# Patient Record
Sex: Male | Born: 1950
Health system: Southern US, Community
[De-identification: ages and names within clinical notes are randomized; demographics above are authoritative.]

## PROBLEM LIST (undated history)

## (undated) DIAGNOSIS — T451X5A Adverse effect of antineoplastic and immunosuppressive drugs, initial encounter: Secondary | ICD-10-CM

## (undated) DIAGNOSIS — E119 Type 2 diabetes mellitus without complications: Secondary | ICD-10-CM

## (undated) DIAGNOSIS — R590 Localized enlarged lymph nodes: Secondary | ICD-10-CM

## (undated) DIAGNOSIS — C801 Malignant (primary) neoplasm, unspecified: Secondary | ICD-10-CM

## (undated) DIAGNOSIS — Q632 Ectopic kidney: Secondary | ICD-10-CM

## (undated) DIAGNOSIS — Z794 Long term (current) use of insulin: Secondary | ICD-10-CM

## (undated) DIAGNOSIS — K219 Gastro-esophageal reflux disease without esophagitis: Secondary | ICD-10-CM

## (undated) DIAGNOSIS — Z973 Presence of spectacles and contact lenses: Secondary | ICD-10-CM

## (undated) DIAGNOSIS — M1990 Unspecified osteoarthritis, unspecified site: Secondary | ICD-10-CM

## (undated) DIAGNOSIS — I10 Essential (primary) hypertension: Secondary | ICD-10-CM

## (undated) DIAGNOSIS — R351 Nocturia: Secondary | ICD-10-CM

## (undated) DIAGNOSIS — R918 Other nonspecific abnormal finding of lung field: Secondary | ICD-10-CM

## (undated) HISTORY — DX: Type 2 diabetes mellitus without complications: E11.9

## (undated) HISTORY — PX: APPENDECTOMY: SHX54

---

## 1976-11-28 HISTORY — PX: APPENDECTOMY: SHX54

## 2001-06-18 ENCOUNTER — Emergency Department (HOSPITAL_COMMUNITY): Admission: EM | Admit: 2001-06-18 | Discharge: 2001-06-18 | Payer: Self-pay

## 2006-02-04 ENCOUNTER — Emergency Department (HOSPITAL_COMMUNITY): Admission: EM | Admit: 2006-02-04 | Discharge: 2006-02-04 | Payer: Self-pay | Admitting: Family Medicine

## 2006-02-18 ENCOUNTER — Emergency Department (HOSPITAL_COMMUNITY): Admission: EM | Admit: 2006-02-18 | Discharge: 2006-02-18 | Payer: Self-pay | Admitting: Emergency Medicine

## 2006-06-30 ENCOUNTER — Emergency Department (HOSPITAL_COMMUNITY): Admission: EM | Admit: 2006-06-30 | Discharge: 2006-07-01 | Payer: Self-pay | Admitting: Emergency Medicine

## 2009-10-21 ENCOUNTER — Emergency Department (HOSPITAL_COMMUNITY): Admission: EM | Admit: 2009-10-21 | Discharge: 2009-10-21 | Payer: Self-pay | Admitting: Emergency Medicine

## 2011-01-08 ENCOUNTER — Inpatient Hospital Stay (INDEPENDENT_AMBULATORY_CARE_PROVIDER_SITE_OTHER)
Admission: RE | Admit: 2011-01-08 | Discharge: 2011-01-08 | Disposition: A | Discharge status: Home / Self Care | Payer: Managed Care, Other (non HMO) | Source: Ambulatory Visit | Attending: Emergency Medicine | Admitting: Emergency Medicine

## 2011-01-08 DIAGNOSIS — J40 Bronchitis, not specified as acute or chronic: Secondary | ICD-10-CM

## 2011-01-08 DIAGNOSIS — R319 Hematuria, unspecified: Secondary | ICD-10-CM

## 2011-01-08 LAB — POCT URINALYSIS DIPSTICK
Nitrite: NEGATIVE
Urine Glucose, Fasting: NEGATIVE mg/dL
Urobilinogen, UA: 0.2 mg/dL (ref 0.0–1.0)
pH: 5.5 (ref 5.0–8.0)

## 2011-03-02 LAB — CBC
MCHC: 32.1 g/dL (ref 30.0–36.0)
MCV: 77 fL — ABNORMAL LOW (ref 78.0–100.0)
Platelets: 186 10*3/uL (ref 150–400)
WBC: 6.3 10*3/uL (ref 4.0–10.5)

## 2011-03-02 LAB — DIFFERENTIAL
Eosinophils Relative: 3 % (ref 0–5)
Lymphocytes Relative: 32 % (ref 12–46)
Lymphs Abs: 2 10*3/uL (ref 0.7–4.0)
Monocytes Absolute: 1.3 10*3/uL — ABNORMAL HIGH (ref 0.1–1.0)
Monocytes Relative: 20 % — ABNORMAL HIGH (ref 3–12)

## 2011-03-02 LAB — COMPREHENSIVE METABOLIC PANEL
AST: 35 U/L (ref 0–37)
Albumin: 3.6 g/dL (ref 3.5–5.2)
Calcium: 9 mg/dL (ref 8.4–10.5)
Creatinine, Ser: 1.16 mg/dL (ref 0.4–1.5)
GFR calc Af Amer: >60 mL/min (ref >60)

## 2011-03-02 LAB — LIPASE, BLOOD: Lipase: 127 U/L — ABNORMAL HIGH (ref 11–59)

## 2012-10-13 ENCOUNTER — Emergency Department (HOSPITAL_COMMUNITY): Payer: BC Managed Care – PPO

## 2012-10-13 ENCOUNTER — Encounter (HOSPITAL_COMMUNITY): Payer: Self-pay

## 2012-10-13 ENCOUNTER — Emergency Department (HOSPITAL_COMMUNITY)
Admission: EM | Admit: 2012-10-13 | Discharge: 2012-10-13 | Disposition: A | Discharge status: Home / Self Care | Payer: BC Managed Care – PPO | Attending: Emergency Medicine | Admitting: Emergency Medicine

## 2012-10-13 DIAGNOSIS — R51 Headache: Secondary | ICD-10-CM | POA: Insufficient documentation

## 2012-10-13 DIAGNOSIS — I10 Essential (primary) hypertension: Secondary | ICD-10-CM

## 2012-10-13 DIAGNOSIS — R0602 Shortness of breath: Secondary | ICD-10-CM | POA: Insufficient documentation

## 2012-10-13 DIAGNOSIS — M129 Arthropathy, unspecified: Secondary | ICD-10-CM | POA: Insufficient documentation

## 2012-10-13 HISTORY — DX: Essential (primary) hypertension: I10

## 2012-10-13 LAB — POCT I-STAT, CHEM 8
Creatinine, Ser: 1.3 mg/dL (ref 0.50–1.35)
Glucose, Bld: 105 mg/dL — ABNORMAL HIGH (ref 70–99)
Hemoglobin: 16.7 g/dL (ref 13.0–17.0)
Potassium: 4.1 mEq/L (ref 3.5–5.1)

## 2012-10-13 LAB — CBC WITH DIFFERENTIAL/PLATELET
Basophils Relative: 0 % (ref 0–1)
HCT: 45.5 % (ref 39.0–52.0)
Hemoglobin: 14.9 g/dL (ref 13.0–17.0)
MCH: 24.5 pg — ABNORMAL LOW (ref 26.0–34.0)
MCHC: 32.7 g/dL (ref 30.0–36.0)
Monocytes Absolute: 0.5 10*3/uL (ref 0.1–1.0)
Monocytes Relative: 9 % (ref 3–12)
Neutro Abs: 1.8 10*3/uL (ref 1.7–7.7)

## 2012-10-13 LAB — POCT I-STAT TROPONIN I: Troponin i, poc: 0 ng/mL (ref 0.00–0.08)

## 2012-10-13 MED ORDER — IOHEXOL 300 MG/ML  SOLN
75.0000 mL | Freq: Once | INTRAMUSCULAR | Status: AC | PRN
  Administered 2012-10-13: 75 mL via INTRAVENOUS

## 2012-10-13 MED ORDER — HYDROCHLOROTHIAZIDE 12.5 MG PO TABS: 12.5000 mg | ORAL_TABLET | Freq: Every day | ORAL | Status: DC

## 2012-10-13 MED ORDER — SODIUM CHLORIDE 0.9 % IV SOLN: INTRAVENOUS | Status: DC

## 2012-10-13 MED ORDER — SODIUM CHLORIDE 0.9 % IV BOLUS (SEPSIS)
500.0000 mL | Freq: Once | INTRAVENOUS | Status: AC
  Administered 2012-10-13: 500 mL via INTRAVENOUS

## 2012-10-13 NOTE — ED Provider Notes (Signed)
History     CSN: [id number]  Arrival date & time 10/13/12  0818   First MD Initiated Contact with Patient 10/13/12 [phone]      Chief Complaint  Patient presents with  . Hypertension    (Consider location/radiation/quality/duration/timing/severity/associated sxs/prior treatment) HPI This 61 year old male complains of 3 weeks of fluctuating blood pressure without history of hypertension with blood pressures running as high as the 160s over the low 100s, he was told last night to come to the emergency department so he presents today for evaluation. He states the last 3 weeks he has had mild intermittent gradual onset headaches without any change in speech or vision or focal weakness or numbness or vertigo or altered mental status. He also does have a last few weeks he feels short of breath in his neck area 24/7 without drooling or stridor or pain or dysphagia. He denies any chest pain or chest pressure. Denies abdominal pain. He denies waking or edema. He is no sudden or severe headaches.  He does not know if he has snoring or apnea at night but feels short of breath at daytime and nighttime 24 hours a day. He tried an old inhaler for when he had an episode of bronchospasm and that is really not changed his shortness of breath feeling. For shortness of breath does not feel as if it is in his chest area but more so in his neck area and is very mild and he is able to speak full sentences without difficulty. There is no treatment prior to arrival.  Past medical history: Arthritis  History reviewed. No pertinent past surgical history.  History reviewed. No pertinent family history.  History  Substance Use Topics  . Smoking status: Never Smoker   . Smokeless tobacco: Not on file  . Alcohol Use: Yes      Review of Systems 10 Systems reviewed and are negative for acute change except as noted in the HPI. Allergies  Quinine derivatives  Home Medications   Current Outpatient Rx  Name   Route  Sig  Dispense  Refill  . GLUCOSAMINE PO   Oral   Take 1 tablet by mouth every other day.         . IBUPROFEN 200 MG PO TABS   Oral   Take 600 mg by mouth every 6 (six) hours as needed. For pain         . HYDROCHLOROTHIAZIDE 12.5 MG PO TABS   Oral   Take 1 tablet (12.5 mg total) by mouth daily.   30 tablet   0     BP 134/87  Pulse 59  Temp 97.2 F (36.2 C) (Oral)  Resp 16  SpO2 100%  Physical Exam  Nursing note and vitals reviewed. Constitutional:       Awake, alert, nontoxic appearance with baseline speech for patient.  HENT:  Head: Atraumatic.  Mouth/Throat: Oropharynx is clear and moist. No oropharyngeal exudate.  Eyes: EOM are normal. Pupils are equal, round, and reactive to light. Right eye exhibits no discharge. Left eye exhibits no discharge.  Neck: Neck supple.       Patient's subjective feeling of shortness of breath he is in his neck area and he does not have inspiratory stridor but seems to have upper airway expiratory wheezes not in his chest but only heard from his oropharynx area.  There is no drooling and he is able to speak full sentences without difficulty. He has normal pulse oximetry on room air.  Cardiovascular:  Normal rate and regular rhythm.   No murmur heard. Pulmonary/Chest: Effort normal and breath sounds normal. No stridor. No respiratory distress. He has no wheezes. He has no rales. He exhibits no tenderness.  Abdominal: Soft. Bowel sounds are normal. He exhibits no mass. There is no tenderness. There is no rebound.  Musculoskeletal: He exhibits no tenderness.       Baseline ROM, moves extremities with no obvious new focal weakness.  Lymphadenopathy:    He has no cervical adenopathy.  Neurological: He is alert.       Awake, alert, cooperative and aware of situation; motor strength bilaterally; sensation normal to light touch bilaterally; peripheral visual fields full to confrontation; no facial asymmetry; tongue midline; major cranial  nerves appear intact; no pronator drift, normal finger to nose bilaterally  Skin: No rash noted.  Psychiatric: He has a normal mood and affect.    ED Course  Procedures (including critical care time)  ECG: Normal sinus rhythm, ventricular rate 61, normal axis, normal intervals, no acute ischemic changes noted, impression normal ECG, no comparison ECG available   I doubt hypertensive crisis although given his symptoms we will check CT scan brain to rule out bleed, CT scan neck to rule out obstructive lesion, labs and ECG, the patient was moved to the CDU and I anticipate discharge if his evaluation is unremarkable. Patient / Family / Caregiver understand and agree with initial ED impression and plan with expectations set for ED visit.  Medical screening examination/treatment/procedure(s) were conducted as a shared visit with non-physician practitioner(s) and myself.  I personally evaluated the patient during the encounter move to CDU. Labs Reviewed  CBC WITH DIFFERENTIAL - Abnormal; Notable for the following:    RBC 6.08 (*)     MCV 74.8 (*)     MCH 24.5 (*)     Platelets 149 (*)     Neutrophils Relative 33 (*)     Lymphocytes Relative 52 (*)     All other components within normal limits  POCT I-STAT, CHEM 8 - Abnormal; Notable for the following:    Glucose, Bld 105 (*)     All other components within normal limits  PRO B NATRIURETIC PEPTIDE  POCT I-STAT TROPONIN I  POCT I-STAT TROPONIN I   Dg Chest 2 View  10/13/2012  *RADIOLOGY REPORT*  Clinical Data: Headache for 2 weeks.  Hypertension.  CHEST - 2 VIEW  Comparison: 10/21/2009  Findings: The heart, mediastinum and hila are unremarkable.  The lungs are clear.  The bony thorax is intact.  No change from the prior study.  IMPRESSION: No active disease of the chest.   Original Report Authenticated By: Amie Portland, M.D.    Ct Head Wo Contrast  10/13/2012  *RADIOLOGY REPORT*  Clinical Data: Headache, hypertension  CT HEAD WITHOUT  CONTRAST  Technique:  Contiguous axial images were obtained from the base of the skull through the vertex without contrast.  Comparison: None.  Findings: No evidence of parenchymal hemorrhage or extra-axial fluid collection. No mass lesion, mass effect, or midline shift.  No CT evidence of acute infarction.  Mild subcortical white matter and periventricular small vessel ischemic changes.  Cerebral volume is age appropriate.  No ventriculomegaly.  The visualized paranasal sinuses are essentially clear. The mastoid air cells are unopacified.  No evidence of calvarial fracture.  IMPRESSION: No evidence of acute intracranial abnormality.   Original Report Authenticated By: Charline Bills, M.D.    Ct Soft Tissue Neck W Contrast  10/13/2012  *  RADIOLOGY REPORT*  Clinical Data: Dysphagia.  Sensation of something caught in throat.  CT NECK WITH CONTRAST  Technique:  Multidetector CT imaging of the neck was performed with intravenous contrast.  Contrast: 75mL OMNIPAQUE IOHEXOL 300 MG/ML  SOLN  Comparison: None.  Findings: Visualized lungs clear.  Calcification left coronary artery.  Minimal calcification left carotid bifurcation.  Mild cervical spondylotic changes with spinal stenosis most notable C3-4 where there is slight cord flattening.  Calcification palatine tonsils consistent with prior inflammation. No discrete palatine tonsil mass is identified.  If the patient has persistent symptoms, ENT evaluation to exclude mucosal abnormality may be considered.  Mild prominence soft palate probably normal.  No abnormality of the epiglottis, aryepiglottic folds or piriform sinus.  No glottic or subglottic lesion noted.  No thyroid, submandibular, parotid or tongue base mass identified.  Minimal mucosal thickening the left maxillary sinus.  Scattered normal sized lymph nodes.  No adenopathy.  Visualized aspect of the upper thoracic and cervical esophagus without mass or radiopaque foreign body.  IMPRESSION: No cause of  patient's symptoms detected.  Please see above discussion.  Left coronary artery calcification.  Spinal stenosis C3-4.   Original Report Authenticated By: Lacy Duverney, M.D.      1. Hypertension       MDM          Hurman Horn, MD 10/13/12 2159

## 2012-10-13 NOTE — ED Notes (Signed)
Pt reports blood pressure "going  Crazy"  Elevated, feeling woozy, and not himself

## 2012-10-13 NOTE — ED Notes (Signed)
Patient transported to X-ray 

## 2012-10-13 NOTE — ED Provider Notes (Signed)
1000  Report received from Dr. Carlos American B for this 61 yo here today c/o hypertension, SOB and h/a but no stroke symptoms.  PEARL, MAE=.  Coordination and speech in tact.  No SOB presently. O 2 sat 100% on r/a.  Lungs clear. Non toxic appearance.   1300 Labs unremarkable.  Trop - x 2.  EKg nsr with no changes.  CT head and neck normal and reviewed by myself.  Patient is ready for discharge.  Instructed to follow up with Dr. Candis Musa this week.  Rx for hydrochlorothizide 12.5 daily.  He will keep a log of b/p and take to appointment with Dr. Candis Musa.  Stop all salt intake and limit caffeine.  Patient and family agree with plan and are ready for discharge.   Labs Reviewed  CBC WITH DIFFERENTIAL - Abnormal; Notable for the following:    RBC 6.08 (*)     MCV 74.8 (*)     MCH 24.5 (*)     Platelets 149 (*)     Neutrophils Relative 33 (*)     Lymphocytes Relative 52 (*)     All other components within normal limits  POCT I-STAT, CHEM 8 - Abnormal; Notable for the following:    Glucose, Bld 105 (*)     All other components within normal limits  PRO B NATRIURETIC PEPTIDE  POCT I-STAT TROPONIN I  POCT I-STAT TROPONIN I  LAB REPORT - SCANNED    Date: 10/13/2012  Rate: 61  Rhythm: normal sinus rhythm  QRS Axis: normal  Intervals: normal  ST/T Wave abnormalities: normal  Conduction Disutrbances:none  Narrative Interpretation:   Old EKG Reviewed: unchanged   Remi Haggard, NP 10/14/12 1727

## 2015-05-18 ENCOUNTER — Emergency Department (INDEPENDENT_AMBULATORY_CARE_PROVIDER_SITE_OTHER)
Admission: EM | Admit: 2015-05-18 | Discharge: 2015-05-18 | Disposition: A | Discharge status: Home / Self Care | Payer: 59 | Source: Home / Self Care | Attending: Family Medicine | Admitting: Family Medicine

## 2015-05-18 ENCOUNTER — Encounter (HOSPITAL_COMMUNITY): Payer: Self-pay | Admitting: Emergency Medicine

## 2015-05-18 DIAGNOSIS — I10 Essential (primary) hypertension: Secondary | ICD-10-CM

## 2015-05-18 LAB — POCT I-STAT, CHEM 8
BUN: 10 mg/dL (ref 6–20)
CALCIUM ION: 1.15 mmol/L (ref 1.13–1.30)
CREATININE: 1.2 mg/dL (ref 0.61–1.24)
Chloride: 104 mmol/L (ref 101–111)
GLUCOSE: 91 mg/dL (ref 65–99)
HCT: 47 % (ref 39.0–52.0)
HEMOGLOBIN: 16 g/dL (ref 13.0–17.0)
Potassium: 3.6 mmol/L (ref 3.5–5.1)
Sodium: 140 mmol/L (ref 135–145)
TCO2: 24 mmol/L (ref 0–100)

## 2015-05-18 MED ORDER — LISINOPRIL-HYDROCHLOROTHIAZIDE 20-25 MG PO TABS: 1.0000 | ORAL_TABLET | Freq: Every day | ORAL | Status: DC

## 2015-05-18 NOTE — ED Provider Notes (Signed)
Linn Goetze is a 64 y.o. male who presents to Urgent Care today for blood pressure medication refill. Patient ran out of his blood pressure medicine about 4 days ago. His doctor is on medical leave and not responding to request for refill. He feels well with no chest pains palpitations or shortness of breath.   Past Medical History  Diagnosis Date  . Hypertension    History reviewed. No pertinent past surgical history. History  Substance Use Topics  . Smoking status: Never Smoker   . Smokeless tobacco: Not on file  . Alcohol Use: Yes   ROS as above Medications: No current facility-administered medications for this encounter.   Current Outpatient Prescriptions  Medication Sig Dispense Refill  . GLUCOSAMINE PO Take 1 tablet by mouth every other day.    . ibuprofen (ADVIL,MOTRIN) 200 MG tablet Take 600 mg by mouth every 6 (six) hours as needed. For pain    . lisinopril-hydrochlorothiazide (PRINZIDE,ZESTORETIC) 20-25 MG per tablet Take 1 tablet by mouth daily. 30 tablet 0  . [DISCONTINUED] hydrochlorothiazide (HYDRODIURIL) 12.5 MG tablet Take 1 tablet (12.5 mg total) by mouth daily. 30 tablet 0   Allergies  Allergen Reactions  . Quinine Derivatives Itching     Exam:  BP 140/85 mmHg  Pulse 74  Temp(Src) 98.5 F (36.9 C) (Oral)  Resp 16  SpO2 97% Gen: Well NAD HEENT: EOMI,  MMM Lungs: Normal work of breathing. CTABL Heart: RRR no MRG Abd: NABS, Soft. Nondistended, Nontender Exts: Brisk capillary refill, warm and well perfused.   Results for orders placed or performed during the hospital encounter of 05/18/15 (from the past 24 hour(s))  I-STAT, chem 8     Status: None   Collection Time: 05/18/15  5:44 PM  Result Value Ref Range   Sodium 140 135 - 145 mmol/L   Potassium 3.6 3.5 - 5.1 mmol/L   Chloride 104 101 - 111 mmol/L   BUN 10 6 - 20 mg/dL   Creatinine, Ser 1.20 0.61 - 1.24 mg/dL   Glucose, Bld 91 65 - 99 mg/dL   Calcium, Ion 1.15 1.13 - 1.30 mmol/L   TCO2 24 0  - 100 mmol/L   Hemoglobin 16.0 13.0 - 17.0 g/dL   HCT 47.0 39.0 - 52.0 %   No results found.  Assessment and Plan: 64 y.o. male with hypertension. Refill hydrochlorothiazide/lisinopril. Follow-up with PCP.  Discussed warning signs or symptoms. Please see discharge instructions. Patient expresses understanding.     Gregor Hams, MD 05/18/15 [phone]

## 2015-05-18 NOTE — Discharge Instructions (Signed)
Thank you for coming in today. Call or go to the emergency room if you get worse, have trouble breathing, have chest pains, or palpitations.  Follow up with your doctor.    Hypertension Hypertension, commonly called high blood pressure, is when the force of blood pumping through your arteries is too strong. Your arteries are the blood vessels that carry blood from your heart throughout your body. A blood pressure reading consists of a higher number over a lower number, such as 110/72. The higher number (systolic) is the pressure inside your arteries when your heart pumps. The lower number (diastolic) is the pressure inside your arteries when your heart relaxes. Ideally you want your blood pressure below 120/80. Hypertension forces your heart to work harder to pump blood. Your arteries may become narrow or stiff. Having hypertension puts you at risk for heart disease, stroke, and other problems.  RISK FACTORS Some risk factors for high blood pressure are controllable. Others are not.  Risk factors you cannot control include:   Race. You may be at higher risk if you are African American.  Age. Risk increases with age.  Gender. Men are at higher risk than women before age 56 years. After age 52, women are at higher risk than men. Risk factors you can control include:  Not getting enough exercise or physical activity.  Being overweight.  Getting too much fat, sugar, calories, or salt in your diet.  Drinking too much alcohol. SIGNS AND SYMPTOMS Hypertension does not usually cause signs or symptoms. Extremely high blood pressure (hypertensive crisis) may cause headache, anxiety, shortness of breath, and nosebleed. DIAGNOSIS  To check if you have hypertension, your health care provider will measure your blood pressure while you are seated, with your arm held at the level of your heart. It should be measured at least twice using the same arm. Certain conditions can cause a difference in blood  pressure between your right and left arms. A blood pressure reading that is higher than normal on one occasion does not mean that you need treatment. If one blood pressure reading is high, ask your health care provider about having it checked again. TREATMENT  Treating high blood pressure includes making lifestyle changes and possibly taking medicine. Living a healthy lifestyle can help lower high blood pressure. You may need to change some of your habits. Lifestyle changes may include:  Following the DASH diet. This diet is high in fruits, vegetables, and whole grains. It is low in salt, red meat, and added sugars.  Getting at least 2 hours of brisk physical activity every week.  Losing weight if necessary.  Not smoking.  Limiting alcoholic beverages.  Learning ways to reduce stress. If lifestyle changes are not enough to get your blood pressure under control, your health care provider may prescribe medicine. You may need to take more than one. Work closely with your health care provider to understand the risks and benefits. HOME CARE INSTRUCTIONS  Have your blood pressure rechecked as directed by your health care provider.   Take medicines only as directed by your health care provider. Follow the directions carefully. Blood pressure medicines must be taken as prescribed. The medicine does not work as well when you skip doses. Skipping doses also puts you at risk for problems.   Do not smoke.   Monitor your blood pressure at home as directed by your health care provider. SEEK MEDICAL CARE IF:   You think you are having a reaction to medicines taken.  You have recurrent headaches or feel dizzy.  You have swelling in your ankles.  You have trouble with your vision. SEEK IMMEDIATE MEDICAL CARE IF:  You develop a severe headache or confusion.  You have unusual weakness, numbness, or feel faint.  You have severe chest or abdominal pain.  You vomit repeatedly.  You have  trouble breathing. MAKE SURE YOU:   Understand these instructions.  Will watch your condition.  Will get help right away if you are not doing well or get worse. Document Released: 11/14/2005 Document Revised: 03/31/2014 Document Reviewed: 09/06/2013 [facility] Patient Information 2015 [place], [place]. This information is not intended to replace advice given to you by your health care provider. Make sure you discuss any questions you have with your health care provider.

## 2015-05-18 NOTE — ED Notes (Signed)
Pt has been out of his BP medication since last Thursday.  Pt's PCP is out on medical leave and he is not able to fill his Rx at this time.

## 2015-08-10 ENCOUNTER — Emergency Department (INDEPENDENT_AMBULATORY_CARE_PROVIDER_SITE_OTHER)
Admission: EM | Admit: 2015-08-10 | Discharge: 2015-08-10 | Disposition: A | Discharge status: Home / Self Care | Payer: 59 | Source: Home / Self Care | Attending: Family Medicine | Admitting: Family Medicine

## 2015-08-10 ENCOUNTER — Encounter (HOSPITAL_COMMUNITY): Payer: Self-pay | Admitting: Emergency Medicine

## 2015-08-10 DIAGNOSIS — I10 Essential (primary) hypertension: Secondary | ICD-10-CM | POA: Diagnosis not present

## 2015-08-10 MED ORDER — LISINOPRIL-HYDROCHLOROTHIAZIDE 20-25 MG PO TABS: 1.0000 | ORAL_TABLET | Freq: Every day | ORAL | Status: DC

## 2015-08-10 NOTE — ED Notes (Signed)
Pt is currently out of his BP medication and in between doctors.  He has been out of his medication for 3 weeks.  Pt reports no issues.

## 2015-08-10 NOTE — Discharge Instructions (Signed)
Refilled her blood pressure medicine. Please follow-up with [facility] [phone]) family medicine hearing [place] or at [facility] [place] urgent [facility] if you need further medical care. He can always go to emergency room as well if needed.

## 2015-08-10 NOTE — ED Provider Notes (Addendum)
CSN: [id number]     Arrival date & time 08/10/15  1310 History   First MD Initiated Contact with Patient 08/10/15 1417     Chief Complaint  Patient presents with  . Medication Refill   (Consider location/radiation/quality/duration/timing/severity/associated sxs/prior Treatment) HPI  HTN: chronic. On BP medications but ran out 3 wks ago. Denies CP, SOB, palpitations, nausea, vomiting, Neck stiffness, HA. Patient states that his primary care doctor initially had to leave do to personal medical reasons and then try calling again the other day and his staff said that he was now no longer coming back but had decided to leave permanently. He no longer has a primary care physician. BP check the other day at home was 159/99.  Problem is constant   Past Medical History  Diagnosis Date  . Hypertension    History reviewed. No pertinent past surgical history. History reviewed. No pertinent family history. Social History  Substance Use Topics  . Smoking status: Never Smoker   . Smokeless tobacco: None  . Alcohol Use: Yes    Review of Systems Per HPI with all other pertinent systems negative.   Allergies  Quinine derivatives  Home Medications   Prior to Admission medications   Medication Sig Start Date End Date Taking? Authorizing Provider  GLUCOSAMINE PO Take 1 tablet by mouth every other day.    Historical Provider, MD  ibuprofen (ADVIL,MOTRIN) 200 MG tablet Take 600 mg by mouth every 6 (six) hours as needed. For pain    Historical Provider, MD  lisinopril-hydrochlorothiazide (PRINZIDE,ZESTORETIC) 20-25 MG per tablet Take 1 tablet by mouth daily. 08/10/15   Waldemar Dickens, MD   Meds Ordered and Administered this Visit  Medications - No data to display  BP 130/85 mmHg  Pulse 76  Temp(Src) 98.4 F (36.9 C) (Oral)  Resp 24  SpO2 99% No data found.   Physical Exam Physical Exam  Constitutional: oriented to person, place, and time. appears well-developed and well-nourished. No  distress.  HENT:  Head: Normocephalic and atraumatic.  Eyes: EOMI. PERRL.  Neck: Normal range of motion.  Cardiovascular: RRR, no m/r/g, 2+ distal pulses,  Pulmonary/Chest: Effort normal and breath sounds normal. No respiratory distress.  Abdominal: Soft. Bowel sounds are normal. NonTTP, no distension.  Musculoskeletal: Normal range of motion. Non ttp, no effusion.  Neurological: alert and oriented to person, place, and time.  Skin: Skin is warm. No rash noted. non diaphoretic.  Psychiatric: normal mood and affect. behavior is normal. Judgment and thought content normal. '  ED Course  Procedures (including critical care time)  Labs Review Labs Reviewed - No data to display  Imaging Review No results found.   Visual Acuity Review  Right Eye Distance:   Left Eye Distance:   Bilateral Distance:    Right Eye Near:   Left Eye Near:    Bilateral Near:         MDM   1. Essential hypertension    Refills for lisinopril and HCTZ   Patient follow-up with [facility] primary care or [place]  Waldemar Dickens, MD 08/10/15 [place], MD 08/10/15 1431

## 2015-12-29 ENCOUNTER — Encounter: Payer: 59 | Attending: Internal Medicine

## 2015-12-29 VITALS — Ht 69.0 in | Wt 255.0 lb

## 2015-12-29 DIAGNOSIS — E119 Type 2 diabetes mellitus without complications: Secondary | ICD-10-CM

## 2015-12-29 DIAGNOSIS — E1165 Type 2 diabetes mellitus with hyperglycemia: Secondary | ICD-10-CM | POA: Insufficient documentation

## 2016-01-01 NOTE — Progress Notes (Signed)
Patient was seen on 12/29/15 for the first of a series of three diabetes self-management courses at the [facility].  Patient Education Plan per assessed needs and concerns is to attend four course education program for Diabetes Self Management Education.  The following learning objectives were met by the patient during this class:  Describe diabetes  State some common risk factors for diabetes  Defines the role of glucose and insulin  Identifies type of diabetes and pathophysiology  Describe the relationship between diabetes and cardiovascular risk  State the members of the Healthcare Team  States the rationale for glucose monitoring  State when to test glucose  State their individual Target Range  State the importance of logging glucose readings  Describe how to interpret glucose readings  Identifies A1C target  Explain the correlation between A1c and eAG values  State symptoms and treatment of high blood glucose  State symptoms and treatment of low blood glucose  Explain proper technique for glucose testing  Identifies proper sharps disposal  Handouts given during class include:  Living Well with Diabetes book  Carb Counting and Meal Planning book  Meal Plan Card  Carbohydrate guide  Meal planning worksheet  Low Sodium Flavoring Tips  The diabetes portion plate  V9D to eAG Conversion Chart  Diabetes Medications  Diabetes Recommended Care Schedule  Support Group  Diabetes Success Plan  Core Class Satisfaction Survey  Follow-Up Plan:  Attend core 2

## 2016-01-05 ENCOUNTER — Encounter: Payer: 59 | Attending: Internal Medicine

## 2016-01-05 DIAGNOSIS — E119 Type 2 diabetes mellitus without complications: Secondary | ICD-10-CM

## 2016-01-05 DIAGNOSIS — E1165 Type 2 diabetes mellitus with hyperglycemia: Secondary | ICD-10-CM | POA: Diagnosis not present

## 2016-01-05 NOTE — Progress Notes (Signed)

## 2016-01-12 DIAGNOSIS — E1165 Type 2 diabetes mellitus with hyperglycemia: Secondary | ICD-10-CM | POA: Diagnosis not present

## 2016-01-12 DIAGNOSIS — E119 Type 2 diabetes mellitus without complications: Secondary | ICD-10-CM

## 2016-01-14 NOTE — Progress Notes (Signed)
Patient was seen on 01/12/16 for the third of a series of three diabetes self-management courses at the [facility].   [place] the amount of activity recommended for healthy living . Describe activities suitable for individual needs . Identify ways to regularly incorporate activity into daily life . Identify barriers to activity and ways to over come these barriers  Identify diabetes medications being personally used and their primary action for lowering glucose and possible side effects . Describe role of stress on blood glucose and develop strategies to address psychosocial issues . Identify diabetes complications and ways to prevent them  Explain how to manage diabetes during illness . Evaluate success in meeting personal goal . Establish 2-3 goals that they will plan to diligently work on until they return for the  57-month follow-up visit  Goals:   I will count my carb choices at most meals and snacks  I will be active 30 minutes or more 5 times a week  I will take my diabetes medications as scheduled  I will eat less unhealthy fats   I will test my glucose at least 1 times a day, 7 days a week  I will look at patterns in my record book at least 1 days a month  To help manage stress I will  Exercise  at least 5 times a week  Your patient has identified these potential barriers to change:  Stress  Your patient has identified their diabetes self-care support plan as  [employer] Support Group Plan:  Attend Optional Core 4 in 4 months

## 2016-09-20 ENCOUNTER — Ambulatory Visit
Admission: RE | Admit: 2016-09-20 | Discharge: 2016-09-20 | Disposition: A | Discharge status: Home / Self Care | Payer: 59 | Source: Ambulatory Visit | Attending: Internal Medicine | Admitting: Internal Medicine

## 2016-09-20 ENCOUNTER — Other Ambulatory Visit: Payer: Self-pay | Admitting: Internal Medicine

## 2016-09-20 DIAGNOSIS — R05 Cough: Secondary | ICD-10-CM

## 2016-09-20 DIAGNOSIS — R059 Cough, unspecified: Secondary | ICD-10-CM

## 2016-10-28 ENCOUNTER — Other Ambulatory Visit: Payer: Self-pay | Admitting: Gastroenterology

## 2016-11-28 DIAGNOSIS — C187 Malignant neoplasm of sigmoid colon: Secondary | ICD-10-CM

## 2016-11-28 HISTORY — DX: Malignant neoplasm of sigmoid colon: C18.7

## 2016-12-15 ENCOUNTER — Encounter (HOSPITAL_COMMUNITY): Payer: Self-pay

## 2016-12-19 ENCOUNTER — Ambulatory Visit (HOSPITAL_COMMUNITY)
Admission: RE | Admit: 2016-12-19 | Discharge: 2016-12-19 | Disposition: A | Discharge status: Home / Self Care | Payer: 59 | Source: Ambulatory Visit | Attending: Gastroenterology | Admitting: Gastroenterology

## 2016-12-19 ENCOUNTER — Ambulatory Visit (HOSPITAL_COMMUNITY): Payer: 59 | Admitting: Anesthesiology

## 2016-12-19 ENCOUNTER — Encounter (HOSPITAL_COMMUNITY)
Admission: RE | Disposition: A | Discharge status: Home / Self Care | Payer: Self-pay | Source: Ambulatory Visit | Attending: Gastroenterology

## 2016-12-19 ENCOUNTER — Encounter (HOSPITAL_COMMUNITY): Payer: Self-pay | Admitting: *Deleted

## 2016-12-19 DIAGNOSIS — C186 Malignant neoplasm of descending colon: Secondary | ICD-10-CM | POA: Insufficient documentation

## 2016-12-19 DIAGNOSIS — K219 Gastro-esophageal reflux disease without esophagitis: Secondary | ICD-10-CM | POA: Insufficient documentation

## 2016-12-19 DIAGNOSIS — D125 Benign neoplasm of sigmoid colon: Secondary | ICD-10-CM | POA: Insufficient documentation

## 2016-12-19 DIAGNOSIS — I10 Essential (primary) hypertension: Secondary | ICD-10-CM | POA: Diagnosis not present

## 2016-12-19 DIAGNOSIS — Z6838 Body mass index (BMI) 38.0-38.9, adult: Secondary | ICD-10-CM | POA: Diagnosis not present

## 2016-12-19 DIAGNOSIS — Z1211 Encounter for screening for malignant neoplasm of colon: Secondary | ICD-10-CM | POA: Diagnosis not present

## 2016-12-19 DIAGNOSIS — Z9049 Acquired absence of other specified parts of digestive tract: Secondary | ICD-10-CM | POA: Insufficient documentation

## 2016-12-19 DIAGNOSIS — E119 Type 2 diabetes mellitus without complications: Secondary | ICD-10-CM | POA: Diagnosis not present

## 2016-12-19 HISTORY — PX: COLONOSCOPY WITH PROPOFOL: SHX5780

## 2016-12-19 LAB — GLUCOSE, CAPILLARY: Glucose-Capillary: 132 mg/dL — ABNORMAL HIGH (ref 65–99)

## 2016-12-19 SURGERY — COLONOSCOPY WITH PROPOFOL
Anesthesia: Monitor Anesthesia Care

## 2016-12-19 MED ORDER — LIDOCAINE 2% (20 MG/ML) 5 ML SYRINGE
INTRAMUSCULAR | Status: AC
  Filled 2016-12-19: qty 5

## 2016-12-19 MED ORDER — PROPOFOL 10 MG/ML IV BOLUS
INTRAVENOUS | Status: AC
  Filled 2016-12-19: qty 20

## 2016-12-19 MED ORDER — SODIUM CHLORIDE 0.9 % IV SOLN: INTRAVENOUS | Status: DC

## 2016-12-19 MED ORDER — PROPOFOL 500 MG/50ML IV EMUL
INTRAVENOUS | Status: DC | PRN
  Administered 2016-12-19: 140 ug/kg/min via INTRAVENOUS

## 2016-12-19 MED ORDER — LACTATED RINGERS IV SOLN
INTRAVENOUS | Status: DC
Start: 2016-12-19 — End: 2016-12-19
  Administered 2016-12-19: 11:00:00 via INTRAVENOUS

## 2016-12-19 MED ORDER — PROPOFOL 10 MG/ML IV BOLUS
INTRAVENOUS | Status: AC
  Filled 2016-12-19: qty 40

## 2016-12-19 MED ORDER — PROPOFOL 10 MG/ML IV BOLUS
INTRAVENOUS | Status: DC | PRN
  Administered 2016-12-19 (×2): 20 mg via INTRAVENOUS

## 2016-12-19 SURGICAL SUPPLY — 21 items

## 2016-12-19 NOTE — Anesthesia Preprocedure Evaluation (Addendum)
Anesthesia Evaluation  Patient identified by MRN, date of birth, ID band Patient awake    Reviewed: Allergy & Precautions, NPO status , Patient's Chart, lab work & pertinent test results  History of Anesthesia Complications Negative for: history of anesthetic complications  Airway Mallampati: II  TM Distance: >3 FB Neck ROM: Full    Dental no notable dental hx. (+) Dental Advisory Given   Pulmonary neg pulmonary ROS,    Pulmonary exam normal        Cardiovascular hypertension, Pt. on medications Normal cardiovascular exam     Neuro/Psych negative neurological ROS  negative psych ROS   GI/Hepatic negative GI ROS, Neg liver ROS,   Endo/Other  diabetesMorbid obesity  Renal/GU negative Renal ROS  negative genitourinary   Musculoskeletal negative musculoskeletal ROS (+)   Abdominal   Peds negative pediatric ROS (+)  Hematology negative hematology ROS (+)   Anesthesia Other Findings   Reproductive/Obstetrics negative OB ROS                            Anesthesia Physical Anesthesia Plan  ASA: III  Anesthesia Plan: MAC   Post-op Pain Management:    Induction:   Airway Management Planned: Natural Airway and Simple Face Mask  Additional Equipment:   Intra-op Plan:   Post-operative Plan:   Informed Consent: I have reviewed the patients History and Physical, chart, labs and discussed the procedure including the risks, benefits and alternatives for the proposed anesthesia with the patient or authorized representative who has indicated his/her understanding and acceptance.   Dental advisory given  Plan Discussed with: CRNA, Anesthesiologist and Surgeon  Anesthesia Plan Comments:        Anesthesia Quick Evaluation

## 2016-12-19 NOTE — Discharge Instructions (Signed)
Colonoscopy, Adult, Care After  This sheet gives you information about how to care for yourself after your procedure. Your health care provider may also give you more specific instructions. If you have problems or questions, contact your health care provider.  What can I expect after the procedure?  After the procedure, it is common to have:  · A small amount of blood in your stool for 24 hours after the procedure.  · Some gas.  · Mild abdominal cramping or bloating.    Follow these instructions at home:  General instructions     · For the first 24 hours after the procedure:  ? Do not drive or use machinery.  ? Do not sign important documents.  ? Do not drink alcohol.  ? Do your regular daily activities at a slower pace than normal.  ? Eat soft, easy-to-digest foods.  ? Rest often.  · Take over-the-counter or prescription medicines only as told by your health care provider.  · It is up to you to get the results of your procedure. Ask your health care provider, or the department performing the procedure, when your results will be ready.  Relieving cramping and bloating   · Try walking around when you have cramps or feel bloated.  · Apply heat to your abdomen as told by your health care provider. Use a heat source that your health care provider recommends, such as a moist heat pack or a heating pad.  ? Place a towel between your skin and the heat source.  ? Leave the heat on for 20-30 minutes.  ? Remove the heat if your skin turns bright red. This is especially important if you are unable to feel pain, heat, or cold. You may have a greater risk of getting burned.  Eating and drinking   · Drink enough fluid to keep your urine clear or pale yellow.  · Resume your normal diet as instructed by your health care provider. Avoid heavy or fried foods that are hard to digest.  · Avoid drinking alcohol for as long as instructed by your health care provider.  Contact a health care provider if:  · You have blood in your stool 2-3  days after the procedure.  Get help right away if:  · You have more than a small spotting of blood in your stool.  · You pass large blood clots in your stool.  · Your abdomen is swollen.  · You have nausea or vomiting.  · You have a fever.  · You have increasing abdominal pain that is not relieved with medicine.  This information is not intended to replace advice given to you by your health care provider. Make sure you discuss any questions you have with your health care provider.  Document Released: 06/28/2004 Document Revised: 08/08/2016 Document Reviewed: 01/26/2016  Elsevier Interactive Patient Education © 2017 [employer]

## 2016-12-19 NOTE — H&P (Signed)
Procedure: Screening colonoscopy  History: The patient is a 66 year old male born [birth year]. He is scheduled to undergo a screening colonoscopy today.  Past medical history: Appendectomy. Type 2 diabetes mellitus. Hypertension. Gastroesophageal reflux.  Exam: The patient is alert and lying comfortably on the endoscopy stretcher. Abdomen is soft and nontender to palpation. Lungs are clear to auscultation. Cardiac exam reveals a regular rhythm.  Plan: Proceed with screening colonoscopy

## 2016-12-19 NOTE — Transfer of Care (Signed)
Immediate Anesthesia Transfer of Care Note  Patient: Angel [name]  Procedure(s) Performed: Procedure(s): COLONOSCOPY WITH PROPOFOL (N/A)  Patient Location: Endoscopy Unit  Anesthesia Type:MAC  Level of Consciousness: awake, alert  and oriented  Airway & Oxygen Therapy: Patient Spontanous Breathing  Post-op Assessment: Report given to RN and Post -op Vital signs reviewed and stable  Post vital signs: Reviewed and stable  Last Vitals:  Vitals:   12/19/16 1055  BP: (!) 152/78  Pulse: 67  Resp: 18  Temp: 36.7 C    Last Pain:  Vitals:   12/19/16 1055  TempSrc: Oral         Complications: No apparent anesthesia complications

## 2016-12-19 NOTE — Anesthesia Postprocedure Evaluation (Addendum)
Anesthesia Post Note  Patient: Angel [name]  Procedure(s) Performed: Procedure(s) (LRB): COLONOSCOPY WITH PROPOFOL (N/A)  Patient location during evaluation: Endoscopy Anesthesia Type: MAC Level of consciousness: awake and alert Pain management: pain level controlled Vital Signs Assessment: post-procedure vital signs reviewed and stable Respiratory status: spontaneous breathing and respiratory function stable Cardiovascular status: stable Anesthetic complications: no       Last Vitals:  Vitals:   12/19/16 1235 12/19/16 1236  BP:  (!) 148/107  Pulse: 69   Resp: 20   Temp: 36.7 C     Last Pain:  Vitals:   12/19/16 1235  TempSrc: Oral                 Faduma Cho DANIEL

## 2016-12-19 NOTE — Op Note (Addendum)
[facility] Patient Name: Angel [name] Procedure Date: 12/19/2016 MRN: [id number] Attending MD: Garlan Fair , MD Date of Birth: [birth year] CSN: [id number] Age: 66 Admit Type: Outpatient Procedure:                Colonoscopy Indications:              Screening for colorectal malignant neoplasm Providers:                Garlan Fair, MD Referring MD:              Medicines:                Propofol per Anesthesia Complications:            No immediate complications. Estimated Blood Loss:     Estimated blood loss: none. Procedure:                Pre-Anesthesia Assessment:                           - Prior to the procedure, a History and Physical                            was performed, and patient medications and                            allergies were reviewed. The patient's tolerance of                            previous anesthesia was also reviewed. The risks                            and benefits of the procedure and the sedation                            options and risks were discussed with the patient.                            All questions were answered, and informed consent                            was obtained. Prior Anticoagulants: The patient has                            taken aspirin, last dose was 1 day prior to                            procedure. ASA Grade Assessment: II - A patient                            with mild systemic disease. After reviewing the                            risks and benefits, the patient was deemed in  satisfactory condition to undergo the procedure.                           After obtaining informed consent, the colonoscope                            was passed under direct vision. Throughout the                            procedure, the patient's blood pressure, pulse, and                            oxygen saturations were monitored continuously. The   [id number] scope was introduced through                            the anus and advanced to the the cecum, identified                            by appendiceal orifice and ileocecal valve. The                            colonoscopy was technically difficult and complex                            due to significant looping. The patient tolerated                            the procedure well. The quality of the bowel                            preparation was good. The appendiceal orifice and                            the rectum were photographed. Scope In: 11:44:20 AM Scope Out: 12:28:42 PM Scope Withdrawal Time: 0 hours 28 minutes 9 seconds  Total Procedure Duration: 0 hours 44 minutes 22 seconds  Findings:      The perianal and digital rectal examinations were normal.      A 10 mm polyp was found in the mid sigmoid colon. The polyp was sessile.       The polyp was removed with a piecemeal technique using a hot snare.       Resection and retrieval were complete.      A 5 mm polyp was found in the distal sigmoid colon. The polyp was       sessile. The polyp was removed with a cold snare. Resection and       retrieval were complete.      A 5 mm polyp was found in the mid descending colon. The polyp was       sessile. The polyp was removed with a cold snare. Resection and       retrieval were complete.      The exam was otherwise without abnormality. Impression:               - One 10 mm polyp in the mid sigmoid  colon, removed                            piecemeal using a hot snare. Resected and retrieved.                           - One 5 mm polyp in the distal sigmoid colon,                            removed with a cold snare. Resected and retrieved.                           - One 5 mm polyp in the mid descending colon,                            removed with a cold snare. Resected and retrieved.                           - The examination was otherwise normal. Moderate  Sedation:      N/A- Per Anesthesia Care Recommendation:           - Patient has a contact number available for                            emergencies. The signs and symptoms of potential                            delayed complications were discussed with the                            patient. Return to normal activities tomorrow.                            Written discharge instructions were provided to the                            patient.                           - Repeat colonoscopy date to be determined after                            pending pathology results are reviewed for                            surveillance.                           - Resume previous diet.                           - Continue present medications. Procedure Code(s):        --- Professional ---                           [phone], Colonoscopy, flexible;  with removal of                            tumor(s), polyp(s), or other lesion(s) by snare                            technique Diagnosis Code(s):        --- Professional ---                           Z12.11, Encounter for screening for malignant                            neoplasm of colon                           D12.5, Benign neoplasm of sigmoid colon                           D12.4, Benign neoplasm of descending colon CPT copyright 2016 American Medical Association. All rights reserved. The codes documented in this report are preliminary and upon coder review may  be revised to meet current compliance requirements. Earle Gell, MD Garlan Fair, MD 12/19/2016 12:52:47 PM This report has been signed electronically. Number of Addenda: 0

## 2016-12-20 ENCOUNTER — Ambulatory Visit (HOSPITAL_COMMUNITY)
Admission: RE | Admit: 2016-12-20 | Discharge: 2016-12-20 | Disposition: A | Discharge status: Home / Self Care | Payer: 59 | Source: Ambulatory Visit | Attending: Gastroenterology | Admitting: Gastroenterology

## 2016-12-20 ENCOUNTER — Other Ambulatory Visit (HOSPITAL_COMMUNITY): Payer: Self-pay | Admitting: Gastroenterology

## 2016-12-20 ENCOUNTER — Encounter (HOSPITAL_COMMUNITY)
Admission: RE | Disposition: A | Discharge status: Home / Self Care | Payer: Self-pay | Source: Ambulatory Visit | Attending: Gastroenterology

## 2016-12-20 ENCOUNTER — Encounter (HOSPITAL_COMMUNITY): Payer: Self-pay | Admitting: Gastroenterology

## 2016-12-20 DIAGNOSIS — Z7984 Long term (current) use of oral hypoglycemic drugs: Secondary | ICD-10-CM | POA: Insufficient documentation

## 2016-12-20 DIAGNOSIS — C187 Malignant neoplasm of sigmoid colon: Secondary | ICD-10-CM | POA: Diagnosis present

## 2016-12-20 DIAGNOSIS — Z79899 Other long term (current) drug therapy: Secondary | ICD-10-CM | POA: Insufficient documentation

## 2016-12-20 DIAGNOSIS — K219 Gastro-esophageal reflux disease without esophagitis: Secondary | ICD-10-CM | POA: Insufficient documentation

## 2016-12-20 DIAGNOSIS — E119 Type 2 diabetes mellitus without complications: Secondary | ICD-10-CM | POA: Diagnosis not present

## 2016-12-20 DIAGNOSIS — I10 Essential (primary) hypertension: Secondary | ICD-10-CM | POA: Diagnosis not present

## 2016-12-20 DIAGNOSIS — Z9049 Acquired absence of other specified parts of digestive tract: Secondary | ICD-10-CM | POA: Insufficient documentation

## 2016-12-20 HISTORY — PX: FLEXIBLE SIGMOIDOSCOPY: SHX5431

## 2016-12-20 LAB — CBC WITH DIFFERENTIAL/PLATELET
Basophils Absolute: 0 10*3/uL (ref 0.0–0.1)
Basophils Relative: 0 %
EOS ABS: 0.2 10*3/uL (ref 0.0–0.7)
EOS PCT: 3 %
HCT: 42 % (ref 39.0–52.0)
Hemoglobin: 13.3 g/dL (ref 13.0–17.0)
LYMPHS ABS: 2.7 10*3/uL (ref 0.7–4.0)
LYMPHS PCT: 53 %
MCH: 23.6 pg — AB (ref 26.0–34.0)
MCHC: 31.7 g/dL (ref 30.0–36.0)
MCV: 74.5 fL — AB (ref 78.0–100.0)
MONO ABS: 0.5 10*3/uL (ref 0.1–1.0)
MONOS PCT: 9 %
Neutro Abs: 1.9 10*3/uL (ref 1.7–7.7)
Neutrophils Relative %: 35 %
PLATELETS: 149 10*3/uL — AB (ref 150–400)
RBC: 5.64 MIL/uL (ref 4.22–5.81)
RDW: 15.3 % (ref 11.5–15.5)
WBC: 5.2 10*3/uL (ref 4.0–10.5)

## 2016-12-20 LAB — COMPREHENSIVE METABOLIC PANEL
ALT: 59 U/L (ref 17–63)
ANION GAP: 4 — AB (ref 5–15)
AST: 44 U/L — ABNORMAL HIGH (ref 15–41)
Albumin: 3.8 g/dL (ref 3.5–5.0)
Alkaline Phosphatase: 78 U/L (ref 38–126)
BUN: 13 mg/dL (ref 6–20)
CALCIUM: 8.4 mg/dL — AB (ref 8.9–10.3)
CHLORIDE: 107 mmol/L (ref 101–111)
CO2: 28 mmol/L (ref 22–32)
CREATININE: 1.2 mg/dL (ref 0.61–1.24)
GFR calc non Af Amer: >60 mL/min (ref >60)
Glucose, Bld: 137 mg/dL — ABNORMAL HIGH (ref 65–99)
Potassium: 4.1 mmol/L (ref 3.5–5.1)
SODIUM: 139 mmol/L (ref 135–145)
Total Bilirubin: 0.7 mg/dL (ref 0.3–1.2)
Total Protein: 6.5 g/dL (ref 6.5–8.1)

## 2016-12-20 LAB — GLUCOSE, CAPILLARY: Glucose-Capillary: 157 mg/dL — ABNORMAL HIGH (ref 65–99)

## 2016-12-20 SURGERY — SIGMOIDOSCOPY, FLEXIBLE
Anesthesia: Moderate Sedation

## 2016-12-20 MED ORDER — SPOT INK MARKER SYRINGE KIT
PACK | SUBMUCOSAL | Status: AC
  Filled 2016-12-20: qty 5

## 2016-12-20 MED ORDER — MIDAZOLAM HCL 10 MG/2ML IJ SOLN
INTRAMUSCULAR | Status: DC | PRN
  Administered 2016-12-20 (×2): 2.5 mg via INTRAVENOUS

## 2016-12-20 MED ORDER — MIDAZOLAM HCL 5 MG/ML IJ SOLN
INTRAMUSCULAR | Status: AC
  Filled 2016-12-20: qty 2

## 2016-12-20 MED ORDER — SPOT INK MARKER SYRINGE KIT
PACK | SUBMUCOSAL | Status: DC | PRN
  Administered 2016-12-20: 5 mL via SUBMUCOSAL

## 2016-12-20 MED ORDER — FENTANYL CITRATE (PF) 100 MCG/2ML IJ SOLN
INTRAMUSCULAR | Status: AC
  Filled 2016-12-20: qty 2

## 2016-12-20 MED ORDER — FENTANYL CITRATE (PF) 100 MCG/2ML IJ SOLN
INTRAMUSCULAR | Status: DC | PRN
  Administered 2016-12-20: 25 ug via INTRAVENOUS
  Administered 2016-12-20: 50 ug via INTRAVENOUS

## 2016-12-20 NOTE — Discharge Instructions (Signed)

## 2016-12-20 NOTE — H&P (Signed)
Problem: Mid sigmoid colon cancer  History: The patient is a 66 year old male born 07/13/1951. Yesterday, he underwent a screening colonoscopy with removal of a 10 mm sessile sigmoid colon polyp in piecemeal fashion using the hot snare.  The pathologist called me this morning to report the polyp removed was an invasive adenocarcinoma.  The patient is scheduled to undergo a flexible proctosigmoidoscopy to tattoo the site of the mid sigmoid colon cancer.  Past medical history: Appendectomy. Type 2 diabetes mellitus. Hypertension. Gastroesophageal reflux.  Exam: The patient is alert and lying comfortably on the endoscopy stretcher. Abdomen is soft and nontender to palpation. Lungs are clear to auscultation. Cardiac exam reveals a regular rhythm.  Plan: Proceed with flexible proctosigmoidoscopy to tattoo the sigmoid colon cancer site.

## 2016-12-20 NOTE — H&P (Signed)
Problem: Mid sigmoid colon cancer  History: The patient is a 66 year old male born 1951/02/24. Yesterday, the patient underwent a screening colonoscopy with removal of a 10 mm mid sigmoid colon sessile polyp.  The pathologists called me today to report that the polyp removed was an adenocarcinoma.  The patient is scheduled to undergo flexible proctosigmoidoscopy to had 2 the sigmoid colon cancer site.  Past medical history: Appendectomy. Type 2 diabetes mellitus. Hypertension. Gastroesophageal reflux.  Exam: The patient is alert and lying comfortably on the endoscopy stretcher. Abdomen is soft and nontender to palpation. Lungs are clear to auscultation. Cardiac exam reveals a regular rhythm.  Plan: Proceed with flexible proctosigmoidoscopy to tattoo the sigmoid colon cancer site.

## 2016-12-20 NOTE — Op Note (Signed)
[facility] Patient Name: Angel [name] Procedure Date: 12/20/2016 MRN: [id number] Attending MD: Garlan Fair , MD Date of Birth: 13-Feb-1951 CSN: [id number] Age: 66 Admit Type: Inpatient Procedure:                Flexible Sigmoidoscopy Indications:              Cancer in the sigmoid colon Providers:                Garlan Fair, MD, Laverta Baltimore RN, RN,                            [facility], Technician Referring MD:              Medicines:                Fentanyl 75 micrograms IV, Midazolam 5 mg IV Complications:            No immediate complications. Estimated Blood Loss:     Estimated blood loss was minimal. Procedure:                Pre-Anesthesia Assessment:                           - Prior to the procedure, a History and Physical                            was performed, and patient medications and                            allergies were reviewed. The patient's tolerance of                            previous anesthesia was also reviewed. The risks                            and benefits of the procedure and the sedation                            options and risks were discussed with the patient.                            All questions were answered, and informed consent                            was obtained. Prior Anticoagulants: The patient has                            taken no previous anticoagulant or antiplatelet                            agents. ASA Grade Assessment: III - A patient with                            severe systemic disease. After reviewing the risks  and benefits, the patient was deemed in                            satisfactory condition to undergo the procedure.                           After obtaining informed consent, the scope was                            passed under direct vision. The [id number]                            scope was introduced through the anus and  advanced                            to the the descending colon. The flexible                            sigmoidoscopy was accomplished without difficulty.                            The patient tolerated the procedure well. The                            quality of the bowel preparation was adequate. Scope In: Scope Out: Findings:      The perianal and digital rectal examinations were normal.      At 55 cm from the anal verge, the malignant colon polypectomy site was       successfully injected with [place] ink for tattooing and an Endo Clip was       applied to the polypectomy site. Impression:               - An area in the proximal sigmoid colon                            successfully injected.                           - No specimens collected. Moderate Sedation:      Versed 5 mg and fentanyl 75 mcg Recommendation:           - Check hemogram with white blood cell count and                            platelets today. Procedure Code(s):        --- Professional ---                           [phone], Sigmoidoscopy, flexible; with directed                            submucosal injection(s), any substance Diagnosis Code(s):        --- Professional ---                           C18.7,  Malignant neoplasm of sigmoid colon CPT copyright 2016 American Medical Association. All rights reserved. The codes documented in this report are preliminary and upon coder review may  be revised to meet current compliance requirements. Earle Gell, MD Garlan Fair, MD 12/20/2016 1:26:57 PM This report has been signed electronically. Number of Addenda: 0

## 2016-12-21 ENCOUNTER — Encounter (HOSPITAL_COMMUNITY): Payer: Self-pay | Admitting: Gastroenterology

## 2016-12-22 LAB — CEA: CEA: 3.6 ng/mL (ref 0.0–4.7)

## 2016-12-29 ENCOUNTER — Ambulatory Visit (HOSPITAL_COMMUNITY)
Admission: RE | Admit: 2016-12-29 | Discharge: 2016-12-29 | Disposition: A | Discharge status: Home / Self Care | Payer: 59 | Source: Ambulatory Visit | Attending: Gastroenterology | Admitting: Gastroenterology

## 2016-12-29 ENCOUNTER — Encounter (HOSPITAL_COMMUNITY): Payer: Self-pay

## 2016-12-29 DIAGNOSIS — K760 Fatty (change of) liver, not elsewhere classified: Secondary | ICD-10-CM | POA: Insufficient documentation

## 2016-12-29 DIAGNOSIS — C187 Malignant neoplasm of sigmoid colon: Secondary | ICD-10-CM | POA: Diagnosis present

## 2016-12-29 DIAGNOSIS — R918 Other nonspecific abnormal finding of lung field: Secondary | ICD-10-CM | POA: Diagnosis not present

## 2016-12-29 MED ORDER — IOPAMIDOL (ISOVUE-300) INJECTION 61%
INTRAVENOUS | Status: AC
  Administered 2016-12-29: 100 mL
  Filled 2016-12-29: qty 100

## 2017-01-03 ENCOUNTER — Other Ambulatory Visit: Payer: Self-pay | Admitting: General Surgery

## 2017-01-03 NOTE — H&P (Signed)
  History of Present Illness (Rosaland Shiffman MD; 01/03/2017 9:57 AM) The patient is a 65 year old male who presents with colorectal cancer. 65-year-old male who presents to the office for evaluation of a newly diagnosed colon cancer. He recently underwent a colonoscopy which showed cancer within a polyp that appears to be resected piecemeal. The area was then tattooed after the diagnosis of cancer was obtained. CT scans show no signs of metastatic disease in the liver. There are some small nodules in the lung that are felt to be benign but would require follow-up scan in approximately 6 months. He has no major symptoms and has regular bowel habits.   Past Surgical History (Kelly Dockery, [name]; 01/03/2017 9:41 AM) Appendectomy Colon Polyp Removal - Open  Diagnostic Studies History (Kelly Dockery, [name]; 01/03/2017 9:41 AM) Colonoscopy within last year  Allergies (Kelly Dockery, [name]; 01/03/2017 9:42 AM) Quinine Derivatives Itching.  Medication History (Kelly Dockery, [name]; 01/03/2017 9:43 AM) Atorvastatin Calcium (10MG Tablet, Oral) Active. GlyBURIDE (5MG Tablet, Oral) Active. Losartan Potassium-HCTZ (50-12.5MG Tablet, Oral) Active. Omeprazole (20MG Capsule DR, Oral) Active. OneTouch Ultra Blue (In Vitro) Active. Sildenafil Citrate (20MG Tablet, Oral) Active. Flonase Allergy Relief (50MCG/ACT Suspension, Nasal) Active. Aspirin (81MG Tablet DR, Oral) Active. Multivitamin Adult (Oral) Active. Medications Reconciled  Social History (Kelly Dockery, [name]; 01/03/2017 9:41 AM) Alcohol use Occasional alcohol use. Caffeine use Carbonated beverages, Coffee. No drug use Tobacco use Former smoker.  Family History (Kelly Dockery, [name]; 01/03/2017 9:41 AM) Heart Disease Mother. Hypertension Brother, Mother, Sister.  Other Problems (Kelly Dockery, [name]; 01/03/2017 9:41 AM) Diabetes Mellitus High blood pressure Hypercholesterolemia     Review of Systems (Kelly Dockery [name]; 01/03/2017  9:41 AM) General Not Present- Appetite Loss, Chills, Fatigue, Fever, Night Sweats, Weight Gain and Weight Loss. Skin Not Present- Change in Wart/Mole, Dryness, Hives, Jaundice, New Lesions, Non-Healing Wounds, Rash and Ulcer. HEENT Present- Wears glasses/contact lenses. Not Present- Earache, Hearing Loss, Hoarseness, Nose Bleed, Oral Ulcers, Ringing in the Ears, Seasonal Allergies, Sinus Pain, Sore Throat, Visual Disturbances and Yellow Eyes. Cardiovascular Not Present- Chest Pain, Difficulty Breathing Lying Down, Leg Cramps, Palpitations, Rapid Heart Rate, Shortness of Breath and Swelling of Extremities. Gastrointestinal Not Present- Abdominal Pain, Bloating, Bloody Stool, Change in Bowel Habits, Chronic diarrhea, Constipation, Difficulty Swallowing, Excessive gas, Gets full quickly at meals, Hemorrhoids, Indigestion, Nausea, Rectal Pain and Vomiting. Musculoskeletal Present- Back Pain and Joint Pain. Not Present- Joint Stiffness, Muscle Pain, Muscle Weakness and Swelling of Extremities.  Vitals (Kelly Dockery [name]; 01/03/2017 9:41 AM) 01/03/2017 9:41 AM Weight: 263.6 lb Height: 69.5in Body Surface Area: 2.34 m Body Mass Index: 38.37 kg/m  Temp.: 98.4F(Oral)  Pulse: 83 (Regular)  BP: 122/74 (Sitting, Left Arm, Standard)      Physical Exam (Dailen Mcclish MD; 01/03/2017 9:58 AM)  General Mental Status-Alert. General Appearance-Not in acute distress. Build & Nutrition-Well nourished. Posture-Normal posture. Gait-Normal.  Head and Neck Head-normocephalic, atraumatic with no lesions or palpable masses. Trachea-midline.  Chest and Lung Exam Chest and lung exam reveals -on auscultation, normal breath sounds, no adventitious sounds and normal vocal resonance.  Cardiovascular Cardiovascular examination reveals -normal heart sounds, regular rate and rhythm with no murmurs and no digital clubbing, cyanosis, edema, increased warmth or  tenderness.  Abdomen Inspection Hernias - Umbilical hernia - Reducible. Note: small. Palpation/Percussion Palpation and Percussion of the abdomen reveal - Soft, Non Tender, No Rigidity (guarding), No hepatosplenomegaly and No Palpable abdominal masses.  Neurologic Neurologic evaluation reveals -alert and oriented x 3 with no impairment of recent   or remote memory, normal attention span and ability to concentrate, normal sensation and normal coordination.  Musculoskeletal Normal Exam - Bilateral-Upper Extremity Strength Normal and Lower Extremity Strength Normal.    Assessment & Plan (Krissa Utke MD; 01/03/2017 9:54 AM)  CANCER OF SIGMOID COLON (C18.7) Impression: 65-year-old male who recently underwent a colonoscopy by Dr. Johnson. A polyp was removed from the sigmoid colon which showed signs of invasive adenocarcinoma. Margins were involved. The area has been tattooed. CT scan showed no evidence of metastatic disease but he will need a follow-up chest CT in about 6 months. We will plan on proceeding with a minimally invasive partial colectomy. The surgery and anatomy were described to the patient as well as the risks of surgery and the possible complications. These include: Bleeding, deep abdominal infections and possible wound complications such as hernia and infection, damage to adjacent structures, leak of surgical connections, which can lead to other surgeries and possibly an ostomy, possible need for other procedures, such as abscess drains in radiology, possible prolonged hospital stay, possible diarrhea from removal of part of the colon, possible constipation from narcotics, possible bowel, bladder or sexual dysfunction if having rectal surgery, prolonged fatigue/weakness or appetite loss, possible early recurrence of of disease, possible complications of their medical problems such as heart disease or arrhythmias or lung problems, death (less than 1%). I believe the patient  understands and wishes to proceed with the surgery. 

## 2017-01-19 NOTE — Patient Instructions (Signed)
Angel [name]  01/19/2017   Your procedure is scheduled on: 02-02-17  Report to [facility] Main  Entrance take [facility]  elevators to 3rd floor to  [place] at 730  AM.  Call this number if you have problems the morning of surgery [phone]   Remember: ONLY 1 PERSON MAY GO WITH YOU TO SHORT STAY TO GET  READY MORNING OF [place].  Do not eat food :After Midnight.TUESDAY NIGHT CLEAR LIQUIDS ALL DAY Wednesday 02-01-17, NO CLEAR LIQUIDS AFTER MIDNIGHT Wednesday NIGHT. DRINK PLENTY OF FLUIDS DAY OF BOWEL PREP TO PREVENT DEHYDRATION, FOLLOW ALL BOWEL PREP INSTRUCTIONS FROM DR Marcello Moores.     Take these medicines the morning of surgery with A SIP OF WATER: ATORVASTATIN (LIPITOR) DO NOT TAKE ANY DIABETIC MEDICATIONS DAY OF YOUR SURGERY                               You may not have any metal on your body including hair pins and              piercings  Do not wear jewelry, make-up, lotions, powders or perfumes, deodorant             Do not wear nail polish.  Do not shave  48 hours prior to surgery.              Men may shave face and neck.   Do not bring valuables to the hospital. [place].  Contacts, dentures or bridgework may not be worn into surgery.  Leave suitcase in the car. After surgery it may be brought to your room.                 Please read over the following fact sheets you were given: _____________________________________________________________________             How to Manage Your Diabetes Before and After Surgery  Why is it important to control my blood sugar before and after surgery? . Improving blood sugar levels before and after surgery helps healing and can limit problems. . A way of improving blood sugar control is eating a healthy diet by: o  Eating less sugar and carbohydrates o  Increasing activity/exercise o  Talking with your doctor about reaching your blood sugar  goals . High blood sugars (greater than 180 mg/dL) can raise your risk of infections and slow your recovery, so you will need to focus on controlling your diabetes during the weeks before surgery. . Make sure that the doctor who takes care of your diabetes knows about your planned surgery including the date and location.  How do I manage my blood sugar before surgery? . Check your blood sugar at least 4 times a day, starting 2 days before surgery, to make sure that the level is not too high or low. o Check your blood sugar the morning of your surgery when you wake up and every 2 hours until you get to the Short Stay unit. . If your blood sugar is less than 70 mg/dL, you will need to treat for low blood sugar: o Do not take insulin. o Treat a low blood sugar (less than 70 mg/dL) with  cup of clear juice (cranberry or apple),  4 glucose tablets, OR glucose gel. o Recheck blood sugar in 15 minutes after treatment (to make sure it is greater than 70 mg/dL). If your blood sugar is not greater than 70 mg/dL on recheck, call [phone] for further instructions. . Report your blood sugar to the short stay nurse when you get to Short Stay.  . If you are admitted to the hospital after surgery: o Your blood sugar will be checked by the staff and you will probably be given insulin after surgery (instead of oral diabetes medicines) to make sure you have good blood sugar levels. o The goal for blood sugar control after surgery is 80-180 mg/dL.   WHAT DO I DO ABOUT MY DIABETES MEDICATION?  TAKE ONLY MORNING OR LUNCH TIME DOSE OF GLYBURIDE DAY BEFORE SURGERY 02-01-17 DO NOT TAKE GLYBURIDE DAY OF SURGERY 02-02-17  TAKE METFORMIN ASUSUAL DAY BEFORE SURGERY 02-01-17 DO NOT TAKE METFORMIN DAY OF SURGERY 02-02-17  Patient Signature:  Date:   Nurse Signature:  Date:   Reviewed and Endorsed by  [facility] Health Patient Education Committee, August 2015   CLEAR LIQUID DIET   Foods Allowed                                                                      Foods Excluded  Coffee and tea, regular and decaf                             liquids that you cannot  Plain Jell-O in any flavor                                             see through such as: Fruit ices (not with fruit pulp)                                     milk, soups, orange juice  Iced Popsicles                                    All solid food Carbonated beverages, regular and diet                                    Cranberry, grape and apple juices Sports drinks like Gatorade Lightly seasoned clear broth or consume(fat free) Sugar, honey syrup  Sample Menu Breakfast                                Lunch                                     Supper Cranberry juice                    Beef broth  Chicken broth Jell-O                                     Grape juice                           Apple juice Coffee or tea                        Jell-O                                      Popsicle                                                Coffee or tea                        Coffee or tea  _____________________________________________________________________ [facility] - Preparing for Surgery Before surgery, you can play an important role.  Because skin is not sterile, your skin needs to be as free of germs as possible.  You can reduce the number of germs on your skin by washing with CHG (chlorahexidine gluconate) soap before surgery.  CHG is an antiseptic cleaner which kills germs and bonds with the skin to continue killing germs even after washing. Please DO NOT use if you have an allergy to CHG or antibacterial soaps.  If your skin becomes reddened/irritated stop using the CHG and inform your nurse when you arrive at Short Stay. Do not shave (including legs and underarms) for at least 48 hours prior to the first CHG shower.  You may shave your face/neck. Please follow these instructions carefully:  1.  Shower with CHG  Soap the night before surgery and the  morning of Surgery.  2.  If you choose to wash your hair, wash your hair first as usual with your  normal  shampoo.  3.  After you shampoo, rinse your hair and body thoroughly to remove the  shampoo.                           4.  Use CHG as you would any other liquid soap.  You can apply chg directly  to the skin and wash                       Gently with a scrungie or clean washcloth.  5.  Apply the CHG Soap to your body ONLY FROM THE NECK DOWN.   Do not use on face/ open                           Wound or open sores. Avoid contact with eyes, ears mouth and genitals (private parts).                       Wash face,  Genitals (private parts) with your normal soap.             6.  Wash thoroughly, paying special attention to the area where  your surgery  will be performed.  7.  Thoroughly rinse your body with warm water from the neck down.  8.  DO NOT shower/wash with your normal soap after using and rinsing off  the CHG Soap.                9.  Pat yourself dry with a clean towel.            10.  Wear clean pajamas.            11.  Place clean sheets on your bed the night of your first shower and do not  sleep with pets. Day of Surgery : Do not apply any lotions/deodorants the morning of surgery.  Please wear clean clothes to the hospital/surgery center.   WHAT IS A BLOOD TRANSFUSION? Blood Transfusion Information  A transfusion is the replacement of blood or some of its parts. Blood is made up of multiple cells which provide different functions.  Red blood cells carry oxygen and are used for blood loss replacement.  White blood cells fight against infection.  Platelets control bleeding.  Plasma helps clot blood.  Other blood products are available for specialized needs, such as hemophilia or other clotting disorders. BEFORE THE TRANSFUSION  Who gives blood for transfusions?   Healthy volunteers who are fully evaluated to make sure their blood is  safe. This is blood bank blood. Transfusion therapy is the safest it has ever been in the practice of medicine. Before blood is taken from a donor, a complete history is taken to make sure that person has no history of diseases nor engages in risky social behavior (examples are intravenous drug use or sexual activity with multiple partners). The donor's travel history is screened to minimize risk of transmitting infections, such as malaria. The donated blood is tested for signs of infectious diseases, such as HIV and hepatitis. The blood is then tested to be sure it is compatible with you in order to minimize the chance of a transfusion reaction. If you or a relative donates blood, this is often done in anticipation of surgery and is not appropriate for emergency situations. It takes many days to process the donated blood. RISKS AND COMPLICATIONS Although transfusion therapy is very safe and saves many lives, the main dangers of transfusion include:   Getting an infectious disease.  Developing a transfusion reaction. This is an allergic reaction to something in the blood you were given. Every precaution is taken to prevent this. The decision to have a blood transfusion has been considered carefully by your caregiver before blood is given. Blood is not given unless the benefits outweigh the risks. AFTER THE TRANSFUSION  Right after receiving a blood transfusion, you will usually feel much better and more energetic. This is especially true if your red blood cells have gotten low (anemic). The transfusion raises the level of the red blood cells which carry oxygen, and this usually causes an energy increase.  The nurse administering the transfusion will monitor you carefully for complications. HOME CARE INSTRUCTIONS  No special instructions are needed after a transfusion. You may find your energy is better. Speak with your caregiver about any limitations on activity for underlying diseases you may  have. SEEK MEDICAL CARE IF:   Your condition is not improving after your transfusion.  You develop redness or irritation at the intravenous (IV) site. SEEK IMMEDIATE MEDICAL CARE IF:  Any of the following symptoms occur over the next 12 hours:  Shaking  chills.  You have a temperature by mouth above 102 F (38.9 C), not controlled by medicine.  Chest, back, or muscle pain.  People around you feel you are not acting correctly or are confused.  Shortness of breath or difficulty breathing.  Dizziness and fainting.  You get a rash or develop hives.  You have a decrease in urine output.  Your urine turns a dark color or changes to pink, red, or brown. Any of the following symptoms occur over the next 10 days:  You have a temperature by mouth above 102 F (38.9 C), not controlled by medicine.  Shortness of breath.  Weakness after normal activity.  The white part of the eye turns yellow (jaundice).  You have a decrease in the amount of urine or are urinating less often.  Your urine turns a dark color or changes to pink, red, or brown. Document Released: 11/11/2000 Document Revised: 02/06/2012 Document Reviewed: 06/30/2008 [facility] Patient Information 2014 [place], [place].  _______________________________________________________________________

## 2017-01-19 NOTE — Progress Notes (Signed)
CHEST CT WITH CONTRAST 12-29-16 EPIC

## 2017-01-23 ENCOUNTER — Inpatient Hospital Stay (HOSPITAL_COMMUNITY)
Admission: RE | Admit: 2017-01-23 | Discharge: 2017-01-23 | Disposition: A | Discharge status: Home / Self Care | Payer: 59 | Source: Ambulatory Visit

## 2017-01-30 NOTE — Patient Instructions (Addendum)
Angel [name]  01/30/2017   Your procedure is scheduled on: 02/02/17     Report to [facility] Main  Entrance take [facility]  elevators to 3rd floor to  [place] at           0730 AM.  Call this number if you have problems the morning of surgery [phone]   Remember: ONLY 1 PERSON MAY GO WITH YOU TO SHORT STAY TO GET  READY MORNING OF [place].  Do not eat food or drink liquids :After Midnight.     Take these medicines the morning of surgery with A SIP OF WATER:  ATORVASTATIN (LIPITOR)             DO NOT TAKE ANY DIABETIC MEDICATIONS DAY OF YOUR SURGERY                               You may not have any metal on your body including hair pins and              piercings  Do not wear jewelry, lotions, powders or perfumes, deodorant                       Men may shave face and neck.   Do not bring valuables to the hospital. [place].  Contacts, dentures or bridgework may not be worn into surgery.  Leave suitcase in the car. After surgery it may be brought to your room.                  Please read over the following fact sheets you were given: _____________________________________________________________________             [facility] - Preparing for Surgery Before surgery, you can play an important role.  Because skin is not sterile, your skin needs to be as free of germs as possible.  You can reduce the number of germs on your skin by washing with CHG (chlorahexidine gluconate) soap before surgery.  CHG is an antiseptic cleaner which kills germs and bonds with the skin to continue killing germs even after washing. Please DO NOT use if you have an allergy to CHG or antibacterial soaps.  If your skin becomes reddened/irritated stop using the CHG and inform your nurse when you arrive at Short Stay. Do not shave (including legs and underarms) for at least 48 hours prior to the first CHG shower.  You may  shave your face/neck. Please follow these instructions carefully:  1.  Shower with CHG Soap the night before surgery and the  morning of Surgery.  2.  If you choose to wash your hair, wash your hair first as usual with your  normal  shampoo.  3.  After you shampoo, rinse your hair and body thoroughly to remove the  shampoo.                           4.  Use CHG as you would any other liquid soap.  You can apply chg directly  to the skin and wash  Gently with a scrungie or clean washcloth.  5.  Apply the CHG Soap to your body ONLY FROM THE NECK DOWN.   Do not use on face/ open                           Wound or open sores. Avoid contact with eyes, ears mouth and genitals (private parts).                       Wash face,  Genitals (private parts) with your normal soap.             6.  Wash thoroughly, paying special attention to the area where your surgery  will be performed.  7.  Thoroughly rinse your body with warm water from the neck down.  8.  DO NOT shower/wash with your normal soap after using and rinsing off  the CHG Soap.                9.  Pat yourself dry with a clean towel.            10.  Wear clean pajamas.            11.  Place clean sheets on your bed the night of your first shower and do not  sleep with pets. Day of Surgery : Do not apply any lotions/deodorants the morning of surgery.  Please wear clean clothes to the hospital/surgery center.  FAILURE TO FOLLOW THESE INSTRUCTIONS MAY RESULT IN THE CANCELLATION OF YOUR SURGERY PATIENT SIGNATURE_________________________________  NURSE SIGNATURE__________________________________  ________________________________________________________________________  WHAT IS A BLOOD TRANSFUSION? Blood Transfusion Information  A transfusion is the replacement of blood or some of its parts. Blood is made up of multiple cells which provide different functions.  Red blood cells carry oxygen and are used for blood loss  replacement.  White blood cells fight against infection.  Platelets control bleeding.  Plasma helps clot blood.  Other blood products are available for specialized needs, such as hemophilia or other clotting disorders. BEFORE THE TRANSFUSION  Who gives blood for transfusions?   Healthy volunteers who are fully evaluated to make sure their blood is safe. This is blood bank blood. Transfusion therapy is the safest it has ever been in the practice of medicine. Before blood is taken from a donor, a complete history is taken to make sure that person has no history of diseases nor engages in risky social behavior (examples are intravenous drug use or sexual activity with multiple partners). The donor's travel history is screened to minimize risk of transmitting infections, such as malaria. The donated blood is tested for signs of infectious diseases, such as HIV and hepatitis. The blood is then tested to be sure it is compatible with you in order to minimize the chance of a transfusion reaction. If you or a relative donates blood, this is often done in anticipation of surgery and is not appropriate for emergency situations. It takes many days to process the donated blood. RISKS AND COMPLICATIONS Although transfusion therapy is very safe and saves many lives, the main dangers of transfusion include:   Getting an infectious disease.  Developing a transfusion reaction. This is an allergic reaction to something in the blood you were given. Every precaution is taken to prevent this. The decision to have a blood transfusion has been considered carefully by your caregiver before blood is given. Blood is not given unless the benefits outweigh  the risks. AFTER THE TRANSFUSION  Right after receiving a blood transfusion, you will usually feel much better and more energetic. This is especially true if your red blood cells have gotten low (anemic). The transfusion raises the level of the red blood cells which  carry oxygen, and this usually causes an energy increase.  The nurse administering the transfusion will monitor you carefully for complications. HOME CARE INSTRUCTIONS  No special instructions are needed after a transfusion. You may find your energy is better. Speak with your caregiver about any limitations on activity for underlying diseases you may have. SEEK MEDICAL CARE IF:   Your condition is not improving after your transfusion.  You develop redness or irritation at the intravenous (IV) site. SEEK IMMEDIATE MEDICAL CARE IF:  Any of the following symptoms occur over the next 12 hours:  Shaking chills.  You have a temperature by mouth above 102 F (38.9 C), not controlled by medicine.  Chest, back, or muscle pain.  People around you feel you are not acting correctly or are confused.  Shortness of breath or difficulty breathing.  Dizziness and fainting.  You get a rash or develop hives.  You have a decrease in urine output.  Your urine turns a dark color or changes to pink, red, or brown. Any of the following symptoms occur over the next 10 days:  You have a temperature by mouth above 102 F (38.9 C), not controlled by medicine.  Shortness of breath.  Weakness after normal activity.  The white part of the eye turns yellow (jaundice).  You have a decrease in the amount of urine or are urinating less often.  Your urine turns a dark color or changes to pink, red, or brown. Document Released: 11/11/2000 Document Revised: 02/06/2012 Document Reviewed: 06/30/2008 [employer] Patient Information 2014 [employer], [place].  _______________________________________________________________________   CLEAR LIQUID DIET   Foods Allowed                                                                     Foods Excluded  Coffee and tea, regular and decaf                             liquids that you cannot  Plain Jell-O in any flavor                                             see  through such as: Fruit ices (not with fruit pulp)                                     milk, soups, orange juice  Iced Popsicles                                    All solid food Carbonated beverages, regular and diet  Cranberry, grape and apple juices Sports drinks like Gatorade Lightly seasoned clear broth or consume(fat free) Sugar, honey syrup  Sample Menu Breakfast                                Lunch                                     Supper Cranberry juice                    Beef broth                            Chicken broth Jell-O                                     Grape juice                           Apple juice Coffee or tea                        Jell-O                                      Popsicle                                                Coffee or tea                        Coffee or tea  _____________________________________________________________________

## 2017-01-31 ENCOUNTER — Encounter (HOSPITAL_COMMUNITY)
Admission: RE | Admit: 2017-01-31 | Discharge: 2017-01-31 | Disposition: A | Discharge status: Home / Self Care | Payer: 59 | Source: Ambulatory Visit | Attending: General Surgery | Admitting: General Surgery

## 2017-01-31 ENCOUNTER — Encounter (HOSPITAL_COMMUNITY): Payer: Self-pay

## 2017-01-31 HISTORY — DX: Malignant (primary) neoplasm, unspecified: C80.1

## 2017-01-31 LAB — CBC
HCT: 44.9 % (ref 39.0–52.0)
Hemoglobin: 14.7 g/dL (ref 13.0–17.0)
MCH: 24.2 pg — ABNORMAL LOW (ref 26.0–34.0)
MCHC: 32.7 g/dL (ref 30.0–36.0)
MCV: 74 fL — ABNORMAL LOW (ref 78.0–100.0)
PLATELETS: 136 10*3/uL — AB (ref 150–400)
RBC: 6.07 MIL/uL — ABNORMAL HIGH (ref 4.22–5.81)
RDW: 15.1 % (ref 11.5–15.5)
WBC: 5.1 10*3/uL (ref 4.0–10.5)

## 2017-01-31 LAB — BASIC METABOLIC PANEL
Anion gap: 6 (ref 5–15)
BUN: 12 mg/dL (ref 6–20)
CALCIUM: 9.1 mg/dL (ref 8.9–10.3)
CHLORIDE: 103 mmol/L (ref 101–111)
CO2: 29 mmol/L (ref 22–32)
CREATININE: 0.95 mg/dL (ref 0.61–1.24)
GFR calc non Af Amer: >60 mL/min (ref >60)
Glucose, Bld: 171 mg/dL — ABNORMAL HIGH (ref 65–99)
Potassium: 4.1 mmol/L (ref 3.5–5.1)
SODIUM: 138 mmol/L (ref 135–145)

## 2017-01-31 LAB — GLUCOSE, CAPILLARY: Glucose-Capillary: 178 mg/dL — ABNORMAL HIGH (ref 65–99)

## 2017-01-31 LAB — ABO/RH: ABO/RH(D): O POS

## 2017-01-31 NOTE — Progress Notes (Signed)
CBC routed to Dr. Leighton Ruff via epic done 01/31/17

## 2017-02-01 LAB — HEMOGLOBIN A1C
Hgb A1c MFr Bld: 8.8 % — ABNORMAL HIGH (ref 4.8–5.6)
MEAN PLASMA GLUCOSE: 206 mg/dL

## 2017-02-01 NOTE — Progress Notes (Signed)
Final EKG 02/01/17 in epic

## 2017-02-01 NOTE — Progress Notes (Signed)
Hgba1c routed to Dr. Marcello Moores via epic

## 2017-02-02 ENCOUNTER — Encounter (HOSPITAL_COMMUNITY)
Admission: RE | Disposition: A | Discharge status: Home / Self Care | Payer: Self-pay | Source: Ambulatory Visit | Attending: General Surgery

## 2017-02-02 ENCOUNTER — Encounter (HOSPITAL_COMMUNITY): Payer: Self-pay | Admitting: *Deleted

## 2017-02-02 ENCOUNTER — Inpatient Hospital Stay (HOSPITAL_COMMUNITY): Payer: 59 | Admitting: Registered Nurse

## 2017-02-02 ENCOUNTER — Inpatient Hospital Stay (HOSPITAL_COMMUNITY)
Admission: RE | Admit: 2017-02-02 | Discharge: 2017-02-05 | DRG: 331 | Disposition: A | Discharge status: Home / Self Care | Payer: 59 | Source: Ambulatory Visit | Attending: General Surgery | Admitting: General Surgery

## 2017-02-02 DIAGNOSIS — E119 Type 2 diabetes mellitus without complications: Secondary | ICD-10-CM | POA: Diagnosis present

## 2017-02-02 DIAGNOSIS — I10 Essential (primary) hypertension: Secondary | ICD-10-CM | POA: Diagnosis present

## 2017-02-02 DIAGNOSIS — Z79899 Other long term (current) drug therapy: Secondary | ICD-10-CM

## 2017-02-02 DIAGNOSIS — Z888 Allergy status to other drugs, medicaments and biological substances status: Secondary | ICD-10-CM

## 2017-02-02 DIAGNOSIS — K429 Umbilical hernia without obstruction or gangrene: Secondary | ICD-10-CM | POA: Diagnosis present

## 2017-02-02 DIAGNOSIS — Z7951 Long term (current) use of inhaled steroids: Secondary | ICD-10-CM

## 2017-02-02 DIAGNOSIS — C189 Malignant neoplasm of colon, unspecified: Secondary | ICD-10-CM | POA: Diagnosis present

## 2017-02-02 DIAGNOSIS — Z87891 Personal history of nicotine dependence: Secondary | ICD-10-CM

## 2017-02-02 HISTORY — PX: LAPAROSCOPIC PARTIAL COLECTOMY: SHX5907

## 2017-02-02 HISTORY — PX: UMBILICAL HERNIA REPAIR: SHX196

## 2017-02-02 LAB — GLUCOSE, CAPILLARY
GLUCOSE-CAPILLARY: 139 mg/dL — AB (ref 65–99)
Glucose-Capillary: 166 mg/dL — ABNORMAL HIGH (ref 65–99)
Glucose-Capillary: 135 mg/dL — ABNORMAL HIGH (ref 65–99)
Glucose-Capillary: 146 mg/dL — ABNORMAL HIGH (ref 65–99)

## 2017-02-02 SURGERY — LAPAROSCOPIC PARTIAL COLECTOMY
Anesthesia: General

## 2017-02-02 MED ORDER — PROPOFOL 10 MG/ML IV BOLUS
INTRAVENOUS | Status: AC
  Filled 2017-02-02: qty 20

## 2017-02-02 MED ORDER — MEPERIDINE HCL 50 MG/ML IJ SOLN: 6.2500 mg | INTRAMUSCULAR | Status: DC | PRN

## 2017-02-02 MED ORDER — FENTANYL CITRATE (PF) 100 MCG/2ML IJ SOLN
INTRAMUSCULAR | Status: DC | PRN
  Administered 2017-02-02: 100 ug via INTRAVENOUS
  Administered 2017-02-02 (×3): 50 ug via INTRAVENOUS

## 2017-02-02 MED ORDER — ROCURONIUM BROMIDE 10 MG/ML (PF) SYRINGE
PREFILLED_SYRINGE | INTRAVENOUS | Status: DC | PRN
  Administered 2017-02-02: 20 mg via INTRAVENOUS
  Administered 2017-02-02: 50 mg via INTRAVENOUS
  Administered 2017-02-02: 20 mg via INTRAVENOUS

## 2017-02-02 MED ORDER — SUCCINYLCHOLINE CHLORIDE 200 MG/10ML IV SOSY
PREFILLED_SYRINGE | INTRAVENOUS | Status: AC
  Filled 2017-02-02: qty 10

## 2017-02-02 MED ORDER — LOSARTAN POTASSIUM 50 MG PO TABS
50.0000 mg | ORAL_TABLET | Freq: Every day | ORAL | Status: DC
  Administered 2017-02-02 – 2017-02-05 (×4): 50 mg via ORAL
  Filled 2017-02-02 (×4): qty 1

## 2017-02-02 MED ORDER — ALVIMOPAN 12 MG PO CAPS
12.0000 mg | ORAL_CAPSULE | Freq: Once | ORAL | Status: AC
  Administered 2017-02-02: 12 mg via ORAL
  Filled 2017-02-02: qty 1

## 2017-02-02 MED ORDER — LACTATED RINGERS IR SOLN
Status: DC | PRN
  Administered 2017-02-02: 1000 mL

## 2017-02-02 MED ORDER — PANTOPRAZOLE SODIUM 40 MG PO TBEC
40.0000 mg | DELAYED_RELEASE_TABLET | Freq: Every day | ORAL | Status: DC
  Administered 2017-02-02 – 2017-02-05 (×4): 40 mg via ORAL
  Filled 2017-02-02 (×4): qty 1

## 2017-02-02 MED ORDER — ATORVASTATIN CALCIUM 10 MG PO TABS
10.0000 mg | ORAL_TABLET | Freq: Every day | ORAL | Status: DC
  Administered 2017-02-02 – 2017-02-05 (×4): 10 mg via ORAL
  Filled 2017-02-02 (×4): qty 1

## 2017-02-02 MED ORDER — ACETAMINOPHEN 500 MG PO TABS
1000.0000 mg | ORAL_TABLET | Freq: Four times a day (QID) | ORAL | Status: DC
  Administered 2017-02-02 – 2017-02-05 (×10): 1000 mg via ORAL
  Filled 2017-02-02 (×12): qty 2

## 2017-02-02 MED ORDER — MIDAZOLAM HCL 5 MG/5ML IJ SOLN
INTRAMUSCULAR | Status: DC | PRN
  Administered 2017-02-02: 2 mg via INTRAVENOUS

## 2017-02-02 MED ORDER — MIDAZOLAM HCL 2 MG/2ML IJ SOLN
INTRAMUSCULAR | Status: AC
  Filled 2017-02-02: qty 2

## 2017-02-02 MED ORDER — INSULIN ASPART 100 UNIT/ML ~~LOC~~ SOLN: 0.0000 [IU] | Freq: Every day | SUBCUTANEOUS | Status: DC

## 2017-02-02 MED ORDER — BUPIVACAINE LIPOSOME 1.3 % IJ SUSP
20.0000 mL | Freq: Once | INTRAMUSCULAR | Status: AC
Start: 2017-02-02 — End: 2017-02-02
  Administered 2017-02-02: 20 mL
  Filled 2017-02-02: qty 20

## 2017-02-02 MED ORDER — FENTANYL CITRATE (PF) 250 MCG/5ML IJ SOLN
INTRAMUSCULAR | Status: AC
  Filled 2017-02-02: qty 5

## 2017-02-02 MED ORDER — CELECOXIB 200 MG PO CAPS
400.0000 mg | ORAL_CAPSULE | ORAL | Status: AC
  Administered 2017-02-02: 400 mg via ORAL
  Filled 2017-02-02: qty 2

## 2017-02-02 MED ORDER — GABAPENTIN 300 MG PO CAPS
300.0000 mg | ORAL_CAPSULE | ORAL | Status: AC
  Administered 2017-02-02: 300 mg via ORAL
  Filled 2017-02-02: qty 1

## 2017-02-02 MED ORDER — MORPHINE SULFATE (PF) 4 MG/ML IV SOLN
2.0000 mg | INTRAVENOUS | Status: DC | PRN
  Administered 2017-02-04 – 2017-02-05 (×2): 2 mg via INTRAVENOUS
  Filled 2017-02-02 (×2): qty 1

## 2017-02-02 MED ORDER — HEPARIN SODIUM (PORCINE) 5000 UNIT/ML IJ SOLN
5000.0000 [IU] | Freq: Once | INTRAMUSCULAR | Status: AC
  Administered 2017-02-02: 5000 [IU] via SUBCUTANEOUS
  Filled 2017-02-02: qty 1

## 2017-02-02 MED ORDER — ALVIMOPAN 12 MG PO CAPS
12.0000 mg | ORAL_CAPSULE | Freq: Two times a day (BID) | ORAL | Status: DC
  Administered 2017-02-03 – 2017-02-04 (×4): 12 mg via ORAL
  Filled 2017-02-02 (×5): qty 1

## 2017-02-02 MED ORDER — LIP MEDEX EX OINT
TOPICAL_OINTMENT | CUTANEOUS | Status: AC
  Administered 2017-02-02: 17:00:00
  Filled 2017-02-02: qty 7

## 2017-02-02 MED ORDER — ONDANSETRON HCL 4 MG PO TABS
4.0000 mg | ORAL_TABLET | Freq: Four times a day (QID) | ORAL | Status: DC | PRN
  Administered 2017-02-03: 4 mg via ORAL
  Filled 2017-02-02: qty 1

## 2017-02-02 MED ORDER — 0.9 % SODIUM CHLORIDE (POUR BTL) OPTIME
TOPICAL | Status: DC | PRN
  Administered 2017-02-02: 2000 mL

## 2017-02-02 MED ORDER — SACCHAROMYCES BOULARDII 250 MG PO CAPS
250.0000 mg | ORAL_CAPSULE | Freq: Two times a day (BID) | ORAL | Status: DC
  Administered 2017-02-02 – 2017-02-05 (×6): 250 mg via ORAL
  Filled 2017-02-02 (×6): qty 1

## 2017-02-02 MED ORDER — CEFOTETAN DISODIUM-DEXTROSE 2-2.08 GM-% IV SOLR
2.0000 g | INTRAVENOUS | Status: AC
  Administered 2017-02-02: 2 g via INTRAVENOUS
  Filled 2017-02-02: qty 50

## 2017-02-02 MED ORDER — GLYBURIDE 5 MG PO TABS
10.0000 mg | ORAL_TABLET | Freq: Two times a day (BID) | ORAL | Status: DC
  Administered 2017-02-03 – 2017-02-05 (×5): 10 mg via ORAL
  Filled 2017-02-02 (×7): qty 2

## 2017-02-02 MED ORDER — HYDROMORPHONE HCL 1 MG/ML IJ SOLN
0.2500 mg | INTRAMUSCULAR | Status: DC | PRN
  Administered 2017-02-02: 0.5 mg via INTRAVENOUS
  Administered 2017-02-02: 0.25 mg via INTRAVENOUS
  Administered 2017-02-02: 0.5 mg via INTRAVENOUS
  Administered 2017-02-02: 0.25 mg via INTRAVENOUS
  Administered 2017-02-02: 0.5 mg via INTRAVENOUS

## 2017-02-02 MED ORDER — ONDANSETRON HCL 4 MG/2ML IJ SOLN
INTRAMUSCULAR | Status: AC
  Filled 2017-02-02: qty 2

## 2017-02-02 MED ORDER — ROCURONIUM BROMIDE 50 MG/5ML IV SOSY
PREFILLED_SYRINGE | INTRAVENOUS | Status: AC
  Filled 2017-02-02: qty 5

## 2017-02-02 MED ORDER — PROMETHAZINE HCL 25 MG/ML IJ SOLN: 6.2500 mg | INTRAMUSCULAR | Status: DC | PRN

## 2017-02-02 MED ORDER — KCL IN DEXTROSE-NACL 20-5-0.45 MEQ/L-%-% IV SOLN
INTRAVENOUS | Status: DC
  Administered 2017-02-02 – 2017-02-04 (×4): via INTRAVENOUS
  Filled 2017-02-02 (×7): qty 1000

## 2017-02-02 MED ORDER — SUGAMMADEX SODIUM 200 MG/2ML IV SOLN
INTRAVENOUS | Status: DC | PRN
  Administered 2017-02-02: 200 mg via INTRAVENOUS

## 2017-02-02 MED ORDER — SUCCINYLCHOLINE CHLORIDE 200 MG/10ML IV SOSY
PREFILLED_SYRINGE | INTRAVENOUS | Status: DC | PRN
  Administered 2017-02-02: 100 mg via INTRAVENOUS

## 2017-02-02 MED ORDER — LABETALOL HCL 5 MG/ML IV SOLN
INTRAVENOUS | Status: DC | PRN
  Administered 2017-02-02: 2.5 mg via INTRAVENOUS

## 2017-02-02 MED ORDER — HYDROCHLOROTHIAZIDE 12.5 MG PO CAPS
12.5000 mg | ORAL_CAPSULE | Freq: Every day | ORAL | Status: DC
  Administered 2017-02-02 – 2017-02-05 (×4): 12.5 mg via ORAL
  Filled 2017-02-02 (×4): qty 1

## 2017-02-02 MED ORDER — LOSARTAN POTASSIUM-HCTZ 50-12.5 MG PO TABS: 1.0000 | ORAL_TABLET | Freq: Every day | ORAL | Status: DC

## 2017-02-02 MED ORDER — ACETAMINOPHEN 500 MG PO TABS
1000.0000 mg | ORAL_TABLET | ORAL | Status: AC
  Administered 2017-02-02: 1000 mg via ORAL
  Filled 2017-02-02: qty 2

## 2017-02-02 MED ORDER — BUPIVACAINE HCL (PF) 0.25 % IJ SOLN
INTRAMUSCULAR | Status: AC
  Filled 2017-02-02: qty 30

## 2017-02-02 MED ORDER — INSULIN ASPART 100 UNIT/ML ~~LOC~~ SOLN
0.0000 [IU] | Freq: Three times a day (TID) | SUBCUTANEOUS | Status: DC
Start: 2017-02-02 — End: 2017-02-05
  Administered 2017-02-02: 3 [IU] via SUBCUTANEOUS
  Administered 2017-02-03 (×2): 4 [IU] via SUBCUTANEOUS
  Administered 2017-02-04: 3 [IU] via SUBCUTANEOUS
  Administered 2017-02-04: 4 [IU] via SUBCUTANEOUS
  Administered 2017-02-05: 3 [IU] via SUBCUTANEOUS

## 2017-02-02 MED ORDER — PROPOFOL 10 MG/ML IV BOLUS
INTRAVENOUS | Status: DC | PRN
Start: 2017-02-02 — End: 2017-02-02
  Administered 2017-02-02: 30 mg via INTRAVENOUS
  Administered 2017-02-02: 220 mg via INTRAVENOUS

## 2017-02-02 MED ORDER — LIDOCAINE 2% (20 MG/ML) 5 ML SYRINGE
INTRAMUSCULAR | Status: DC | PRN
  Administered 2017-02-02: 100 mg via INTRAVENOUS

## 2017-02-02 MED ORDER — ONDANSETRON HCL 4 MG/2ML IJ SOLN
INTRAMUSCULAR | Status: DC | PRN
  Administered 2017-02-02: 4 mg via INTRAVENOUS

## 2017-02-02 MED ORDER — ENOXAPARIN SODIUM 40 MG/0.4ML ~~LOC~~ SOLN
40.0000 mg | SUBCUTANEOUS | Status: DC
  Administered 2017-02-03 – 2017-02-05 (×3): 40 mg via SUBCUTANEOUS
  Filled 2017-02-02 (×3): qty 0.4

## 2017-02-02 MED ORDER — LACTATED RINGERS IV SOLN
INTRAVENOUS | Status: DC
  Administered 2017-02-02: 08:00:00 via INTRAVENOUS

## 2017-02-02 MED ORDER — CEFOTETAN DISODIUM 2 G IJ SOLR: 2.0000 g | INTRAMUSCULAR | Status: DC

## 2017-02-02 MED ORDER — METFORMIN HCL 500 MG PO TABS
1000.0000 mg | ORAL_TABLET | Freq: Two times a day (BID) | ORAL | Status: DC
  Administered 2017-02-05: 1000 mg via ORAL
  Filled 2017-02-02: qty 2

## 2017-02-02 MED ORDER — HYDROMORPHONE HCL 2 MG/ML IJ SOLN
INTRAMUSCULAR | Status: AC
  Filled 2017-02-02: qty 1

## 2017-02-02 MED ORDER — ONDANSETRON HCL 4 MG/2ML IJ SOLN: 4.0000 mg | Freq: Four times a day (QID) | INTRAMUSCULAR | Status: DC | PRN

## 2017-02-02 MED ORDER — DEXTROSE 5 % IV SOLN
2.0000 g | Freq: Two times a day (BID) | INTRAVENOUS | Status: AC
  Administered 2017-02-02: 2 g via INTRAVENOUS
  Filled 2017-02-02 (×2): qty 2

## 2017-02-02 MED ORDER — LABETALOL HCL 5 MG/ML IV SOLN
INTRAVENOUS | Status: AC
Start: 2017-02-02 — End: 2017-02-02
  Filled 2017-02-02: qty 4

## 2017-02-02 MED ORDER — LIDOCAINE 2% (20 MG/ML) 5 ML SYRINGE
INTRAMUSCULAR | Status: AC
  Filled 2017-02-02: qty 5

## 2017-02-02 MED ORDER — BUPIVACAINE HCL (PF) 0.25 % IJ SOLN
INTRAMUSCULAR | Status: DC | PRN
  Administered 2017-02-02: 30 mL

## 2017-02-02 SURGICAL SUPPLY — 72 items
ADH SKN CLS APL DERMABOND .7 (GAUZE/BANDAGES/DRESSINGS) ×1
APPLIER CLIP 5 13 M/L LIGAMAX5 (MISCELLANEOUS)
APR CLP MED LRG 5 ANG JAW (MISCELLANEOUS)
BLADE EXTENDED COATED 6.5IN (ELECTRODE) IMPLANT
CABLE HIGH FREQUENCY MONO STRZ (ELECTRODE) ×2 IMPLANT
CELLS DAT CNTRL 66122 CELL SVR (MISCELLANEOUS) IMPLANT
CHLORAPREP W/TINT 26ML (MISCELLANEOUS) ×2 IMPLANT
CLIP APPLIE 5 13 M/L LIGAMAX5 (MISCELLANEOUS) IMPLANT
COUNTER NEEDLE 20 DBL MAG RED (NEEDLE) ×2 IMPLANT
COVER MAYO STAND STRL (DRAPES) ×6 IMPLANT
COVER SURGICAL LIGHT HANDLE (MISCELLANEOUS) IMPLANT
DECANTER SPIKE VIAL GLASS SM (MISCELLANEOUS) ×2 IMPLANT
DERMABOND ADVANCED (GAUZE/BANDAGES/DRESSINGS) ×1
DERMABOND ADVANCED .7 DNX12 (GAUZE/BANDAGES/DRESSINGS) ×1 IMPLANT
DRAIN CHANNEL 19F RND (DRAIN) IMPLANT
DRAPE LAPAROSCOPIC ABDOMINAL (DRAPES) ×2 IMPLANT
DRAPE SURG IRRIG POUCH 19X23 (DRAPES) ×2 IMPLANT
DRSG OPSITE POSTOP 4X10 (GAUZE/BANDAGES/DRESSINGS) IMPLANT
DRSG OPSITE POSTOP 4X6 (GAUZE/BANDAGES/DRESSINGS) IMPLANT
DRSG OPSITE POSTOP 4X8 (GAUZE/BANDAGES/DRESSINGS) IMPLANT
ELECT PENCIL ROCKER SW 15FT (MISCELLANEOUS) ×4 IMPLANT
ELECT REM PT RETURN 15FT ADLT (MISCELLANEOUS) ×2 IMPLANT
EVACUATOR SILICONE 100CC (DRAIN) IMPLANT
GAUZE SPONGE 4X4 12PLY STRL (GAUZE/BANDAGES/DRESSINGS) IMPLANT
GLOVE BIO SURGEON STRL SZ 6.5 (GLOVE) ×4 IMPLANT
GLOVE BIOGEL PI IND STRL 7.0 (GLOVE) ×2 IMPLANT
GLOVE BIOGEL PI INDICATOR 7.0 (GLOVE) ×2
GOWN STRL REUS W/TWL 2XL LVL3 (GOWN DISPOSABLE) ×4 IMPLANT
GOWN STRL REUS W/TWL XL LVL3 (GOWN DISPOSABLE) ×8 IMPLANT
GRASPER ENDOPATH ANVIL 10MM (MISCELLANEOUS) ×2 IMPLANT
HOLDER FOLEY CATH W/STRAP (MISCELLANEOUS) ×2 IMPLANT
IRRIG SUCT STRYKERFLOW 2 WTIP (MISCELLANEOUS) ×2
IRRIGATION SUCT STRKRFLW 2 WTP (MISCELLANEOUS) ×1 IMPLANT
LEGGING LITHOTOMY PAIR STRL (DRAPES) IMPLANT
LUBRICANT JELLY K Y 4OZ (MISCELLANEOUS) ×2 IMPLANT
PACK COLON (CUSTOM PROCEDURE TRAY) ×2 IMPLANT
PAD POSITIONING PINK XL (MISCELLANEOUS) ×2 IMPLANT
PORT LAP GEL ALEXIS MED 5-9CM (MISCELLANEOUS) ×2 IMPLANT
POSITIONER SURGICAL ARM (MISCELLANEOUS) ×2 IMPLANT
RTRCTR WOUND ALEXIS 18CM MED (MISCELLANEOUS)
SCISSORS LAP 5X35 DISP (ENDOMECHANICALS) ×2 IMPLANT
SEALER TISSUE G2 STRG ARTC 35C (ENDOMECHANICALS) IMPLANT
SEALER TISSUE X1 CVD JAW (INSTRUMENTS) IMPLANT
SLEEVE XCEL OPT CAN 5 100 (ENDOMECHANICALS) ×8 IMPLANT
SPONGE DRAIN TRACH 4X4 STRL 2S (GAUZE/BANDAGES/DRESSINGS) IMPLANT
SPONGE LAP 18X18 X RAY DECT (DISPOSABLE) IMPLANT
STAPLER CIRC ILS CVD 33MM 37CM (STAPLE) ×2 IMPLANT
STAPLER CUT CVD 40MM BLUE (STAPLE) ×2 IMPLANT
STAPLER PROXIMATE 75MM BLUE (STAPLE) ×2 IMPLANT
STAPLER VISISTAT 35W (STAPLE) IMPLANT
SUT ETHILON 2 0 PS N (SUTURE) IMPLANT
SUT NOVA NAB GS-21 0 18 T12 DT (SUTURE) ×4 IMPLANT
SUT NOVA NAB GS-21 1 T12 (SUTURE) ×16 IMPLANT
SUT PDS AB 1 CTX 36 (SUTURE) IMPLANT
SUT PDS AB 1 TP1 96 (SUTURE) IMPLANT
SUT PROLENE 2 0 KS (SUTURE) ×2 IMPLANT
SUT SILK 2 0 (SUTURE) ×2
SUT SILK 2 0 SH CR/8 (SUTURE) ×2 IMPLANT
SUT SILK 2-0 18XBRD TIE 12 (SUTURE) ×1 IMPLANT
SUT SILK 3 0 (SUTURE) ×2
SUT SILK 3 0 SH CR/8 (SUTURE) ×2 IMPLANT
SUT SILK 3-0 18XBRD TIE 12 (SUTURE) ×1 IMPLANT
SUT VIC AB 2-0 SH 18 (SUTURE) ×4 IMPLANT
SUT VIC AB 4-0 PS2 27 (SUTURE) ×2 IMPLANT
SYS LAPSCP GELPORT 120MM (MISCELLANEOUS)
SYSTEM LAPSCP GELPORT 120MM (MISCELLANEOUS) IMPLANT
TOWEL OR NON WOVEN STRL DISP B (DISPOSABLE) ×2 IMPLANT
TRAY FOLEY W/METER SILVER 16FR (SET/KITS/TRAYS/PACK) IMPLANT
TROCAR BLADELESS OPT 5 100 (ENDOMECHANICALS) ×4 IMPLANT
TROCAR XCEL BLUNT TIP 100MML (ENDOMECHANICALS) IMPLANT
TUBING CONNECTING 10 (TUBING) ×2 IMPLANT
TUBING INSUF HEATED (TUBING) ×2 IMPLANT

## 2017-02-02 NOTE — Anesthesia Procedure Notes (Signed)
Procedure Name: Intubation Date/Time: 02/02/2017 9:39 AM Performed by: Carleene Cooper A Pre-anesthesia Checklist: Patient identified, Emergency Drugs available, Suction available, Patient being monitored and Timeout performed Patient Re-evaluated:Patient Re-evaluated prior to inductionOxygen Delivery Method: Circle system utilized Preoxygenation: Pre-oxygenation with 100% oxygen Intubation Type: IV induction Ventilation: Mask ventilation without difficulty Laryngoscope Size: Mac and 4 Grade View: Grade II Tube type: Oral Tube size: 7.5 mm Number of attempts: 1 Airway Equipment and Method: Stylet Placement Confirmation: ETT inserted through vocal cords under direct vision,  positive ETCO2 and breath sounds checked- equal and bilateral Secured at: 22 cm Tube secured with: Tape Dental Injury: Teeth and Oropharynx as per pre-operative assessment

## 2017-02-02 NOTE — Interval H&P Note (Signed)
History and Physical Interval Note:  02/02/2017 7:33 AM  Angel [name]  has presented today for surgery, with the diagnosis of colon cancer  The various methods of treatment have been discussed with the patient and family. After consideration of risks, benefits and other options for treatment, the patient has consented to  Procedure(s): LAPAROSCOPIC PARTIAL COLECTOMY (N/A) HERNIA REPAIR UMBILICAL ADULT (N/A) as a surgical intervention .  The patient's history has been reviewed, patient examined, no change in status, stable for surgery.  I have reviewed the patient's chart and labs.  Questions were answered to the patient's satisfaction.     Rosario Adie, MD  [facility]

## 2017-02-02 NOTE — H&P (View-Only) (Signed)
History of Present Illness Angel Ruff MD; 12/03/1094 9:57 AM) The patient is a 66 year old male who presents with colorectal cancer. 66 year old male who presents to the office for evaluation of a newly diagnosed colon cancer. He recently underwent a colonoscopy which showed cancer within a polyp that appears to be resected piecemeal. The area was then tattooed after the diagnosis of cancer was obtained. CT scans show no signs of metastatic disease in the liver. There are some small nodules in the lung that are felt to be benign but would require follow-up scan in approximately 6 months. He has no major symptoms and has regular bowel habits.   Past Surgical History Angel Lorenzo, [name]; 0/02/5408 8:11 AM) Appendectomy Colon Polyp Removal - Open  Diagnostic Studies History Angel Lorenzo, [name]; 07/29/4781 9:56 AM) Colonoscopy within last year  Allergies Angel Lorenzo, [name]; 12/29/3084 5:78 AM) Quinine Derivatives Itching.  Medication History Angel Lorenzo, [name]; 03/03/9628 5:28 AM) Atorvastatin Calcium (10MG  Tablet, Oral) Active. GlyBURIDE (5MG  Tablet, Oral) Active. Losartan Potassium-HCTZ (50-12.5MG  Tablet, Oral) Active. Omeprazole (20MG  Capsule DR, Oral) Active. OneTouch Ultra Blue (In Vitro) Active. Sildenafil Citrate (20MG  Tablet, Oral) Active. Flonase Allergy Relief (50MCG/ACT Suspension, Nasal) Active. Aspirin (81MG  Tablet DR, Oral) Active. Multivitamin Adult (Oral) Active. Medications Reconciled  Social History Angel Lorenzo, [name]; 02/27/3243 0:10 AM) Alcohol use Occasional alcohol use. Caffeine use Carbonated beverages, Coffee. No drug use Tobacco use Former smoker.  Family History Angel Lorenzo, [name]; 01/04/2535 6:44 AM) Heart Disease Mother. Hypertension Brother, Mother, Sister.  Other Problems Angel Lorenzo, [name]; 0/01/4741 5:95 AM) Diabetes Mellitus High blood pressure Hypercholesterolemia     Review of Systems Angel Billings Dockery [name]; 05/01/8755  4:33 AM) General Not Present- Appetite Loss, Chills, Fatigue, Fever, Night Sweats, Weight Gain and Weight Loss. Skin Not Present- Change in Wart/Mole, Dryness, Hives, Jaundice, New Lesions, Non-Healing Wounds, Rash and Ulcer. HEENT Present- Wears glasses/contact lenses. Not Present- Earache, Hearing Loss, Hoarseness, Nose Bleed, Oral Ulcers, Ringing in the Ears, Seasonal Allergies, Sinus Pain, Sore Throat, Visual Disturbances and Yellow Eyes. Cardiovascular Not Present- Chest Pain, Difficulty Breathing Lying Down, Leg Cramps, Palpitations, Rapid Heart Rate, Shortness of Breath and Swelling of Extremities. Gastrointestinal Not Present- Abdominal Pain, Bloating, Bloody Stool, Change in Bowel Habits, Chronic diarrhea, Constipation, Difficulty Swallowing, Excessive gas, Gets full quickly at meals, Hemorrhoids, Indigestion, Nausea, Rectal Pain and Vomiting. Musculoskeletal Present- Back Pain and Joint Pain. Not Present- Joint Stiffness, Muscle Pain, Muscle Weakness and Swelling of Extremities.  Vitals Angel Billings Dockery [name]; 01/06/5187 4:16 AM) 01/03/2017 9:41 AM Weight: 263.6 lb Height: 69.5in Body Surface Area: 2.34 m Body Mass Index: 38.37 kg/m  Temp.: 98.20F(Oral)  Pulse: 83 (Regular)  BP: 122/74 (Sitting, Left Arm, Standard)      Physical Exam Angel Ruff MD; 6/0/6301 9:58 AM)  General Mental Status-Alert. General Appearance-Not in acute distress. Build & Nutrition-Well nourished. Posture-Normal posture. Gait-Normal.  Head and Neck Head-normocephalic, atraumatic with no lesions or palpable masses. Trachea-midline.  Chest and Lung Exam Chest and lung exam reveals -on auscultation, normal breath sounds, no adventitious sounds and normal vocal resonance.  Cardiovascular Cardiovascular examination reveals -normal heart sounds, regular rate and rhythm with no murmurs and no digital clubbing, cyanosis, edema, increased warmth or  tenderness.  Abdomen Inspection Hernias - Umbilical hernia - Reducible. Note: small. Palpation/Percussion Palpation and Percussion of the abdomen reveal - Soft, Non Tender, No Rigidity (guarding), No hepatosplenomegaly and No Palpable abdominal masses.  Neurologic Neurologic evaluation reveals -alert and oriented x 3 with no impairment of recent  or remote memory, normal attention span and ability to concentrate, normal sensation and normal coordination.  Musculoskeletal Normal Exam - Bilateral-Upper Extremity Strength Normal and Lower Extremity Strength Normal.    Assessment & Plan Angel Ruff MD; 01/01/2682 9:54 AM)  CANCER OF SIGMOID COLON (C18.7) Impression: 66 year old male who recently underwent a colonoscopy by Dr. Wynetta Emery. A polyp was removed from the sigmoid colon which showed signs of invasive adenocarcinoma. Margins were involved. The area has been tattooed. CT scan showed no evidence of metastatic disease but he will need a follow-up chest CT in about 6 months. We will plan on proceeding with a minimally invasive partial colectomy. The surgery and anatomy were described to the patient as well as the risks of surgery and the possible complications. These include: Bleeding, deep abdominal infections and possible wound complications such as hernia and infection, damage to adjacent structures, leak of surgical connections, which can lead to other surgeries and possibly an ostomy, possible need for other procedures, such as abscess drains in radiology, possible prolonged hospital stay, possible diarrhea from removal of part of the colon, possible constipation from narcotics, possible bowel, bladder or sexual dysfunction if having rectal surgery, prolonged fatigue/weakness or appetite loss, possible early recurrence of of disease, possible complications of their medical problems such as heart disease or arrhythmias or lung problems, death (less than 1%). I believe the patient  understands and wishes to proceed with the surgery.

## 2017-02-02 NOTE — Op Note (Signed)
02/02/2017  3:15 PM  PATIENT:  Angel [name]  66 y.o. male  Patient Care Team: Seward Carol, MD as PCP - General (Internal Medicine)  PRE-OPERATIVE DIAGNOSIS:  colon cancer  POST-OPERATIVE DIAGNOSIS:  colon cancer  PROCEDURE:   LAPAROSCOPIC PARTIAL COLECTOMY HERNIA REPAIR UMBILICAL ADULT  SURGEON:  Surgeon(s): Leighton Ruff, MD Alphonsa Overall, MD  ASSISTANT: Dr Lucia Gaskins   ANESTHESIA:   general  EBL:  Total I/O In: 2500 [I.V.:2500] Out: 240 [Urine:190; Blood:50]  DRAINS: none   SPECIMEN:  Source of Specimen:  sigmoid colon  DISPOSITION OF SPECIMEN:  PATHOLOGY  COUNTS:  YES  PLAN OF CARE: Admit to inpatient   PATIENT DISPOSITION:  PACU - hemodynamically stable.  INDICATION: 66 y.o. M with colon cancer found in resected polyp.  He is here for definitive surgery.   OR FINDINGS: pelvic kidney with sigmoid colon displaced to the right side.  DESCRIPTION: the patient was identified in the preoperative holding area and taken to the OR where they were laid supine on the operating room table.  General anesthesia was induced without difficulty. SCDs were also noted to be in place prior to the initiation of anesthesia.  The patient was placed in lithotomy position and a foley was inserted sterilely.  The patient was then prepped and draped in the usual sterile fashion.   A surgical timeout was performed indicating the correct patient, procedure, positioning and need for preoperative antibiotics.   I used a varies needle to insufflate the abdomen from the LUQ.  Lap ports were then placed.  A camera was inserted.  The colon could be traced from the rectum up the right side of the patient's abdomen.  The tattoo was noted in the portion of the colon that went up to the RUQ.  I took down the adhesions and mobilized this colon to midline. I freed this area from the liver.  I identified the duodenum in its usual position and mobilized the colon off of the retroperitoneum.  I then freed  the medial tissue where the colon was adherent to his pelvic kidney.  I continued to trace his colon into the LUQ mobilizing this away from the transverse colon as I went.  I took down his splenic flexure as well using the enseal device.  This allowed for the colon to mobilize into the pelvis.  At this point, I enlarged my midline incision and placed the alexis retractor.  I brought out the colon and transected this with a 75 mm blue load stapler proximal to the tattooed area.  I then began to divide the mesentery with the enseal at what appeared to be the base of the mesentery.  I continued down this mesentery until I reached the what appeared to be the rectosigmoid junction.  I did not explore the rectosigmoid mesentery to avoid possible injury to the pelvic kidney ureter as it would not be in its normal anatomical position.  I dived the rectosigmoid with a blue load contour device.  The specimen was sent to pathology.  I placed an EEA anvil in the remaining colon and tied a pursestring suture around this.  I then laparoscopically mobilized the remaining pelvic kidney attachments to allow for the colon to reach the pelvis.  An anastomosis was created with the EEA.  There was no leak when tested under insufflation.  The abdomen was irrigated.  We then removed the ports and switched to clean gown, gloves, instruments and draped.    I removed the hernia  sac from the umbilical wound.  I mobilized the fascia on both sides and then closed this primarily with interrupted #1 Novofil sutures.  The subcutaneous tissue was closed with 2-0 Vicryl sutures.  The skin was closed with a 4-0 running suture.  The port sites were closed as well.  Dressings were applied.  The patient was awakened and sent to the PACU in stable condition.  All counts were correct per OR staff.

## 2017-02-02 NOTE — Progress Notes (Signed)
Pt's honeycomb dressing was leaking a small to moderate amount after taking a walk.  It was reinforced and we will continue to monitor.

## 2017-02-02 NOTE — Transfer of Care (Signed)
Immediate Anesthesia Transfer of Care Note  Patient: Angel [name]  Procedure(s) Performed: Procedure(s): LAPAROSCOPIC PARTIAL COLECTOMY (N/A) HERNIA REPAIR UMBILICAL ADULT (N/A)  Patient Location: PACU  Anesthesia Type:General  Level of Consciousness: awake, alert , oriented and patient cooperative  Airway & Oxygen Therapy: Patient Spontanous Breathing and Patient connected to face mask oxygen  Post-op Assessment: Report given to RN and Post -op Vital signs reviewed and stable  Post vital signs: Reviewed and stable  Last Vitals:  Vitals:   02/02/17 0754  BP: (!) 143/88  Pulse: 89  Resp: 18  Temp: 36.5 C    Last Pain:  Vitals:   02/02/17 0754  TempSrc: Oral      Patients Stated Pain Goal: 5 (03/55/97 4163)  Complications: No apparent anesthesia complications

## 2017-02-02 NOTE — Anesthesia Preprocedure Evaluation (Addendum)
Anesthesia Evaluation  Patient identified by MRN, date of birth, ID band Patient awake    Reviewed: Allergy & Precautions, NPO status , Patient's Chart, lab work & pertinent test results  History of Anesthesia Complications Negative for: history of anesthetic complications  Airway Mallampati: II  TM Distance: >3 FB Neck ROM: Full    Dental  (+) Dental Advisory Given,    Pulmonary neg pulmonary ROS,    Pulmonary exam normal breath sounds clear to auscultation       Cardiovascular hypertension, Pt. on medications Normal cardiovascular exam Rhythm:Regular     Neuro/Psych negative neurological ROS  negative psych ROS   GI/Hepatic negative GI ROS, Neg liver ROS,   Endo/Other  diabetes, Type 2, Oral Hypoglycemic AgentsMorbid obesity  Renal/GU negative Renal ROS     Musculoskeletal negative musculoskeletal ROS (+)   Abdominal   Peds  Hematology negative hematology ROS (+)   Anesthesia Other Findings   Reproductive/Obstetrics                            Anesthesia Physical  Anesthesia Plan  ASA: III  Anesthesia Plan: General   Post-op Pain Management:    Induction: Intravenous  Airway Management Planned: Oral ETT  Additional Equipment:   Intra-op Plan:   Post-operative Plan: Extubation in OR  Informed Consent: I have reviewed the patients History and Physical, chart, labs and discussed the procedure including the risks, benefits and alternatives for the proposed anesthesia with the patient or authorized representative who has indicated his/her understanding and acceptance.   Dental advisory given  Plan Discussed with: CRNA  Anesthesia Plan Comments:        Anesthesia Quick Evaluation

## 2017-02-02 NOTE — Anesthesia Postprocedure Evaluation (Signed)
Anesthesia Post Note  Patient: RAHIM ASTORGA  Procedure(s) Performed: Procedure(s) (LRB): LAPAROSCOPIC PARTIAL COLECTOMY (N/A) HERNIA REPAIR UMBILICAL ADULT (N/A)  Patient location during evaluation: PACU Anesthesia Type: General Level of consciousness: sedated and patient cooperative Pain management: pain level controlled Vital Signs Assessment: post-procedure vital signs reviewed and stable Respiratory status: spontaneous breathing Cardiovascular status: stable Anesthetic complications: no       Last Vitals:  Vitals:   02/02/17 1430 02/02/17 1449  BP: (!) 146/87 (!) 142/92  Pulse: 83 78  Resp: 14 14  Temp:  36.4 C    Last Pain:  Vitals:   02/02/17 1429  TempSrc:   PainSc: [place]

## 2017-02-03 ENCOUNTER — Encounter (HOSPITAL_COMMUNITY): Payer: Self-pay | Admitting: General Surgery

## 2017-02-03 LAB — CBC
HEMATOCRIT: 40.5 % (ref 39.0–52.0)
HEMOGLOBIN: 13.1 g/dL (ref 13.0–17.0)
MCH: 23.9 pg — ABNORMAL LOW (ref 26.0–34.0)
MCHC: 32.3 g/dL (ref 30.0–36.0)
MCV: 73.8 fL — AB (ref 78.0–100.0)
Platelets: 132 10*3/uL — ABNORMAL LOW (ref 150–400)
RBC: 5.49 MIL/uL (ref 4.22–5.81)
RDW: 15.2 % (ref 11.5–15.5)
WBC: 5 10*3/uL (ref 4.0–10.5)

## 2017-02-03 LAB — BASIC METABOLIC PANEL
ANION GAP: 6 (ref 5–15)
BUN: 8 mg/dL (ref 6–20)
CHLORIDE: 104 mmol/L (ref 101–111)
CO2: 27 mmol/L (ref 22–32)
Calcium: 8.6 mg/dL — ABNORMAL LOW (ref 8.9–10.3)
Creatinine, Ser: 1.01 mg/dL (ref 0.61–1.24)
GFR calc Af Amer: >60 mL/min (ref >60)
GLUCOSE: 174 mg/dL — AB (ref 65–99)
POTASSIUM: 3.7 mmol/L (ref 3.5–5.1)
Sodium: 137 mmol/L (ref 135–145)

## 2017-02-03 LAB — TYPE AND SCREEN
ABO/RH(D): O POS
ANTIBODY SCREEN: NEGATIVE

## 2017-02-03 LAB — GLUCOSE, CAPILLARY
GLUCOSE-CAPILLARY: 169 mg/dL — AB (ref 65–99)
GLUCOSE-CAPILLARY: 152 mg/dL — AB (ref 65–99)
GLUCOSE-CAPILLARY: 104 mg/dL — AB (ref 65–99)
GLUCOSE-CAPILLARY: 118 mg/dL — AB (ref 65–99)

## 2017-02-03 NOTE — Discharge Instructions (Signed)

## 2017-02-03 NOTE — Progress Notes (Signed)
1 Day Post-Op lap partial colectomy and umbilical hernia repair Subjective: Pt doing ok this am.  No nausea.  Pain controlled.  Ambulated last night  Objective: Vital signs in last 24 hours: Temp:  [97.6 F (36.4 C)-98.8 F (37.1 C)] 98.8 F (37.1 C) (03/09 0515) Pulse Rate:  [68-89] 74 (03/09 0515) Resp:  [13-19] 18 (03/09 0515) BP: (123-157)/(69-98) 143/80 (03/09 0515) SpO2:  [99 %-100 %] 100 % (03/09 0515) Weight:  [117 kg (258 lb)] 117 kg (258 lb) (03/08 0753)   Intake/Output from previous day: 03/08 0701 - 03/09 0700 In: 3821.7 [P.O.:60; I.V.:3761.7] Out: 1740 [Urine:1690; Blood:50] Intake/Output this shift: No intake/output data recorded.   General appearance: alert and cooperative GI: normal findings: soft, mildly distended  Incision: bloody drainage present  Lab Results:   Recent Labs  01/31/17 1058  WBC 5.1  HGB 14.7  HCT 44.9  PLT 136*   BMET  Recent Labs  01/31/17 1058 02/03/17 0548  NA 138 137  K 4.1 3.7  CL 103 104  CO2 29 27  GLUCOSE 171* 174*  BUN 12 8  CREATININE 0.95 1.01  CALCIUM 9.1 8.6*   PT/INR No results for input(s): LABPROT, INR in the last 72 hours. ABG No results for input(s): PHART, HCO3 in the last 72 hours.  Invalid input(s): PCO2, PO2  MEDS, Scheduled . acetaminophen  1,000 mg Oral Q6H  . alvimopan  12 mg Oral BID  . atorvastatin  10 mg Oral Daily  . enoxaparin (LOVENOX) injection  40 mg Subcutaneous Q24H  . glyBURIDE  10 mg Oral BID AC  . hydrochlorothiazide  12.5 mg Oral Daily   And  . losartan  50 mg Oral Daily  . insulin aspart  0-20 Units Subcutaneous TID WC  . insulin aspart  0-5 Units Subcutaneous QHS  . [START ON 02/05/2017] metFORMIN  1,000 mg Oral BID WC  . pantoprazole  40 mg Oral Daily  . saccharomyces boulardii  250 mg Oral BID    Studies/Results: No results found.  Assessment: s/p Procedure(s): LAPAROSCOPIC PARTIAL COLECTOMY HERNIA REPAIR UMBILICAL ADULT Patient Active Problem List   Diagnosis Date Noted  . Colon cancer ([place]) 02/02/2017    Expected post op course  Plan: d/c foley Advance diet to clears, fulls if tolerated Ambulate Min IVF's   LOS: 1 day     .Rosario Adie, [facility], [place]   02/03/2017 7:40 AM

## 2017-02-04 LAB — BASIC METABOLIC PANEL
Anion gap: 6 (ref 5–15)
BUN: 6 mg/dL (ref 6–20)
CALCIUM: 8.8 mg/dL — AB (ref 8.9–10.3)
CHLORIDE: 102 mmol/L (ref 101–111)
CO2: 28 mmol/L (ref 22–32)
Creatinine, Ser: 1.12 mg/dL (ref 0.61–1.24)
GFR calc Af Amer: >60 mL/min (ref >60)
GFR calc non Af Amer: >60 mL/min (ref >60)
GLUCOSE: 141 mg/dL — AB (ref 65–99)
Potassium: 3.3 mmol/L — ABNORMAL LOW (ref 3.5–5.1)
Sodium: 136 mmol/L (ref 135–145)

## 2017-02-04 LAB — CBC
HEMATOCRIT: 41.9 % (ref 39.0–52.0)
HEMOGLOBIN: 13.3 g/dL (ref 13.0–17.0)
MCH: 23.6 pg — AB (ref 26.0–34.0)
MCHC: 31.7 g/dL (ref 30.0–36.0)
MCV: 74.3 fL — AB (ref 78.0–100.0)
Platelets: 137 10*3/uL — ABNORMAL LOW (ref 150–400)
RBC: 5.64 MIL/uL (ref 4.22–5.81)
RDW: 15.1 % (ref 11.5–15.5)
WBC: 5.2 10*3/uL (ref 4.0–10.5)

## 2017-02-04 NOTE — Progress Notes (Signed)
Patient ID: Angel [name], male   DOB: [birth year], 66 y.o.   MRN: [id number] [facility] Progress Note:   2 Days Post-Op  Subjective: Mental status is alert;  Taking liquids Objective: Vital signs in last 24 hours: Temp:  [98.4 F (36.9 C)-99 F (37.2 C)] 98.5 F (36.9 C) (03/10 0610) Pulse Rate:  [66-78] 75 (03/10 0610) Resp:  [17-18] 18 (03/10 0610) BP: (122-139)/(74-86) 139/82 (03/10 0610) SpO2:  [96 %-99 %] 98 % (03/10 0610)  Intake/Output from previous day: 03/09 0701 - 03/10 0700 In: 1800 [P.O.:1200; I.V.:600] Out: 2850 [Urine:2850] Intake/Output this shift: No intake/output data recorded.  Physical Exam: Work of breathing is normal.  Abdomen is mildly distended but nontender;  Flatus but no BM  Lab Results:  Results for orders placed or performed during the hospital encounter of 02/02/17 (from the past 48 hour(s))  Glucose, capillary     Status: Abnormal   Collection Time: 02/02/17  1:25 PM  Result Value Ref Range   Glucose-Capillary 135 (H) 65 - 99 mg/dL  Glucose, capillary     Status: Abnormal   Collection Time: 02/02/17  4:34 PM  Result Value Ref Range   Glucose-Capillary 146 (H) 65 - 99 mg/dL  Glucose, capillary     Status: Abnormal   Collection Time: 02/02/17  9:50 PM  Result Value Ref Range   Glucose-Capillary 139 (H) 65 - 99 mg/dL  Basic metabolic panel     Status: Abnormal   Collection Time: 02/03/17  5:48 AM  Result Value Ref Range   Sodium 137 135 - 145 mmol/L   Potassium 3.7 3.5 - 5.1 mmol/L   Chloride 104 101 - 111 mmol/L   CO2 27 22 - 32 mmol/L   Glucose, Bld 174 (H) 65 - 99 mg/dL   BUN 8 6 - 20 mg/dL   Creatinine, Ser 1.01 0.61 - 1.24 mg/dL   Calcium 8.6 (L) 8.9 - 10.3 mg/dL   GFR calc non Af Amer >60 >60 mL/min   GFR calc Af Amer >60 >60 mL/min    Comment: (NOTE) The eGFR has been calculated using the CKD EPI equation. This calculation has not been validated in all clinical situations. eGFR's persistently <60 mL/min signify  possible Chronic Kidney Disease.    Anion gap 6 5 - 15  CBC     Status: Abnormal   Collection Time: 02/03/17  5:48 AM  Result Value Ref Range   WBC 5.0 4.0 - 10.5 K/uL   RBC 5.49 4.22 - 5.81 MIL/uL   Hemoglobin 13.1 13.0 - 17.0 g/dL   HCT 40.5 39.0 - 52.0 %   MCV 73.8 (L) 78.0 - 100.0 fL   MCH 23.9 (L) 26.0 - 34.0 pg   MCHC 32.3 30.0 - 36.0 g/dL   RDW 15.2 11.5 - 15.5 %   Platelets 132 (L) 150 - 400 K/uL  Glucose, capillary     Status: Abnormal   Collection Time: 02/03/17  7:54 AM  Result Value Ref Range   Glucose-Capillary 169 (H) 65 - 99 mg/dL  Glucose, capillary     Status: Abnormal   Collection Time: 02/03/17 11:52 AM  Result Value Ref Range   Glucose-Capillary 152 (H) 65 - 99 mg/dL  Glucose, capillary     Status: Abnormal   Collection Time: 02/03/17  5:56 PM  Result Value Ref Range   Glucose-Capillary 104 (H) 65 - 99 mg/dL  Glucose, capillary     Status: Abnormal   Collection Time: 02/03/17 10:13 PM  Result Value Ref Range   Glucose-Capillary 118 (H) 65 - 99 mg/dL  Basic metabolic panel     Status: Abnormal   Collection Time: 02/04/17  5:15 AM  Result Value Ref Range   Sodium 136 135 - 145 mmol/L   Potassium 3.3 (L) 3.5 - 5.1 mmol/L   Chloride 102 101 - 111 mmol/L   CO2 28 22 - 32 mmol/L   Glucose, Bld 141 (H) 65 - 99 mg/dL   BUN 6 6 - 20 mg/dL   Creatinine, Ser 1.12 0.61 - 1.24 mg/dL   Calcium 8.8 (L) 8.9 - 10.3 mg/dL   GFR calc non Af Amer >60 >60 mL/min   GFR calc Af Amer >60 >60 mL/min    Comment: (NOTE) The eGFR has been calculated using the CKD EPI equation. This calculation has not been validated in all clinical situations. eGFR's persistently <60 mL/min signify possible Chronic Kidney Disease.    Anion gap 6 5 - 15  CBC     Status: Abnormal   Collection Time: 02/04/17  5:15 AM  Result Value Ref Range   WBC 5.2 4.0 - 10.5 K/uL   RBC 5.64 4.22 - 5.81 MIL/uL   Hemoglobin 13.3 13.0 - 17.0 g/dL   HCT 41.9 39.0 - 52.0 %   MCV 74.3 (L) 78.0 - 100.0 fL    MCH 23.6 (L) 26.0 - 34.0 pg   MCHC 31.7 30.0 - 36.0 g/dL   RDW 15.1 11.5 - 15.5 %   Platelets 137 (L) 150 - 400 K/uL    Radiology/Results: No results found.  Anti-infectives: Anti-infectives    Start     Dose/Rate Route Frequency Ordered Stop   02/02/17 2200  cefoTEtan (CEFOTAN) 2 g in dextrose 5 % 50 mL IVPB     2 g 100 mL/hr over 30 Minutes Intravenous Every 12 hours 02/02/17 1511 02/02/17 2230   02/02/17 0745  cefoTEtan in Dextrose 5% (CEFOTAN) IVPB 2 g     2 g Intravenous On call to O.R. 02/02/17 0732 02/02/17 0940   02/02/17 0725  cefoTEtan (CEFOTAN) 2 g in dextrose 5 % 50 mL IVPB  Status:  Discontinued     2 g 100 mL/hr over 30 Minutes Intravenous On call to O.R. 02/02/17 0725 02/02/17 0732      Assessment/Plan: Problem List: Patient Active Problem List   Diagnosis Date Noted  . Colon cancer ([place]) 02/02/2017    Doing well;  Hopeful discharge tomorrow 2 Days Post-Op    LOS: 2 days   Matt B. Hassell Done, MD, [facility] [phone] beeper [phone]  02/04/2017 8:53 AM

## 2017-02-05 LAB — BASIC METABOLIC PANEL
Anion gap: 8 (ref 5–15)
BUN: 6 mg/dL (ref 6–20)
CALCIUM: 9 mg/dL (ref 8.9–10.3)
CO2: 26 mmol/L (ref 22–32)
CREATININE: 0.99 mg/dL (ref 0.61–1.24)
Chloride: 103 mmol/L (ref 101–111)
GFR calc Af Amer: >60 mL/min (ref >60)
GFR calc non Af Amer: >60 mL/min (ref >60)
GLUCOSE: 133 mg/dL — AB (ref 65–99)
Potassium: 3.5 mmol/L (ref 3.5–5.1)
Sodium: 137 mmol/L (ref 135–145)

## 2017-02-05 LAB — CBC
HCT: 41 % (ref 39.0–52.0)
HEMOGLOBIN: 13.2 g/dL (ref 13.0–17.0)
MCH: 23.7 pg — AB (ref 26.0–34.0)
MCHC: 32.2 g/dL (ref 30.0–36.0)
MCV: 73.5 fL — ABNORMAL LOW (ref 78.0–100.0)
PLATELETS: 138 10*3/uL — AB (ref 150–400)
RBC: 5.58 MIL/uL (ref 4.22–5.81)
RDW: 14.8 % (ref 11.5–15.5)
WBC: 5.5 10*3/uL (ref 4.0–10.5)

## 2017-02-05 MED ORDER — HYDROCODONE-ACETAMINOPHEN 5-325 MG PO TABS: 1.0000 | ORAL_TABLET | Freq: Four times a day (QID) | ORAL | 0 refills | Status: DC | PRN

## 2017-02-05 NOTE — Discharge Summary (Signed)
Physician Discharge Summary  Patient ID: Angel [name] MRN: [id number] DOB/AGE: 1951/01/06 66 y.o.  Admit date: 02/02/2017 Discharge date: 02/05/2017  Admission Diagnoses:  Colon cancer  Discharge Diagnoses:  came  Active Problems:   Colon cancer [facility])   Surgery:  LAPAROSCOPIC PARTIAL COLECTOMY HERNIA REPAIR UMBILICAL ADULT  Discharged Condition: improved  Hospital Course:   Had surgery by Dr. Marcello Moores.  Did well and begun on   Consults: none  Significant Diagnostic Studies: path pending    Discharge Exam: Blood pressure 133/83, pulse 65, temperature 98 F (36.7 C), temperature source Oral, resp. rate 18, height 5\' 9"  (1.753 m), weight 117 kg (258 lb), SpO2 97 %. Incisions OK.    Disposition: 01-Home or Self Care  Discharge Instructions    Call MD for:  persistant nausea and vomiting    Complete by:  As directed    Call MD for:  redness, tenderness, or signs of infection (pain, swelling, redness, odor or green/yellow discharge around incision site)    Complete by:  As directed    Call MD for:  severe uncontrolled pain    Complete by:  As directed    Diet - low sodium heart healthy    Complete by:  As directed    Discharge instructions    Complete by:  As directed    May shower and get incisions wel   Increase activity slowly    Complete by:  As directed      Allergies as of 02/05/2017      Reactions   Quinine Derivatives Itching      Medication List    TAKE these medications   aspirin EC 81 MG tablet Take 81 mg by mouth daily.   atorvastatin 10 MG tablet Commonly known as:  LIPITOR Take 10 mg by mouth daily.   CALCIUM PO Take 1 tablet by mouth daily.   GLUCOSAMINE PO Take 1 tablet by mouth every other day.   glyBURIDE 5 MG tablet Commonly known as:  DIABETA Take 10 mg by mouth 2 (two) times daily.   HYDROcodone-acetaminophen 5-325 MG tablet Commonly known as:  NORCO/VICODIN Take 1 tablet by mouth every 6 (six) hours as needed for moderate  pain.   ibuprofen 200 MG tablet Commonly known as:  ADVIL,MOTRIN Take 400-600 mg by mouth every 6 (six) hours as needed (for pain.).   losartan-hydrochlorothiazide 50-12.5 MG tablet Commonly known as:  HYZAAR Take 1 tablet by mouth daily.   metFORMIN 1000 MG tablet Commonly known as:  GLUCOPHAGE Take 1,000 mg by mouth 2 (two) times daily with a meal.   multivitamin with minerals Tabs tablet Take 1 tablet by mouth daily. One-A-Day for Men   omeprazole 20 MG capsule Commonly known as:  PRILOSEC Take 20 mg by mouth daily as needed (for acid reflux/heartburn).   sildenafil 20 MG tablet Commonly known as:  REVATIO Take 20-100 mg by mouth daily as needed for erectile dysfunction.      Follow-up Information    Rosario Adie., MD. Schedule an appointment as soon as possible for a visit in 2 week(s).   Specialty:  General Surgery Contact information: [address]  [place] [address] [phone]           Signed: Pedro Earls 02/05/2017, 11:31 AM

## 2017-02-05 NOTE — Progress Notes (Signed)
Patient was given discharge instructions and prescription. All questions were answered and patient was taken via wheelchair to main entrance by NT. [place]

## 2017-02-06 LAB — GLUCOSE, CAPILLARY
GLUCOSE-CAPILLARY: 186 mg/dL — AB (ref 65–99)
GLUCOSE-CAPILLARY: 118 mg/dL — AB (ref 65–99)
GLUCOSE-CAPILLARY: 85 mg/dL (ref 65–99)
GLUCOSE-CAPILLARY: 145 mg/dL — AB (ref 65–99)
Glucose-Capillary: 126 mg/dL — ABNORMAL HIGH (ref 65–99)

## 2017-02-07 ENCOUNTER — Other Ambulatory Visit (HOSPITAL_COMMUNITY)
Admission: RE | Admit: 2017-02-07 | Discharge: 2017-02-07 | Disposition: A | Discharge status: Home / Self Care | Payer: 59 | Source: Ambulatory Visit | Attending: Gastroenterology | Admitting: Gastroenterology

## 2017-02-07 DIAGNOSIS — C189 Malignant neoplasm of colon, unspecified: Secondary | ICD-10-CM | POA: Diagnosis present

## 2017-02-12 ENCOUNTER — Encounter: Payer: Self-pay | Admitting: Oncology

## 2017-02-12 ENCOUNTER — Telehealth: Payer: Self-pay | Admitting: Oncology

## 2017-02-12 NOTE — Telephone Encounter (Signed)
Appt has been scheduled for the pt to see Dr. Benay Spice on 3/26 at 2pm. Will mail the pt a letter with the appt date and time.

## 2017-02-14 ENCOUNTER — Telehealth: Payer: Self-pay | Admitting: Oncology

## 2017-02-14 NOTE — Telephone Encounter (Signed)
Cld the pt to give the appt for him to see Dr. Benay Spice on 3/26 at 2pm. Pt aware to arrive 30 minutes early. Pt voiced understanding.

## 2017-02-20 ENCOUNTER — Ambulatory Visit (HOSPITAL_BASED_OUTPATIENT_CLINIC_OR_DEPARTMENT_OTHER): Payer: 59 | Admitting: Oncology

## 2017-02-20 ENCOUNTER — Telehealth: Payer: Self-pay | Admitting: Oncology

## 2017-02-20 VITALS — BP 132/80 | HR 78 | Temp 98.3°F | Resp 19 | Ht 69.0 in | Wt 259.2 lb

## 2017-02-20 DIAGNOSIS — C773 Secondary and unspecified malignant neoplasm of axilla and upper limb lymph nodes: Secondary | ICD-10-CM | POA: Diagnosis not present

## 2017-02-20 DIAGNOSIS — C187 Malignant neoplasm of sigmoid colon: Secondary | ICD-10-CM | POA: Diagnosis not present

## 2017-02-20 NOTE — Progress Notes (Signed)
START ON PATHWAY REGIMEN - Colorectal     A cycle is every 21 days:     Capecitabine      Oxaliplatin   **Always confirm dose/schedule in your pharmacy ordering system**    Patient Characteristics: Colon Adjuvant, Stage III, Low Risk (T1-3, N1) Current evidence of distant metastases? No AJCC T Category: [place] AJCC N Category: NX AJCC M Category: Staged < 8th Ed. AJCC 8 Stage Grouping: IIIA  Intent of Therapy: Curative Intent, Discussed with Patient

## 2017-02-20 NOTE — Progress Notes (Signed)
[facility] Patient Consult   Referring MD: Daundre Biel 66 y.o.  1951/05/21    Reason for Referral: Colon cancer   HPI: Mr. Nygard underwent a screening colonoscopy by Dr. Wynetta Emery on 12/19/2016. A 10 mm polyp was removed from the mid sigmoid colon. A 5 mm polyp was removed from the distal sigmoid colon. A 5 mm polyp was removed from the mid descending colon. Adenocarcinoma was noted at the cauterized edge of the largest polypoid tissue. The tumor returned MSI-stable with no loss of mismatch repair protein expression.  He was taken to a flexible sigmoidoscopy 12/20/2016. The malignant colon polypectomy site at 55 cm from the anal verge was injected with in the ink. An Endo Clip was applied to the polypectomy site.  Mr. Wence underwent staging CT scans on 12/29/2016. Several sub-centimeter pulmonary nodules were noted in the left upper and lower lobes with the largest measuring 5 mm. No liver mass. No pathologically enlarged lymph nodes. No bowel mass noted.  He was referred to Dr. Marcello Moores and was taken to the operating room for a laparoscopic partial colectomy and umbilical hernia repair on 02/02/2017. The colon was transected proximal to the tattoo area and at the rectosigmoid. The pathology [id number]) confirmed microscopic residual adenocarcinoma of the sigmoid colon, grade 2, tumor arose in a tubular adenoma with invasion of the submucosa. No lymphovascular or perineural invasion. No tumor deposit. The resection margins were negative. Metastatic adenocarcinoma was identified in 1 of 26 lymph nodes. No tumor deposits.  He is referred for oncology consultation. He reports feeling well prior to surgery.   Past Medical History:  Diagnosis Date  . Colon cancer, sigmoid, (T1 N1)  02/02/2017      . Diabetes mellitus without complication ([place])    type 2  . Hypertension     Past Surgical History:  Procedure Laterality Date  . APPENDECTOMY      . COLONOSCOPY WITH PROPOFOL N/A 12/19/2016   Procedure: COLONOSCOPY WITH PROPOFOL;  Surgeon: Garlan Fair, MD;  Location: [facility];  Service: Endoscopy;  Laterality: N/A;  . FLEXIBLE SIGMOIDOSCOPY N/A 12/20/2016   Procedure: FLEXIBLE SIGMOIDOSCOPY;  Surgeon: Garlan Fair, MD;  Location: [facility];  Service: Endoscopy;  Laterality: N/A;  . LAPAROSCOPIC PARTIAL COLECTOMY N/A 02/02/2017   Procedure: LAPAROSCOPIC PARTIAL COLECTOMY;  Surgeon: Leighton Ruff, MD;  Location: [facility];  Service: General;  Laterality: N/A;  . UMBILICAL HERNIA REPAIR N/A 02/02/2017   Procedure: HERNIA REPAIR UMBILICAL ADULT;  Surgeon: Leighton Ruff, MD;  Location: [facility];  Service: General;  Laterality: N/A;    Medications: Reviewed  Allergies:  Allergies  Allergen Reactions  . Quinine Derivatives Itching    Family history: His mother had colon cancer in her 44s. A maternal uncle had colon cancer at age 68. He has 4 brothers and 4 sisters. 2 children. No other family history of cancer.  Social History:   He moved to [place] from [place] and 1977. He lives with his wife in [place]. He works as a Forensic scientist. He does not use cigarettes. He reports social alcohol use. No transfusion history. No risk factor for HIV or hepatitis.     ROS:   Positives include: None  A complete ROS was otherwise negative.  Physical Exam:  Blood pressure 132/80, pulse 78, temperature 98.3 F (36.8 C), temperature source Oral, resp. rate 19, height _0  (1.753 m), weight 259 lb 3.2 oz (117.6 kg), SpO2 100 %.  HEENT:  Oropharynx without visible mass, neck without mass Lungs: Clear bilaterally Cardiac: Regular rate and rhythm Abdomen: No hepatosplenomegaly, no mass, nontender GU: Testes without mass  Vascular: No leg edema Lymph nodes: No cervical, supraclavicular, axillary, or inguinal nodes Neurologic: Alert and oriented, the motor exam appears intact in the upper and lower extremities Skin: No  rash Musculoskeletal: No spine tenderness   LAB:  CBC  Lab Results  Component Value Date   WBC 5.5 02/05/2017   HGB 13.2 02/05/2017   HCT 41.0 02/05/2017   MCV 73.5 (L) 02/05/2017   PLT 138 (L) 02/05/2017   NEUTROABS 1.9 12/20/2016     CMP      Component Value Date/Time   NA 137 02/05/2017 0450   K 3.5 02/05/2017 0450   CL 103 02/05/2017 0450   CO2 26 02/05/2017 0450   GLUCOSE 133 (H) 02/05/2017 0450   BUN 6 02/05/2017 0450   CREATININE 0.99 02/05/2017 0450   CALCIUM 9.0 02/05/2017 0450   PROT 6.5 12/20/2016 1343   ALBUMIN 3.8 12/20/2016 1343   AST 44 (H) 12/20/2016 1343   ALT 59 12/20/2016 1343   ALKPHOS 78 12/20/2016 1343   BILITOT 0.7 12/20/2016 1343   GFRNONAA >60 02/05/2017 0450   GFRAA >60 02/05/2017 0450   12/20/2016: CEA-3.6   Imaging:  CT images from 12/29/2016-reviewed   Assessment/Plan:   1. Colon cancer, sigmoid, stage IIIa (T1,N1), status post a partial colectomy 02/02/2017  1/26 lymph nodes positive  MSI-stable, no loss of mismatch repair protein expression  Staging CTs 12/29/2016-small indeterminate left lung nodules  Normal preoperative CEA  2. Indeterminate left lung nodules on a chest CT 12/29/2016  3.   Chronic red cell microcytosis  4.   Diabetes  5.   Hypertension  6.   Family history of colon cancer  7.   Multiple polyps noted on the colonoscopy 12/19/2016   Disposition:   Mr. Calvert is been diagnosed with colon cancer. I reviewed the details of the surgical pathology report with Mr. Manganaro and his wife. He had a small tumor with an early T stage, but one lymph node contained metastatic carcinoma. We discussed the indication for adjuvant chemotherapy.  I described the benefit associated with adjuvant 5-fluorouracil based chemotherapy in patients with resected stage III colon cancer. We discussed the expected benefit with the addition of oxaliplatin. I explained recent data supporting the use of 3 months of  chemotherapy compared to the usual 6 months of therapy in patients with early stage III disease.  We discussed single agent capecitabine for 6 months versus a three-month course of CAPOX. He agrees to 3 months of CAPOX.  I reviewed the potential toxicities associated with this regimen including the chance for nausea/vomiting, mucositis, diarrhea, alopecia, and hematologic toxicity. We discussed the hyperpigmentation, rash, sun sensitivity, and hand/foot syndrome associated with capecitabine. We discussed the allergic reaction and various types of neuropathy seen with oxaliplatin. He agrees to proceed. He will attend a chemotherapy teaching class.  The plan is to proceed with a first cycle of CAPOX beginning 03/03/2017. We will try to administer the oxaliplatin with peripheral IV access since he is receiving only 4 treatments.  He does not appear to have hereditary non-polyposis colon cancer syndrome, but his family members are at increased risk of developing colon cancer. He understands they should undergo a screening colonoscopy.  50 minutes were spent with the patient today. The majority of the time was used for counseling and coordination of care. A chemotherapy plan  was entered.  Betsy Coder, MD  02/20/2017, 3:28 PM

## 2017-02-20 NOTE — Telephone Encounter (Signed)
Gave patient avs report and appointments for March and April.  °

## 2017-02-22 ENCOUNTER — Telehealth: Payer: Self-pay | Admitting: Pharmacist

## 2017-02-22 DIAGNOSIS — C189 Malignant neoplasm of colon, unspecified: Secondary | ICD-10-CM

## 2017-02-22 MED ORDER — CAPECITABINE 500 MG PO TABS: ORAL_TABLET | ORAL | 0 refills | Status: DC

## 2017-02-22 NOTE — Telephone Encounter (Signed)
Received communication from [facility] requires a specialty pharmacy.  Xeloda prescription escribed to [facility] location. Demographics, insurance information, labs and clinical information faxed to [facility] @ [phone]) [phone]. Fax confirmation received.   Will continue to follow,  Raul Del, PharmD, BCPS, [facility] [phone] 02/22/17 @ 2:10pm

## 2017-02-22 NOTE — Telephone Encounter (Signed)
Received new Xeloda prescription to be given in combination with Oxaliplatin as adjuvant therapy for colon cancer.  Patient to start 03/03/17. Prescription faxed to [facility].  Labs reviewed from 02/05/17 and 12/20/16 ([facility]) - ok for treatment  Current medication list in epic reviewed for drug/drug interactions: Xeloda/ Glyburide:  Category D - consider therapy modification.  Xeloda may decrease clearance of Glyburide and increased effects of Glyburide may be expected. (01/31/17 HgA1c = 8.8, recorded glucose blood sugars have been slightly elevated), notified MD to monitor Xeloda/Hyzaar: Category D - consider therapy modification.  Xeloda may decrease clearance of Hyzaar and increased effects of Hyzaar may be expected. Recorded BP's have been well controlled in the 130's/80's, notified MD, will monitor. Xeloda/Prilosec:  Category C - monitor.  Inconsistent reports and weak data regarding this interaction of possibly decreased Xeloda effectiveness. Patient takes Prilosec PRN. Will educate patient for alternative heartburn therapies. Xeloda/Calcium:  Category B - No action needed. Antacids may increase concentration of Xeloda. Xeloda/Albuterol: Category B -  No action needed.  Possible increased risk of QTc prolongation. Albuterol is used PRN.   Will continue to follow,  Raul Del, PharmD, BCPS, [facility] [phone]

## 2017-02-23 ENCOUNTER — Other Ambulatory Visit (HOSPITAL_BASED_OUTPATIENT_CLINIC_OR_DEPARTMENT_OTHER): Payer: 59

## 2017-02-23 ENCOUNTER — Encounter: Payer: Self-pay | Admitting: *Deleted

## 2017-02-23 ENCOUNTER — Other Ambulatory Visit: Payer: 59

## 2017-02-23 DIAGNOSIS — C187 Malignant neoplasm of sigmoid colon: Secondary | ICD-10-CM | POA: Diagnosis not present

## 2017-02-23 LAB — CBC WITH DIFFERENTIAL/PLATELET
BASO%: 0.7 % (ref 0.0–2.0)
BASOS ABS: 0 10*3/uL (ref 0.0–0.1)
EOS%: 5.2 % (ref 0.0–7.0)
Eosinophils Absolute: 0.3 10*3/uL (ref 0.0–0.5)
HCT: 41.7 % (ref 38.4–49.9)
HGB: 13.4 g/dL (ref 13.0–17.1)
LYMPH#: 2.4 10*3/uL (ref 0.9–3.3)
LYMPH%: 46.7 % (ref 14.0–49.0)
MCH: 24.2 pg — ABNORMAL LOW (ref 27.2–33.4)
MCHC: 32.1 g/dL (ref 32.0–36.0)
MCV: 75.5 fL — ABNORMAL LOW (ref 79.3–98.0)
MONO#: 0.7 10*3/uL (ref 0.1–0.9)
MONO%: 14.2 % — ABNORMAL HIGH (ref 0.0–14.0)
NEUT#: 1.7 10*3/uL (ref 1.5–6.5)
NEUT%: 33.2 % — AB (ref 39.0–75.0)
Platelets: 189 10*3/uL (ref 140–400)
RBC: 5.52 10*6/uL (ref 4.20–5.82)
RDW: 15 % — AB (ref 11.0–14.6)
WBC: 5.2 10*3/uL (ref 4.0–10.3)

## 2017-02-23 LAB — COMPREHENSIVE METABOLIC PANEL
ALBUMIN: 3.9 g/dL (ref 3.5–5.0)
ALK PHOS: 101 U/L (ref 40–150)
ALT: 77 U/L — ABNORMAL HIGH (ref 0–55)
AST: 49 U/L — AB (ref 5–34)
Anion Gap: 9 mEq/L (ref 3–11)
BILIRUBIN TOTAL: 0.45 mg/dL (ref 0.20–1.20)
BUN: 9.5 mg/dL (ref 7.0–26.0)
CO2: 27 meq/L (ref 22–29)
CREATININE: 1.1 mg/dL (ref 0.7–1.3)
Calcium: 9.4 mg/dL (ref 8.4–10.4)
Chloride: 101 mEq/L (ref 98–109)
EGFR: 82 mL/min/{1.73_m2} — ABNORMAL LOW (ref >90)
Glucose: 131 mg/dl (ref 70–140)
Potassium: 4.1 mEq/L (ref 3.5–5.1)
SODIUM: 137 meq/L (ref 136–145)
TOTAL PROTEIN: 7 g/dL (ref 6.4–8.3)

## 2017-02-26 ENCOUNTER — Other Ambulatory Visit: Payer: Self-pay | Admitting: Oncology

## 2017-02-26 DIAGNOSIS — C189 Malignant neoplasm of colon, unspecified: Secondary | ICD-10-CM

## 2017-02-27 ENCOUNTER — Other Ambulatory Visit: Payer: Self-pay | Admitting: *Deleted

## 2017-02-28 ENCOUNTER — Telehealth: Payer: Self-pay | Admitting: *Deleted

## 2017-02-28 NOTE — Telephone Encounter (Signed)
Message received from patient concerned that he has not received his Xeloda tablets which are scheduled to begin on 03/03/17.  Call placed to [facility] and spoke with [place].  Per Daine Floras, patient's prescription is on track to arrive by Thursday, 03/02/17.  Call placed back to patient to inform him of prescription information.  Phone number for [facility] given to patient. Patient appreciative of call back.

## 2017-03-01 ENCOUNTER — Encounter: Payer: Self-pay | Admitting: Pharmacist

## 2017-03-03 ENCOUNTER — Other Ambulatory Visit (HOSPITAL_BASED_OUTPATIENT_CLINIC_OR_DEPARTMENT_OTHER): Payer: 59

## 2017-03-03 ENCOUNTER — Ambulatory Visit (HOSPITAL_BASED_OUTPATIENT_CLINIC_OR_DEPARTMENT_OTHER): Payer: 59

## 2017-03-03 ENCOUNTER — Other Ambulatory Visit: Payer: Self-pay | Admitting: *Deleted

## 2017-03-03 VITALS — BP 135/84 | HR 84 | Temp 98.9°F | Resp 16

## 2017-03-03 DIAGNOSIS — C189 Malignant neoplasm of colon, unspecified: Secondary | ICD-10-CM | POA: Diagnosis not present

## 2017-03-03 DIAGNOSIS — Z5111 Encounter for antineoplastic chemotherapy: Secondary | ICD-10-CM

## 2017-03-03 DIAGNOSIS — C187 Malignant neoplasm of sigmoid colon: Secondary | ICD-10-CM | POA: Diagnosis not present

## 2017-03-03 LAB — COMPREHENSIVE METABOLIC PANEL
ALT: 65 U/L — AB (ref 0–55)
ANION GAP: 9 meq/L (ref 3–11)
AST: 45 U/L — ABNORMAL HIGH (ref 5–34)
Albumin: 4.1 g/dL (ref 3.5–5.0)
Alkaline Phosphatase: 105 U/L (ref 40–150)
BILIRUBIN TOTAL: 0.39 mg/dL (ref 0.20–1.20)
BUN: 9.4 mg/dL (ref 7.0–26.0)
CALCIUM: 9.6 mg/dL (ref 8.4–10.4)
CHLORIDE: 105 meq/L (ref 98–109)
CO2: 23 mEq/L (ref 22–29)
CREATININE: 1 mg/dL (ref 0.7–1.3)
EGFR: 89 mL/min/{1.73_m2} — ABNORMAL LOW (ref >90)
Glucose: 174 mg/dl — ABNORMAL HIGH (ref 70–140)
Potassium: 4.1 mEq/L (ref 3.5–5.1)
Sodium: 137 mEq/L (ref 136–145)
Total Protein: 7.5 g/dL (ref 6.4–8.3)

## 2017-03-03 MED ORDER — PROCHLORPERAZINE MALEATE 10 MG PO TABS: 10.0000 mg | ORAL_TABLET | Freq: Four times a day (QID) | ORAL | 3 refills | Status: DC | PRN

## 2017-03-03 MED ORDER — PALONOSETRON HCL INJECTION 0.25 MG/5ML
0.2500 mg | Freq: Once | INTRAVENOUS | Status: AC
  Administered 2017-03-03: 0.25 mg via INTRAVENOUS

## 2017-03-03 MED ORDER — DEXAMETHASONE SODIUM PHOSPHATE 10 MG/ML IJ SOLN
INTRAMUSCULAR | Status: AC
  Filled 2017-03-03: qty 1

## 2017-03-03 MED ORDER — DEXTROSE 5 % IV SOLN
Freq: Once | INTRAVENOUS | Status: AC
  Administered 2017-03-03: 11:00:00 via INTRAVENOUS

## 2017-03-03 MED ORDER — DEXAMETHASONE SODIUM PHOSPHATE 10 MG/ML IJ SOLN
10.0000 mg | Freq: Once | INTRAMUSCULAR | Status: AC
  Administered 2017-03-03: 10 mg via INTRAVENOUS

## 2017-03-03 MED ORDER — PALONOSETRON HCL INJECTION 0.25 MG/5ML
INTRAVENOUS | Status: AC
  Filled 2017-03-03: qty 5

## 2017-03-03 MED ORDER — OXALIPLATIN CHEMO INJECTION 100 MG/20ML
126.0000 mg/m2 | Freq: Once | INTRAVENOUS | Status: AC
  Administered 2017-03-03: 300 mg via INTRAVENOUS
  Filled 2017-03-03: qty 60

## 2017-03-03 NOTE — Telephone Encounter (Signed)
Rx for compazine sent to [facility] per pt request

## 2017-03-03 NOTE — Patient Instructions (Addendum)
[facility] Discharge Instructions for Patients Receiving Chemotherapy  Today you received the following chemotherapy agents Oxaliplatin   To help prevent nausea and vomiting after your treatment, we encourage you to take your nausea medication as directed.    If you develop nausea and vomiting that is not controlled by your nausea medication, call the clinic.   BELOW ARE SYMPTOMS THAT SHOULD BE REPORTED IMMEDIATELY:  *FEVER GREATER THAN 100.5 F  *CHILLS WITH OR WITHOUT FEVER  NAUSEA AND VOMITING THAT IS NOT CONTROLLED WITH YOUR NAUSEA MEDICATION  *UNUSUAL SHORTNESS OF BREATH  *UNUSUAL BRUISING OR BLEEDING  TENDERNESS IN MOUTH AND THROAT WITH OR WITHOUT PRESENCE OF ULCERS  *URINARY PROBLEMS  *BOWEL PROBLEMS  UNUSUAL RASH Items with * indicate a potential emergency and should be followed up as soon as possible.  Feel free to call the clinic you have any questions or concerns. The clinic phone number is [phone].  Please show the [facility] at check-in to the Emergency Department and triage nurse.  Oxaliplatin Injection What is this medicine? OXALIPLATIN (ox AL i PLA tin) is a chemotherapy drug. It targets fast dividing cells, like cancer cells, and causes these cells to die. This medicine is used to treat cancers of the colon and rectum, and many other cancers. This medicine may be used for other purposes; ask your health care provider or pharmacist if you have questions. COMMON BRAND NAME(S): Eloxatin What should I tell my health care provider before I take this medicine? They need to know if you have any of these conditions: -kidney disease -an unusual or allergic reaction to oxaliplatin, other chemotherapy, other medicines, foods, dyes, or preservatives -pregnant or trying to get pregnant -breast-feeding How should I use this medicine? This drug is given as an infusion into a vein. It is administered in a hospital or clinic by a specially  trained health care professional. Talk to your pediatrician regarding the use of this medicine in children. Special care may be needed. Overdosage: If you think you have taken too much of this medicine contact a poison control center or emergency room at once. NOTE: This medicine is only for you. Do not share this medicine with others. What if I miss a dose? It is important not to miss a dose. Call your doctor or health care professional if you are unable to keep an appointment. What may interact with this medicine? -medicines to increase blood counts like filgrastim, pegfilgrastim, sargramostim -probenecid -some antibiotics like amikacin, gentamicin, neomycin, polymyxin B, streptomycin, tobramycin -zalcitabine Talk to your doctor or health care professional before taking any of these medicines: -acetaminophen -aspirin -ibuprofen -ketoprofen -naproxen This list may not describe all possible interactions. Give your health care provider a list of all the medicines, herbs, non-prescription drugs, or dietary supplements you use. Also tell them if you smoke, drink alcohol, or use illegal drugs. Some items may interact with your medicine. What should I watch for while using this medicine? Your condition will be monitored carefully while you are receiving this medicine. You will need important blood work done while you are taking this medicine. This medicine can make you more sensitive to cold. Do not drink cold drinks or use ice. Cover exposed skin before coming in contact with cold temperatures or cold objects. When out in cold weather wear warm clothing and cover your mouth and nose to warm the air that goes into your lungs. Tell your doctor if you get sensitive to the cold. This drug  may make you feel generally unwell. This is not uncommon, as chemotherapy can affect healthy cells as well as cancer cells. Report any side effects. Continue your course of treatment even though you feel ill unless  your doctor tells you to stop. In some cases, you may be given additional medicines to help with side effects. Follow all directions for their use. Call your doctor or health care professional for advice if you get a fever, chills or sore throat, or other symptoms of a cold or flu. Do not treat yourself. This drug decreases your body's ability to fight infections. Try to avoid being around people who are sick. This medicine may increase your risk to bruise or bleed. Call your doctor or health care professional if you notice any unusual bleeding. Be careful brushing and flossing your teeth or using a toothpick because you may get an infection or bleed more easily. If you have any dental work done, tell your dentist you are receiving this medicine. Avoid taking products that contain aspirin, acetaminophen, ibuprofen, naproxen, or ketoprofen unless instructed by your doctor. These medicines may hide a fever. Do not become pregnant while taking this medicine. Women should inform their doctor if they wish to become pregnant or think they might be pregnant. There is a potential for serious side effects to an unborn child. Talk to your health care professional or pharmacist for more information. Do not breast-feed an infant while taking this medicine. Call your doctor or health care professional if you get diarrhea. Do not treat yourself. What side effects may I notice from receiving this medicine? Side effects that you should report to your doctor or health care professional as soon as possible: -allergic reactions like skin rash, itching or hives, swelling of the face, lips, or tongue -low blood counts - This drug may decrease the number of white blood cells, red blood cells and platelets. You may be at increased risk for infections and bleeding. -signs of infection - fever or chills, cough, sore throat, pain or difficulty passing urine -signs of decreased platelets or bleeding - bruising, pinpoint red spots  on the skin, black, tarry stools, nosebleeds -signs of decreased red blood cells - unusually weak or tired, fainting spells, lightheadedness -breathing problems -chest pain, pressure -cough -diarrhea -jaw tightness -mouth sores -nausea and vomiting -pain, swelling, redness or irritation at the injection site -pain, tingling, numbness in the hands or feet -problems with balance, talking, walking -redness, blistering, peeling or loosening of the skin, including inside the mouth -trouble passing urine or change in the amount of urine Side effects that usually do not require medical attention (report to your doctor or health care professional if they continue or are bothersome): -changes in vision -constipation -hair loss -loss of appetite -metallic taste in the mouth or changes in taste -stomach pain This list may not describe all possible side effects. Call your doctor for medical advice about side effects. You may report side effects to FDA at 1-800-FDA-1088. Where should I keep my medicine? This drug is given in a hospital or clinic and will not be stored at home. NOTE: This sheet is a summary. It may not cover all possible information. If you have questions about this medicine, talk to your doctor, pharmacist, or health care provider.  2018 Elsevier/Gold Standard (2008-06-10 17:22:47)

## 2017-03-03 NOTE — Progress Notes (Signed)
Ok to treat with last [place], per MD Benay Spice.   Pt c/o allergy symptoms with sneezing and cough. MD Sherrill aware. Ok to proceed with treatment today.

## 2017-03-06 ENCOUNTER — Telehealth: Payer: Self-pay | Admitting: *Deleted

## 2017-03-06 NOTE — Telephone Encounter (Signed)
-----   Message from Veverly Fells, RN sent at 03/03/2017  2:31 PM EDT ----- Regarding: First-time Follow-up: MD Benay Spice Contact: [phone] Angel [name], a patient of MD Benay Spice, received oxaliplatin for the first time today. He tolerated his treatment well with no complaints.

## 2017-03-06 NOTE — Telephone Encounter (Signed)
Called pt, he reports he has had up to 4 loose stools while on Xeloda. Down to 2 stools/ 24 hrs with Imodium. Side effect management discussed. He has experienced cold sensitivity but he is managing OK when he remembers. Denied mouth sores. Discussed hand/foot syndrome.  Reviewed # to call for management of side effects. Pt voiced understanding.

## 2017-03-15 ENCOUNTER — Other Ambulatory Visit: Payer: Self-pay | Admitting: *Deleted

## 2017-03-15 DIAGNOSIS — C189 Malignant neoplasm of colon, unspecified: Secondary | ICD-10-CM

## 2017-03-15 MED ORDER — CAPECITABINE 500 MG PO TABS: ORAL_TABLET | ORAL | 0 refills | Status: DC

## 2017-03-19 ENCOUNTER — Other Ambulatory Visit: Payer: Self-pay | Admitting: Oncology

## 2017-03-24 ENCOUNTER — Other Ambulatory Visit (HOSPITAL_BASED_OUTPATIENT_CLINIC_OR_DEPARTMENT_OTHER): Payer: 59

## 2017-03-24 ENCOUNTER — Ambulatory Visit (HOSPITAL_BASED_OUTPATIENT_CLINIC_OR_DEPARTMENT_OTHER): Payer: 59 | Admitting: Oncology

## 2017-03-24 ENCOUNTER — Encounter (INDEPENDENT_AMBULATORY_CARE_PROVIDER_SITE_OTHER): Payer: Self-pay

## 2017-03-24 ENCOUNTER — Ambulatory Visit (HOSPITAL_BASED_OUTPATIENT_CLINIC_OR_DEPARTMENT_OTHER): Payer: 59

## 2017-03-24 ENCOUNTER — Telehealth: Payer: Self-pay | Admitting: Oncology

## 2017-03-24 VITALS — BP 144/77 | HR 69 | Temp 98.6°F | Resp 18 | Ht 69.0 in | Wt 258.0 lb

## 2017-03-24 DIAGNOSIS — C187 Malignant neoplasm of sigmoid colon: Secondary | ICD-10-CM | POA: Diagnosis not present

## 2017-03-24 DIAGNOSIS — Z5111 Encounter for antineoplastic chemotherapy: Secondary | ICD-10-CM

## 2017-03-24 DIAGNOSIS — C189 Malignant neoplasm of colon, unspecified: Secondary | ICD-10-CM

## 2017-03-24 LAB — CBC WITH DIFFERENTIAL/PLATELET
BASO%: 0.7 % (ref 0.0–2.0)
Basophils Absolute: 0 10*3/uL (ref 0.0–0.1)
EOS%: 4.9 % (ref 0.0–7.0)
Eosinophils Absolute: 0.2 10*3/uL (ref 0.0–0.5)
HCT: 42 % (ref 38.4–49.9)
HGB: 13.5 g/dL (ref 13.0–17.1)
LYMPH%: 43.2 % (ref 14.0–49.0)
MCH: 24.1 pg — ABNORMAL LOW (ref 27.2–33.4)
MCHC: 32.2 g/dL (ref 32.0–36.0)
MCV: 74.8 fL — ABNORMAL LOW (ref 79.3–98.0)
MONO#: 1 10*3/uL — ABNORMAL HIGH (ref 0.1–0.9)
MONO%: 19.5 % — ABNORMAL HIGH (ref 0.0–14.0)
NEUT#: 1.6 10*3/uL (ref 1.5–6.5)
NEUT%: 31.7 % — AB (ref 39.0–75.0)
PLATELETS: 173 10*3/uL (ref 140–400)
RBC: 5.62 10*6/uL (ref 4.20–5.82)
RDW: 14.4 % (ref 11.0–14.6)
WBC: 5 10*3/uL (ref 4.0–10.3)
lymph#: 2.1 10*3/uL (ref 0.9–3.3)

## 2017-03-24 LAB — COMPREHENSIVE METABOLIC PANEL
ALT: 108 U/L — AB (ref 0–55)
ANION GAP: 10 meq/L (ref 3–11)
AST: 75 U/L — ABNORMAL HIGH (ref 5–34)
Albumin: 4.1 g/dL (ref 3.5–5.0)
Alkaline Phosphatase: 97 U/L (ref 40–150)
BUN: 15.3 mg/dL (ref 7.0–26.0)
CHLORIDE: 105 meq/L (ref 98–109)
CO2: 22 meq/L (ref 22–29)
Calcium: 9.3 mg/dL (ref 8.4–10.4)
Creatinine: 1 mg/dL (ref 0.7–1.3)
EGFR: 88 mL/min/{1.73_m2} — ABNORMAL LOW (ref >90)
Glucose: 158 mg/dl — ABNORMAL HIGH (ref 70–140)
Potassium: 4.3 mEq/L (ref 3.5–5.1)
Sodium: 138 mEq/L (ref 136–145)
Total Bilirubin: 0.45 mg/dL (ref 0.20–1.20)
Total Protein: 7.2 g/dL (ref 6.4–8.3)

## 2017-03-24 MED ORDER — DEXTROSE 5 % IV SOLN
Freq: Once | INTRAVENOUS | Status: AC
  Administered 2017-03-24: 12:00:00 via INTRAVENOUS

## 2017-03-24 MED ORDER — OXALIPLATIN CHEMO INJECTION 100 MG/20ML
126.0000 mg/m2 | Freq: Once | INTRAVENOUS | Status: AC
  Administered 2017-03-24: 300 mg via INTRAVENOUS
  Filled 2017-03-24: qty 60

## 2017-03-24 MED ORDER — DEXAMETHASONE SODIUM PHOSPHATE 10 MG/ML IJ SOLN
INTRAMUSCULAR | Status: AC
  Filled 2017-03-24: qty 1

## 2017-03-24 MED ORDER — PALONOSETRON HCL INJECTION 0.25 MG/5ML
INTRAVENOUS | Status: AC
  Filled 2017-03-24: qty 5

## 2017-03-24 MED ORDER — PALONOSETRON HCL INJECTION 0.25 MG/5ML
0.2500 mg | Freq: Once | INTRAVENOUS | Status: AC
Start: 2017-03-24 — End: 2017-03-24
  Administered 2017-03-24: 0.25 mg via INTRAVENOUS

## 2017-03-24 MED ORDER — DEXAMETHASONE SODIUM PHOSPHATE 10 MG/ML IJ SOLN
10.0000 mg | Freq: Once | INTRAMUSCULAR | Status: AC
  Administered 2017-03-24: 10 mg via INTRAVENOUS

## 2017-03-24 NOTE — Telephone Encounter (Signed)
Gave patient AVS and calender per 4/27 los.  

## 2017-03-24 NOTE — Patient Instructions (Signed)
[facility] Discharge Instructions for Patients Receiving Chemotherapy  Today you received the following chemotherapy agents Oxaliplatin  To help prevent nausea and vomiting after your treatment, we encourage you to take your nausea medication as directed.   If you develop nausea and vomiting that is not controlled by your nausea medication, call the clinic.   BELOW ARE SYMPTOMS THAT SHOULD BE REPORTED IMMEDIATELY:  *FEVER GREATER THAN 100.5 F  *CHILLS WITH OR WITHOUT FEVER  NAUSEA AND VOMITING THAT IS NOT CONTROLLED WITH YOUR NAUSEA MEDICATION  *UNUSUAL SHORTNESS OF BREATH  *UNUSUAL BRUISING OR BLEEDING  TENDERNESS IN MOUTH AND THROAT WITH OR WITHOUT PRESENCE OF ULCERS  *URINARY PROBLEMS  *BOWEL PROBLEMS  UNUSUAL RASH Items with * indicate a potential emergency and should be followed up as soon as possible.  Feel free to call the clinic you have any questions or concerns. The clinic phone number is [phone].  Please show the CHEMO ALERT CARD at check-in to the Emergency Department and triage nurse.    

## 2017-03-24 NOTE — Progress Notes (Signed)
  [facility] OFFICE PROGRESS NOTE   Diagnosis: Colon cancer  INTERVAL HISTORY:   Mr. Fallert returns as scheduled. He completed a first cycle of CAPOX beginning 03/03/2017. Cold sensitivity lasted 5 days following chemotherapy. He had a few episodes of diarrhea, relieved with Imodium. No mouth sores or hand/foot pain. He has mild discomfort at the surgical site with swelling.  Objective:  Vital signs in last 24 hours:  Blood pressure (!) 144/77, pulse 69, temperature 98.6 F (37 C), temperature source Oral, resp. rate 18, height '5\' 9"'$  (1.753 m), weight 258 lb (117 kg), SpO2 100 %.    HEENT: No thrush or ulcers Resp: Lungs clear bilaterally Cardio: Regular rate and rhythm GI: No hepatosplenomegaly, soft, nontender, the midline incision has healed. There appears to be an incisional hernia. Vascular: No leg edema  Skin: Mild hyperpigmentation of the hands, soles without erythema or skin breakdown    Lab Results:  Lab Results  Component Value Date   WBC 5.0 03/24/2017   HGB 13.5 03/24/2017   HCT 42.0 03/24/2017   MCV 74.8 (L) 03/24/2017   PLT 173 03/24/2017   NEUTROABS 1.6 03/24/2017     Medications: I have reviewed the patient's current medications.  Assessment/Plan: 1. Colon cancer, sigmoid, stage IIIa (T1,N1), status post a partial colectomy 02/02/2017 ? 1/26 lymph nodes positive ? MSI-stable, no loss of mismatch repair protein expression ? Staging CTs 12/29/2016-small indeterminate left lung nodules ? Normal preoperative CEA ? Cycle 1 CAPOX 03/03/2017 ? Cycle 2 CAPOX 03/24/2017  2. Indeterminate left lung nodules on a chest CT 12/29/2016  3.   Chronic red cell microcytosis  4.   Diabetes  5.   Hypertension  6.   Family history of colon cancer  7.   Multiple polyps noted on the colonoscopy 12/19/2016    Disposition:  Mr. Benedick tolerated the first cycle of chemotherapy well. He will complete cycle 2 chemotherapy beginning  today. He will contact us for hand/foot pain or new symptoms following this cycle. He appears to have an incisional hernia. He plans to schedule an appointment with Dr. Marcello Moores.  Mr. Noyce will return for an office visit and chemotherapy in 3 weeks.  15 minutes were spent with the patient today. The majority of the time was used for counseling and coordination of care.  Betsy Coder, MD  03/24/2017  10:18 AM

## 2017-04-05 ENCOUNTER — Other Ambulatory Visit: Payer: Self-pay | Admitting: *Deleted

## 2017-04-05 DIAGNOSIS — C189 Malignant neoplasm of colon, unspecified: Secondary | ICD-10-CM

## 2017-04-05 MED ORDER — CAPECITABINE 500 MG PO TABS: ORAL_TABLET | ORAL | 0 refills | Status: DC

## 2017-04-06 ENCOUNTER — Other Ambulatory Visit (HOSPITAL_COMMUNITY): Payer: Self-pay | Admitting: Internal Medicine

## 2017-04-06 ENCOUNTER — Other Ambulatory Visit: Payer: Self-pay | Admitting: Internal Medicine

## 2017-04-06 ENCOUNTER — Ambulatory Visit
Admission: RE | Admit: 2017-04-06 | Discharge: 2017-04-06 | Disposition: A | Discharge status: Home / Self Care | Payer: 59 | Source: Ambulatory Visit | Attending: Internal Medicine | Admitting: Internal Medicine

## 2017-04-06 DIAGNOSIS — R109 Unspecified abdominal pain: Secondary | ICD-10-CM

## 2017-04-06 DIAGNOSIS — R52 Pain, unspecified: Secondary | ICD-10-CM

## 2017-04-07 ENCOUNTER — Ambulatory Visit (HOSPITAL_COMMUNITY)
Admission: RE | Admit: 2017-04-07 | Discharge: 2017-04-07 | Disposition: A | Discharge status: Home / Self Care | Payer: 59 | Source: Ambulatory Visit | Attending: Internal Medicine | Admitting: Internal Medicine

## 2017-04-07 DIAGNOSIS — M79602 Pain in left arm: Secondary | ICD-10-CM | POA: Diagnosis present

## 2017-04-07 DIAGNOSIS — R52 Pain, unspecified: Secondary | ICD-10-CM

## 2017-04-07 NOTE — Progress Notes (Signed)
*  Preliminary Results* Left upper extremity venous duplex completed. Left upper extremity is negative for deep and superficial vein thrombosis.  04/07/2017 1:43 PM  Maudry Mayhew, BS, RVT, RDCS, RDMS

## 2017-04-13 ENCOUNTER — Other Ambulatory Visit: Payer: Self-pay | Admitting: Internal Medicine

## 2017-04-13 ENCOUNTER — Ambulatory Visit
Admission: RE | Admit: 2017-04-13 | Discharge: 2017-04-13 | Disposition: A | Discharge status: Home / Self Care | Payer: 59 | Source: Ambulatory Visit | Attending: Internal Medicine | Admitting: Internal Medicine

## 2017-04-13 DIAGNOSIS — M79602 Pain in left arm: Secondary | ICD-10-CM

## 2017-04-14 ENCOUNTER — Telehealth: Payer: Self-pay | Admitting: Oncology

## 2017-04-14 ENCOUNTER — Ambulatory Visit (HOSPITAL_BASED_OUTPATIENT_CLINIC_OR_DEPARTMENT_OTHER): Payer: 59

## 2017-04-14 ENCOUNTER — Ambulatory Visit (HOSPITAL_BASED_OUTPATIENT_CLINIC_OR_DEPARTMENT_OTHER): Payer: 59 | Admitting: Oncology

## 2017-04-14 ENCOUNTER — Other Ambulatory Visit (HOSPITAL_BASED_OUTPATIENT_CLINIC_OR_DEPARTMENT_OTHER): Payer: 59

## 2017-04-14 VITALS — BP 138/88 | HR 83 | Temp 97.8°F | Resp 18 | Ht 69.0 in | Wt 256.1 lb

## 2017-04-14 DIAGNOSIS — Z5111 Encounter for antineoplastic chemotherapy: Secondary | ICD-10-CM

## 2017-04-14 DIAGNOSIS — C187 Malignant neoplasm of sigmoid colon: Secondary | ICD-10-CM | POA: Diagnosis not present

## 2017-04-14 DIAGNOSIS — C189 Malignant neoplasm of colon, unspecified: Secondary | ICD-10-CM

## 2017-04-14 LAB — COMPREHENSIVE METABOLIC PANEL
ALBUMIN: 4.2 g/dL (ref 3.5–5.0)
ALK PHOS: 81 U/L (ref 40–150)
ALT: 73 U/L — AB (ref 0–55)
AST: 57 U/L — AB (ref 5–34)
Anion Gap: 11 mEq/L (ref 3–11)
BUN: 11.6 mg/dL (ref 7.0–26.0)
CO2: 24 mEq/L (ref 22–29)
Calcium: 9.8 mg/dL (ref 8.4–10.4)
Chloride: 104 mEq/L (ref 98–109)
Creatinine: 1.1 mg/dL (ref 0.7–1.3)
EGFR: 85 mL/min/{1.73_m2} — ABNORMAL LOW (ref >90)
GLUCOSE: 123 mg/dL (ref 70–140)
POTASSIUM: 3.9 meq/L (ref 3.5–5.1)
SODIUM: 138 meq/L (ref 136–145)
TOTAL PROTEIN: 7.5 g/dL (ref 6.4–8.3)
Total Bilirubin: 0.77 mg/dL (ref 0.20–1.20)

## 2017-04-14 LAB — CBC WITH DIFFERENTIAL/PLATELET
BASO%: 0.4 % (ref 0.0–2.0)
Basophils Absolute: 0 10*3/uL (ref 0.0–0.1)
EOS%: 2.9 % (ref 0.0–7.0)
Eosinophils Absolute: 0.2 10*3/uL (ref 0.0–0.5)
HCT: 40.3 % (ref 38.4–49.9)
HEMOGLOBIN: 13 g/dL (ref 13.0–17.1)
LYMPH%: 50.7 % — ABNORMAL HIGH (ref 14.0–49.0)
MCH: 24.6 pg — AB (ref 27.2–33.4)
MCHC: 32.3 g/dL (ref 32.0–36.0)
MCV: 76.3 fL — ABNORMAL LOW (ref 79.3–98.0)
MONO#: 0.7 10*3/uL (ref 0.1–0.9)
MONO%: 13.6 % (ref 0.0–14.0)
NEUT%: 32.4 % — ABNORMAL LOW (ref 39.0–75.0)
NEUTROS ABS: 1.7 10*3/uL (ref 1.5–6.5)
Platelets: 172 10*3/uL (ref 140–400)
RBC: 5.28 10*6/uL (ref 4.20–5.82)
RDW: 18.5 % — AB (ref 11.0–14.6)
WBC: 5.2 10*3/uL (ref 4.0–10.3)
lymph#: 2.7 10*3/uL (ref 0.9–3.3)

## 2017-04-14 MED ORDER — DEXAMETHASONE SODIUM PHOSPHATE 10 MG/ML IJ SOLN
10.0000 mg | Freq: Once | INTRAMUSCULAR | Status: AC
  Administered 2017-04-14: 10 mg via INTRAVENOUS

## 2017-04-14 MED ORDER — DEXAMETHASONE SODIUM PHOSPHATE 10 MG/ML IJ SOLN
INTRAMUSCULAR | Status: AC
  Filled 2017-04-14: qty 1

## 2017-04-14 MED ORDER — DEXTROSE 5 % IV SOLN
Freq: Once | INTRAVENOUS | Status: AC
  Administered 2017-04-14: 11:00:00 via INTRAVENOUS

## 2017-04-14 MED ORDER — PALONOSETRON HCL INJECTION 0.25 MG/5ML
0.2500 mg | Freq: Once | INTRAVENOUS | Status: AC
  Administered 2017-04-14: 0.25 mg via INTRAVENOUS

## 2017-04-14 MED ORDER — OXALIPLATIN CHEMO INJECTION 100 MG/20ML
126.0000 mg/m2 | Freq: Once | INTRAVENOUS | Status: AC
  Administered 2017-04-14: 300 mg via INTRAVENOUS
  Filled 2017-04-14: qty 60

## 2017-04-14 MED ORDER — PALONOSETRON HCL INJECTION 0.25 MG/5ML
INTRAVENOUS | Status: AC
  Filled 2017-04-14: qty 5

## 2017-04-14 NOTE — Telephone Encounter (Signed)
Gave patient AVS and calender per 5/18 los. Unable to schedule MD appt due to block will call and confirm with patient once appt is made.

## 2017-04-14 NOTE — Progress Notes (Signed)
[facility] OFFICE PROGRESS NOTE   Diagnosis: Colon cancer  INTERVAL HISTORY:   Mr. Shorey returns as scheduled. He completed another cycle of CAPOX beginning 03/24/2017. He reports developing "sore tonsils ". He was treated with antibiotics by Dr. Delfina Redwood. No mouth sores. No diarrhea. He had cold sensitivity lasting 5-6 days following chemotherapy. This has resolved. No other neuropathy symptoms. He has developed darkening of the skin of the hands. He complains of pain in the left shoulder. He is evaluated by Dr. Delfina Redwood. He has been referred to orthopedics.   Objective:  Vital signs in last 24 hours:  Blood pressure 138/88, pulse 83, temperature 97.8 F (36.6 C), temperature source Oral, resp. rate 18, height '5\' 9"'  (1.753 m), weight 256 lb 1.6 oz (116.2 kg), SpO2 100 %.    HEENT: No thrush or ulcers Resp: Lungs clear bilaterally Cardio: Regular rate and rhythm GI: No hepatosplenomegaly, reducible incisional hernia Vascular: No leg edema, the left lower leg is slightly larger than the right side. Neuro: Mild loss of vibratory sense at the fingertips bilaterally  Skin: Hyperpigmentation of the palms greater than the soles. No erythema or skin breakdown.  Musculoskeletal: Discomfort with motion at the left shoulder.    Lab Results:  Lab Results  Component Value Date   WBC 5.2 04/14/2017   HGB 13.0 04/14/2017   HCT 40.3 04/14/2017   MCV 76.3 (L) 04/14/2017   PLT 172 04/14/2017   NEUTROABS 1.7 04/14/2017    CMP     Component Value Date/Time   NA 138 04/14/2017 0935   K 3.9 04/14/2017 0935   CL 103 02/05/2017 0450   CO2 24 04/14/2017 0935   GLUCOSE 123 04/14/2017 0935   BUN 11.6 04/14/2017 0935   CREATININE 1.1 04/14/2017 0935   CALCIUM 9.8 04/14/2017 0935   PROT 7.5 04/14/2017 0935   ALBUMIN 4.2 04/14/2017 0935   AST 57 (H) 04/14/2017 0935   ALT 73 (H) 04/14/2017 0935   ALKPHOS 81 04/14/2017 0935   BILITOT 0.77 04/14/2017 0935   GFRNONAA >60  02/05/2017 0450   GFRAA >60 02/05/2017 0450     Imaging:  Dg Shoulder Left  Result Date: 04/13/2017 CLINICAL DATA:  Left shoulder pain for several days EXAM: LEFT SHOULDER - 2+ VIEW COMPARISON:  CT 12/29/2016 FINDINGS: No fracture or malalignment. Moderate AC joint degenerative change with prominent inferior bony spurring. Mild calcific tendinitis at the rotator cuff insertion. Left lung apex clear. IMPRESSION: 1. No acute osseous abnormality. Moderate AC joint degenerative change 2. Mild calcific tendinitis Electronically Signed   By: Donavan Foil M.D.   On: 04/13/2017 15:09    Medications: I have reviewed the patient's current medications.  Assessment/Plan: 1. Colon cancer, sigmoid, stage IIIa (T1,N1), status post a partial colectomy 02/02/2017 ? 1/26 lymph nodes positive ? MSI-stable, no loss of mismatch repair protein expression ? Staging CTs 12/29/2016-small indeterminate left lung nodules ? Normal preoperative CEA ? Cycle 1 CAPOX 03/03/2017 ? Cycle 2 CAPOX 03/24/2017 ? Cycle 3 CAPOX 04/14/2017  2. Indeterminate left lung nodules on a chest CT 12/29/2016  3. Chronic red cell microcytosis  4. Diabetes  5. Hypertension  6. Family history of colon cancer  7. Multiple polyps noted on the colonoscopy 12/19/2016     Disposition:  Mr. Greth has tolerated the chemotherapy well today. He will complete cycle 3 CAPOX beginning today. I suspect the left shoulder discomfort is related to a benign musculoskeletal condition. He is scheduled see orthopedics next week.  Mr.  Scoggins will return for an office visit and the final cycle of CAPOX in 3 weeks.  15 minutes were spent with the patient today. The majority of the time was used for counseling and coordination of care.  Betsy Coder, MD  04/14/2017  10:30 AM

## 2017-04-14 NOTE — Patient Instructions (Signed)
[facility] Discharge Instructions for Patients Receiving Chemotherapy  Today you received the following chemotherapy agents Oxaliplatin   To help prevent nausea and vomiting after your treatment, we encourage you to take your nausea medication as directed. NO ZOFRAN FOR 3 DAYS.   If you develop nausea and vomiting that is not controlled by your nausea medication, call the clinic.   BELOW ARE SYMPTOMS THAT SHOULD BE REPORTED IMMEDIATELY:  *FEVER GREATER THAN 100.5 F  *CHILLS WITH OR WITHOUT FEVER  NAUSEA AND VOMITING THAT IS NOT CONTROLLED WITH YOUR NAUSEA MEDICATION  *UNUSUAL SHORTNESS OF BREATH  *UNUSUAL BRUISING OR BLEEDING  TENDERNESS IN MOUTH AND THROAT WITH OR WITHOUT PRESENCE OF ULCERS  *URINARY PROBLEMS  *BOWEL PROBLEMS  UNUSUAL RASH Items with * indicate a potential emergency and should be followed up as soon as possible.  Feel free to call the clinic you have any questions or concerns. The clinic phone number is [phone].  Please show the [facility] at check-in to the Emergency Department and triage nurse.

## 2017-04-25 ENCOUNTER — Other Ambulatory Visit: Payer: Self-pay | Admitting: *Deleted

## 2017-04-25 DIAGNOSIS — C189 Malignant neoplasm of colon, unspecified: Secondary | ICD-10-CM

## 2017-04-25 MED ORDER — CAPECITABINE 500 MG PO TABS: ORAL_TABLET | ORAL | 0 refills | Status: DC

## 2017-04-29 NOTE — Addendum Note (Signed)
Addendum  created 04/29/17 0827 by Tsutomu Barfoot, MD   Sign clinical note    

## 2017-04-30 ENCOUNTER — Other Ambulatory Visit: Payer: Self-pay | Admitting: Oncology

## 2017-05-05 ENCOUNTER — Ambulatory Visit: Payer: 59

## 2017-05-05 ENCOUNTER — Telehealth: Payer: Self-pay | Admitting: Oncology

## 2017-05-05 ENCOUNTER — Ambulatory Visit (HOSPITAL_BASED_OUTPATIENT_CLINIC_OR_DEPARTMENT_OTHER): Payer: 59 | Admitting: Oncology

## 2017-05-05 ENCOUNTER — Other Ambulatory Visit (HOSPITAL_BASED_OUTPATIENT_CLINIC_OR_DEPARTMENT_OTHER): Payer: 59

## 2017-05-05 ENCOUNTER — Other Ambulatory Visit: Payer: Self-pay | Admitting: *Deleted

## 2017-05-05 ENCOUNTER — Telehealth: Payer: Self-pay | Admitting: *Deleted

## 2017-05-05 VITALS — BP 132/77 | HR 87 | Temp 98.0°F | Resp 18 | Ht 69.0 in | Wt 249.3 lb

## 2017-05-05 DIAGNOSIS — C189 Malignant neoplasm of colon, unspecified: Secondary | ICD-10-CM

## 2017-05-05 DIAGNOSIS — C187 Malignant neoplasm of sigmoid colon: Secondary | ICD-10-CM

## 2017-05-05 DIAGNOSIS — D701 Agranulocytosis secondary to cancer chemotherapy: Secondary | ICD-10-CM

## 2017-05-05 LAB — COMPREHENSIVE METABOLIC PANEL
ALBUMIN: 4.1 g/dL (ref 3.5–5.0)
ALK PHOS: 87 U/L (ref 40–150)
ALT: 76 U/L — ABNORMAL HIGH (ref 0–55)
ANION GAP: 13 meq/L — AB (ref 3–11)
AST: 75 U/L — AB (ref 5–34)
BUN: 7.6 mg/dL (ref 7.0–26.0)
CALCIUM: 10.3 mg/dL (ref 8.4–10.4)
CHLORIDE: 101 meq/L (ref 98–109)
CO2: 27 mEq/L (ref 22–29)
CREATININE: 1.1 mg/dL (ref 0.7–1.3)
EGFR: 81 mL/min/{1.73_m2} — ABNORMAL LOW (ref >90)
Glucose: 118 mg/dl (ref 70–140)
POTASSIUM: 3.8 meq/L (ref 3.5–5.1)
Sodium: 140 mEq/L (ref 136–145)
Total Bilirubin: 0.89 mg/dL (ref 0.20–1.20)
Total Protein: 7.6 g/dL (ref 6.4–8.3)

## 2017-05-05 LAB — CBC WITH DIFFERENTIAL/PLATELET
BASO%: 0.5 % (ref 0.0–2.0)
Basophils Absolute: 0 10*3/uL (ref 0.0–0.1)
EOS%: 3.6 % (ref 0.0–7.0)
Eosinophils Absolute: 0.2 10*3/uL (ref 0.0–0.5)
HCT: 41.9 % (ref 38.4–49.9)
HGB: 13.5 g/dL (ref 13.0–17.1)
LYMPH%: 57.3 % — AB (ref 14.0–49.0)
MCH: 25.2 pg — ABNORMAL LOW (ref 27.2–33.4)
MCHC: 32.2 g/dL (ref 32.0–36.0)
MCV: 78.2 fL — ABNORMAL LOW (ref 79.3–98.0)
MONO#: 0.5 10*3/uL (ref 0.1–0.9)
MONO%: 11.8 % (ref 0.0–14.0)
NEUT%: 26.8 % — ABNORMAL LOW (ref 39.0–75.0)
NEUTROS ABS: 1.1 10*3/uL — AB (ref 1.5–6.5)
Platelets: 166 10*3/uL (ref 140–400)
RBC: 5.36 10*6/uL (ref 4.20–5.82)
RDW: 20.4 % — ABNORMAL HIGH (ref 11.0–14.6)
WBC: 4.2 10*3/uL (ref 4.0–10.3)
lymph#: 2.4 10*3/uL (ref 0.9–3.3)

## 2017-05-05 NOTE — Progress Notes (Signed)
   [facility] OFFICE PROGRESS NOTE   Diagnosis: Colon cancer  INTERVAL HISTORY:   Angel [name] returns as scheduled. He completed another cycle of CAPOX beginning 04/14/2017. No nausea/vomiting, diarrhea, or hand/foot pain. He reports cold sensitivity lasting approximately 9 days following chemotherapy. No other neuropathy symptoms. He would like to have the incisional hernia repaired prior to taking a trip to [place] in September.  The left shoulder pain resolved after an injection treatment by orthopedics.  Objective:  Vital signs in last 24 hours:  Blood pressure 132/77, pulse 87, temperature 98 F (36.7 C), temperature source Oral, resp. rate 18, height '5\' 9"'$  (1.753 m), weight 249 lb 4.8 oz (113.1 kg), SpO2 100 %.    HEENT: Mild hyperpigmentation of the palate, no thrush or ulcers Resp: Lungs with end inspiratory rhonchi at the posterior basis, no respiratory distress Cardio: Regular rate and rhythm GI: No hepatosplenomegaly, reducible ventral hernia Vascular: No leg edema Neuro: The vibratory sense is intact at the fingertips bilaterally  Skin: Hyperpigmentation of the hands and feet. Dryness of the feet. No erythema or skin breakdown     Lab Results:  Lab Results  Component Value Date   WBC 4.2 05/05/2017   HGB 13.5 05/05/2017   HCT 41.9 05/05/2017   MCV 78.2 (L) 05/05/2017   PLT 166 05/05/2017   NEUTROABS 1.1 (L) 05/05/2017    CMP     Component Value Date/Time   NA 140 05/05/2017 0853   K 3.8 05/05/2017 0853   CL 103 02/05/2017 0450   CO2 27 05/05/2017 0853   GLUCOSE 118 05/05/2017 0853   BUN 7.6 05/05/2017 0853   CREATININE 1.1 05/05/2017 0853   CALCIUM 10.3 05/05/2017 0853   PROT 7.6 05/05/2017 0853   ALBUMIN 4.1 05/05/2017 0853   AST 75 (H) 05/05/2017 0853   ALT 76 (H) 05/05/2017 0853   ALKPHOS 87 05/05/2017 0853   BILITOT 0.89 05/05/2017 0853   GFRNONAA >60 02/05/2017 0450   GFRAA >60 02/05/2017 0450     Medications: I have  reviewed the patient's current medications.  Assessment/Plan: 1. Colon cancer, sigmoid, stage IIIa (T1,N1), status post a partial colectomy 02/02/2017 ? 1/26 lymph nodes positive ? MSI-stable, no loss of mismatch repair protein expression ? Staging CTs 12/29/2016-small indeterminate left lung nodules ? Normal preoperative CEA ? Cycle 1 CAPOX 03/03/2017 ? Cycle 2 CAPOX 03/24/2017 ? Cycle 3 CAPOX 04/14/2017 ? Cycle 4 CAPOX 05/05/2017  2. Indeterminate left lung nodules on a chest CT 12/29/2016  3. Chronic red cell microcytosis  4. Diabetes  5. Hypertension  6. Family history of colon cancer  7. Multiple polyps noted on the colonoscopy 12/19/2016  8.   Neutropenia secondary to chemotherapy     Disposition:  Angel [name] appears well. He has mild neutropenia today. We will delay the final cycle of CAPOX until 05/12/2017.  He would like to schedule an appointment with Dr. Marcello Moores to discuss repair of the incisional hernia.  25 minutes were spent with the patient today. The majority of the time was used for counseling and coordination of care.  Donneta Romberg, MD  05/05/2017  10:12 AM

## 2017-05-05 NOTE — Telephone Encounter (Signed)
Call placed to [facility] to check on delivery of [place].  They stated they are unable to get in touch with patient for delivery.  Call placed to patient to inform him that [facility] needs to speak with him regarding delivery of [place].  Patient instructed to call [phone] for delivery of Xel[place].  Patient appreciative of call and states that he will call [facility] for delivery of Xel[place].

## 2017-05-05 NOTE — Telephone Encounter (Signed)
Scheduled appt per 6/8 los - unable to schedule lab and treatment for 6/15 due to capped day - sent message to get okay. Will call patient when appt is scheduled. Gave patient AVS and calender.

## 2017-05-08 ENCOUNTER — Telehealth: Payer: Self-pay | Admitting: Oncology

## 2017-05-08 NOTE — Telephone Encounter (Signed)
Called to confirm 6/18 appt with patient - patient is aware of appt date and time.

## 2017-05-09 ENCOUNTER — Other Ambulatory Visit: Payer: Self-pay | Admitting: General Surgery

## 2017-05-09 NOTE — H&P (Signed)
History of Present Illness Leighton Ruff MD; 07/07/1750 11:43 AM) The patient is a 66 year old male who presents with colorectal cancer. 66 year old male who presented to the office for evaluation of a newly diagnosed colon cancer. He had underwent a colonoscopy which showed cancer within a polyp that appears to be resected piecemeal. The area was then tattooed after the diagnosis of cancer was obtained. CT scans show no signs of metastatic disease in the liver. There are some small nodules in the lung that are felt to be benign but would require follow-up scan in approximately 6 months. He underwent a laparoscopic-assisted sigmoidectomy in late March 2018. 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 Pathology showed stage III disease. He is now finishing chemotherapy. He is tolerating a diet and having regular bowel movements.   Problem List/Past Medical Leighton Ruff, MD; 0/25/8527 11:44 AM) CANCER OF SIGMOID COLON (C18.7) INCISIONAL HERNIA, WITHOUT OBSTRUCTION OR GANGRENE (K43.2)  Past Surgical History Leighton Ruff, MD; 7/82/4235 11:44 AM) Appendectomy Colon Polyp Removal - Open  Diagnostic Studies History Leighton Ruff, MD; 3/61/4431 11:44 AM) Colonoscopy within last year  Allergies [name], [name]; 5/40/0867 61:95 AM) Quinine Derivatives Itching.  Medication History [name], [name]; 0/93/2671 24:58 AM) Atorvastatin Calcium (10MG  Tablet, Oral) Active. GlyBURIDE (5MG  Tablet, Oral) Active. Medications Reconciled Losartan Potassium-HCTZ (50-12.5MG  Tablet, Oral) Active. Omeprazole (20MG  Capsule DR, Oral) Active. OneTouch Ultra Blue (In Vitro) Active. Sildenafil Citrate (20MG  Tablet, Oral) Active. Flonase Allergy Relief (50MCG/ACT Suspension, Nasal) Active. Aspirin (81MG  Tablet DR, Oral) Active. Multivitamin Adult (Oral) Active.  Social History Leighton Ruff, MD; 0/99/8338 11:44 AM) Alcohol use Occasional alcohol  use. Caffeine use Carbonated beverages, Coffee. No drug use Tobacco use Former smoker.  Family History Leighton Ruff, MD; 2/50/5397 11:44 AM) Heart Disease Mother. Hypertension Brother, Mother, Sister.  Other Problems Leighton Ruff, MD; 6/73/4193 11:44 AM) Diabetes Mellitus High blood pressure Hypercholesterolemia     Review of Systems Leighton Ruff MD; 7/90/2409 11:44 AM) General Not Present- Appetite Loss, Chills, Fatigue, Fever, Night Sweats, Weight Gain and Weight Loss. Skin Not Present- Change in Wart/Mole, Dryness, Hives, Jaundice, New Lesions, Non-Healing Wounds, Rash and Ulcer. HEENT Present- Wears glasses/contact lenses. Not Present- Earache, Hearing Loss, Hoarseness, Nose Bleed, Oral Ulcers, Ringing in the Ears, Seasonal Allergies, Sinus Pain, Sore Throat, Visual Disturbances and Yellow Eyes. Cardiovascular Not Present- Chest Pain, Difficulty Breathing Lying Down, Leg Cramps, Palpitations, Rapid Heart Rate, Shortness of Breath and Swelling of Extremities. Gastrointestinal Not Present- Abdominal Pain, Bloating, Bloody Stool, Change in Bowel Habits, Chronic diarrhea, Constipation, Difficulty Swallowing, Excessive gas, Gets full quickly at meals, Hemorrhoids, Indigestion, Nausea, Rectal Pain and Vomiting. Musculoskeletal Present- Back Pain and Joint Pain. Not Present- Joint Stiffness, Muscle Pain, Muscle Weakness and Swelling of Extremities.  Vitals [name]; 7/35/3299 24:26 AM) 05/09/2017 11:31 AM Weight: 254 lb Height: 69.5in Body Surface Area: 2.3 m Body Mass Index: 36.97 kg/m  Temp.: 97.40F(Oral)  Pulse: 100 (Regular)  BP: 126/78 (Sitting, Left Arm, Standard)      Physical Exam Leighton Ruff MD; 8/34/1960 11:44 AM)  General Mental Status-Alert. General Appearance-Consistent with stated age. Hydration-Well hydrated. Voice-Normal.  Chest and Lung Exam Chest and lung exam reveals -quiet, even and easy respiratory  effort with no use of accessory muscles. Inspection Chest Wall - Normal. Back - normal.  Cardiovascular Cardiovascular examination reveals -normal heart sounds, regular rate and rhythm with no murmurs.  Abdomen Inspection Skin - Scar: Note: Incision: clean, dry, and intact. Hernias - Incisional -  Reducible(~12x14 cm). Palpation/Percussion Palpation and Percussion of the abdomen reveal - Soft, Non Tender, No Rebound tenderness and No Rigidity (guarding).    Assessment & Plan Leighton Ruff MD; 0/03/1101 11:42 AM)  Fatima Blank HERNIA, WITHOUT OBSTRUCTION OR GANGRENE (K43.2) Impression: 66 year old male who presents to the office for evaluation of an incisional hernia. He is status post resection for colon cancer and is finishing up adjuvant chemotherapy. He has developed an incisional hernia and would like to get this repaired. We discussed a hybrid laparoscopic approach with closure of the abdominal wall and mesh reinforcement laparoscopically. We will plan on doing this approximately 1 month after finishing his last dose of chemotherapy. Risks include bleeding, pain, infection and recurrence. I believe he understands these and has agreed to proceed. He has a job that requires heavy lifting and will not be able to return to work until he is 8 wks out from surgery.

## 2017-05-15 ENCOUNTER — Ambulatory Visit (HOSPITAL_BASED_OUTPATIENT_CLINIC_OR_DEPARTMENT_OTHER): Payer: 59

## 2017-05-15 ENCOUNTER — Telehealth: Payer: Self-pay | Admitting: Oncology

## 2017-05-15 ENCOUNTER — Other Ambulatory Visit (HOSPITAL_BASED_OUTPATIENT_CLINIC_OR_DEPARTMENT_OTHER): Payer: 59

## 2017-05-15 VITALS — BP 124/80 | HR 76 | Temp 98.7°F | Resp 18

## 2017-05-15 DIAGNOSIS — Z5111 Encounter for antineoplastic chemotherapy: Secondary | ICD-10-CM | POA: Diagnosis not present

## 2017-05-15 DIAGNOSIS — C189 Malignant neoplasm of colon, unspecified: Secondary | ICD-10-CM

## 2017-05-15 DIAGNOSIS — C187 Malignant neoplasm of sigmoid colon: Secondary | ICD-10-CM

## 2017-05-15 LAB — COMPREHENSIVE METABOLIC PANEL
ALK PHOS: 86 U/L (ref 40–150)
ALT: 55 U/L (ref 0–55)
AST: 50 U/L — AB (ref 5–34)
Albumin: 3.8 g/dL (ref 3.5–5.0)
Anion Gap: 11 mEq/L (ref 3–11)
BUN: 11.1 mg/dL (ref 7.0–26.0)
CHLORIDE: 105 meq/L (ref 98–109)
CO2: 24 meq/L (ref 22–29)
Calcium: 9.5 mg/dL (ref 8.4–10.4)
Creatinine: 1 mg/dL (ref 0.7–1.3)
EGFR: 90 mL/min/{1.73_m2} — ABNORMAL LOW (ref >90)
GLUCOSE: 209 mg/dL — AB (ref 70–140)
POTASSIUM: 4 meq/L (ref 3.5–5.1)
SODIUM: 140 meq/L (ref 136–145)
Total Bilirubin: 0.53 mg/dL (ref 0.20–1.20)
Total Protein: 7 g/dL (ref 6.4–8.3)

## 2017-05-15 LAB — CBC WITH DIFFERENTIAL/PLATELET
BASO%: 0.2 % (ref 0.0–2.0)
Basophils Absolute: 0 10*3/uL (ref 0.0–0.1)
EOS ABS: 0.2 10*3/uL (ref 0.0–0.5)
EOS%: 4.6 % (ref 0.0–7.0)
HCT: 38.3 % — ABNORMAL LOW (ref 38.4–49.9)
HGB: 12.5 g/dL — ABNORMAL LOW (ref 13.0–17.1)
LYMPH%: 51.2 % — AB (ref 14.0–49.0)
MCH: 25.6 pg — ABNORMAL LOW (ref 27.2–33.4)
MCHC: 32.6 g/dL (ref 32.0–36.0)
MCV: 78.5 fL — AB (ref 79.3–98.0)
MONO#: 0.5 10*3/uL (ref 0.1–0.9)
MONO%: 12.7 % (ref 0.0–14.0)
NEUT%: 31.3 % — ABNORMAL LOW (ref 39.0–75.0)
NEUTROS ABS: 1.3 10*3/uL — AB (ref 1.5–6.5)
NRBC: 0 % (ref 0–0)
PLATELETS: 125 10*3/uL — AB (ref 140–400)
RBC: 4.88 10*6/uL (ref 4.20–5.82)
RDW: 20.9 % — AB (ref 11.0–14.6)
WBC: 4.2 10*3/uL (ref 4.0–10.3)
lymph#: 2.1 10*3/uL (ref 0.9–3.3)

## 2017-05-15 MED ORDER — PALONOSETRON HCL INJECTION 0.25 MG/5ML
INTRAVENOUS | Status: AC
  Filled 2017-05-15: qty 5

## 2017-05-15 MED ORDER — DEXTROSE 5 % IV SOLN
Freq: Once | INTRAVENOUS | Status: AC
  Administered 2017-05-15: 10:00:00 via INTRAVENOUS

## 2017-05-15 MED ORDER — DEXAMETHASONE SODIUM PHOSPHATE 10 MG/ML IJ SOLN
INTRAMUSCULAR | Status: AC
  Filled 2017-05-15: qty 1

## 2017-05-15 MED ORDER — OXALIPLATIN CHEMO INJECTION 100 MG/20ML
126.0000 mg/m2 | Freq: Once | INTRAVENOUS | Status: AC
  Administered 2017-05-15: 300 mg via INTRAVENOUS
  Filled 2017-05-15: qty 60

## 2017-05-15 MED ORDER — DEXAMETHASONE SODIUM PHOSPHATE 10 MG/ML IJ SOLN
10.0000 mg | Freq: Once | INTRAMUSCULAR | Status: AC
  Administered 2017-05-15: 10 mg via INTRAVENOUS

## 2017-05-15 MED ORDER — PALONOSETRON HCL INJECTION 0.25 MG/5ML
0.2500 mg | Freq: Once | INTRAVENOUS | Status: AC
  Administered 2017-05-15: 0.25 mg via INTRAVENOUS

## 2017-05-15 NOTE — Progress Notes (Signed)
Okay to proceed with treatement with labs 05/15/17 (ANC 1.3) per Dr. Benay Spice, pt to have labs rechecked 05/26/17 per Dr. Benay Spice. Scheduling message sent to scheduling. Pt aware and verbalizes understanding.  Pt educated to monitor for any s/s associated for low ANC (fever, chills etc), pt verbalizes understanding. Pt stable at discharge.

## 2017-05-15 NOTE — Telephone Encounter (Signed)
lvm to inform pt of lab only appt 6/22 at 11 am per sch msg

## 2017-05-15 NOTE — Patient Instructions (Signed)
[facility] Discharge Instructions for Patients Receiving Chemotherapy  Today you received the following chemotherapy agents Oxaliplatin  To help prevent nausea and vomiting after your treatment, we encourage you to take your nausea medication as directed.   If you develop nausea and vomiting that is not controlled by your nausea medication, call the clinic.   BELOW ARE SYMPTOMS THAT SHOULD BE REPORTED IMMEDIATELY:  *FEVER GREATER THAN 100.5 F  *CHILLS WITH OR WITHOUT FEVER  NAUSEA AND VOMITING THAT IS NOT CONTROLLED WITH YOUR NAUSEA MEDICATION  *UNUSUAL SHORTNESS OF BREATH  *UNUSUAL BRUISING OR BLEEDING  TENDERNESS IN MOUTH AND THROAT WITH OR WITHOUT PRESENCE OF ULCERS  *URINARY PROBLEMS  *BOWEL PROBLEMS  UNUSUAL RASH Items with * indicate a potential emergency and should be followed up as soon as possible.  Feel free to call the clinic you have any questions or concerns. The clinic phone number is [phone].  Please show the CHEMO ALERT CARD at check-in to the Emergency Department and triage nurse.    

## 2017-05-19 ENCOUNTER — Other Ambulatory Visit: Payer: 59

## 2017-05-22 ENCOUNTER — Telehealth: Payer: Self-pay

## 2017-05-22 NOTE — Telephone Encounter (Signed)
Pt called for lab appt time and date. LVM 6/29 at 11 am.

## 2017-05-26 ENCOUNTER — Other Ambulatory Visit (HOSPITAL_BASED_OUTPATIENT_CLINIC_OR_DEPARTMENT_OTHER): Payer: 59

## 2017-05-26 DIAGNOSIS — C189 Malignant neoplasm of colon, unspecified: Secondary | ICD-10-CM | POA: Diagnosis not present

## 2017-05-26 LAB — CBC WITH DIFFERENTIAL/PLATELET
BASO%: 0.5 % (ref 0.0–2.0)
BASOS ABS: 0 10*3/uL (ref 0.0–0.1)
EOS ABS: 0.2 10*3/uL (ref 0.0–0.5)
EOS%: 3 % (ref 0.0–7.0)
HEMATOCRIT: 40.2 % (ref 38.4–49.9)
HGB: 12.8 g/dL — ABNORMAL LOW (ref 13.0–17.1)
LYMPH#: 2.2 10*3/uL (ref 0.9–3.3)
LYMPH%: 34 % (ref 14.0–49.0)
MCH: 25.8 pg — AB (ref 27.2–33.4)
MCHC: 31.9 g/dL — AB (ref 32.0–36.0)
MCV: 81 fL (ref 79.3–98.0)
MONO#: 0.8 10*3/uL (ref 0.1–0.9)
MONO%: 11.8 % (ref 0.0–14.0)
NEUT#: 3.2 10*3/uL (ref 1.5–6.5)
NEUT%: 50.7 % (ref 39.0–75.0)
PLATELETS: 150 10*3/uL (ref 140–400)
RBC: 4.96 10*6/uL (ref 4.20–5.82)
RDW: 23.8 % — ABNORMAL HIGH (ref 11.0–14.6)
WBC: 6.4 10*3/uL (ref 4.0–10.3)

## 2017-05-26 LAB — COMPREHENSIVE METABOLIC PANEL
ALT: 84 U/L — ABNORMAL HIGH (ref 0–55)
ANION GAP: 15 meq/L — AB (ref 3–11)
AST: 65 U/L — ABNORMAL HIGH (ref 5–34)
Albumin: 4 g/dL (ref 3.5–5.0)
Alkaline Phosphatase: 99 U/L (ref 40–150)
BUN: 15.3 mg/dL (ref 7.0–26.0)
CALCIUM: 10.1 mg/dL (ref 8.4–10.4)
CHLORIDE: 102 meq/L (ref 98–109)
CO2: 22 meq/L (ref 22–29)
CREATININE: 1.3 mg/dL (ref 0.7–1.3)
EGFR: 68 mL/min/{1.73_m2} — AB (ref >90)
Glucose: 275 mg/dl — ABNORMAL HIGH (ref 70–140)
Potassium: 4.2 mEq/L (ref 3.5–5.1)
Sodium: 140 mEq/L (ref 136–145)
Total Bilirubin: 0.57 mg/dL (ref 0.20–1.20)
Total Protein: 7.7 g/dL (ref 6.4–8.3)

## 2017-06-02 ENCOUNTER — Other Ambulatory Visit: Payer: Self-pay

## 2017-06-02 ENCOUNTER — Other Ambulatory Visit: Payer: 59

## 2017-06-02 ENCOUNTER — Ambulatory Visit: Payer: 59 | Admitting: Oncology

## 2017-06-02 DIAGNOSIS — C189 Malignant neoplasm of colon, unspecified: Secondary | ICD-10-CM

## 2017-06-15 NOTE — Patient Instructions (Addendum)
IMIR BRUMBACH  06/15/2017   Your procedure is scheduled on: 06-30-17  Report to [facility]  elevators to 3rd floor to  [facility] at 5:30 AM.    Call this number if you have problems the morning of surgery [phone]    Remember: ONLY 1 PERSON MAY GO WITH YOU TO SHORT STAY TO GET  READY MORNING OF [place].  Do not eat food or drink liquids :After Midnight.     Take these medicines the morning of surgery with A SIP OF WATER: Atorvastatin (Lipitor). You may also bring and use your inhaler as needed., xanax if needed  DO NOT TAKE ANY DIABETIC MEDICATIONS DAY OF YOUR SURGERY                               You may not have any metal on your body including hair pins and              piercings  Do not wear jewelry, lotions, powders, deodorant             Men may shave face and neck.   Do not bring valuables to the hospital. [place].  Contacts, dentures or bridgework may not be worn into surgery.  Leave suitcase in the car. After surgery it may be brought to your room.                  Please read over the following fact sheets you were given: _____________________________________________________________________             How to Manage Your Diabetes Before and After Surgery  Why is it important to control my blood sugar before and after surgery? . Improving blood sugar levels before and after surgery helps healing and can limit problems. . A way of improving blood sugar control is eating a healthy diet by: o  Eating less sugar and carbohydrates o  Increasing activity/exercise o  Talking with your doctor about reaching your blood sugar goals . High blood sugars (greater than 180 mg/dL) can raise your risk of infections and slow your recovery, so you will need to focus on controlling your diabetes during the weeks before surgery. . Make sure that the doctor who takes  care of your diabetes knows about your planned surgery including the date and location.  How do I manage my blood sugar before surgery? . Check your blood sugar at least 4 times a day, starting 2 days before surgery, to make sure that the level is not too high or low. o Check your blood sugar the morning of your surgery when you wake up and every 2 hours until you get to the Short Stay unit. . If your blood sugar is less than 70 mg/dL, you will need to treat for low blood sugar: o Do not take insulin. o Treat a low blood sugar (less than 70 mg/dL) with  cup of clear juice (cranberry or apple), 4 glucose tablets, OR glucose gel. o Recheck blood sugar in 15 minutes after treatment (to make sure it is greater than 70 mg/dL). If your blood sugar is not greater than 70 mg/dL on recheck, call [phone] for further instructions. . Report  your blood sugar to the short stay nurse when you get to Short Stay.  . If you are admitted to the hospital after surgery: o Your blood sugar will be checked by the staff and you will probably be given insulin after surgery (instead of oral diabetes medicines) to make sure you have good blood sugar levels. o The goal for blood sugar control after surgery is 80-180 mg/dL.   WHAT DO I DO ABOUT MY DIABETES MEDICATION?  Marland Kitchen Do not take oral diabetes medicines (pills) the morning of surgery.  . THE DAY BEFORE SURGERY, take your usual dose of Metformin. Take only your morning and/or lunch dose of Glyburide.        Patient Signature:  Date:   Nurse Signature:  Date:   Reviewed and Endorsed by [facility] Patient Education Committee, August 2015  [facility] - Preparing for Surgery Before surgery, you can play an important role.  Because skin is not sterile, your skin needs to be as free of germs as possible.  You can reduce the number of germs on your skin by washing with CHG (chlorahexidine gluconate) soap before surgery.  CHG is an antiseptic cleaner which kills  germs and bonds with the skin to continue killing germs even after washing. Please DO NOT use if you have an allergy to CHG or antibacterial soaps.  If your skin becomes reddened/irritated stop using the CHG and inform your nurse when you arrive at Short Stay. Do not shave (including legs and underarms) for at least 48 hours prior to the first CHG shower.  You may shave your face/neck. Please follow these instructions carefully:  1.  Shower with CHG Soap the night before surgery and the  morning of Surgery.  2.  If you choose to wash your hair, wash your hair first as usual with your  normal  shampoo.  3.  After you shampoo, rinse your hair and body thoroughly to remove the  shampoo.                           4.  Use CHG as you would any other liquid soap.  You can apply chg directly  to the skin and wash                       Gently with a scrungie or clean washcloth.  5.  Apply the CHG Soap to your body ONLY FROM THE NECK DOWN.   Do not use on face/ open                           Wound or open sores. Avoid contact with eyes, ears mouth and genitals (private parts).                       Wash face,  Genitals (private parts) with your normal soap.             6.  Wash thoroughly, paying special attention to the area where your surgery  will be performed.  7.  Thoroughly rinse your body with warm water from the neck down.  8.  DO NOT shower/wash with your normal soap after using and rinsing off  the CHG Soap.                9.  Pat yourself dry with a clean towel.  10.  Wear clean pajamas.            11.  Place clean sheets on your bed the night of your first shower and do not  sleep with pets. Day of Surgery : Do not apply any lotions/deodorants the morning of surgery.  Please wear clean clothes to the hospital/surgery center.  FAILURE TO FOLLOW THESE INSTRUCTIONS MAY RESULT IN THE CANCELLATION OF YOUR SURGERY PATIENT SIGNATURE_________________________________  NURSE  SIGNATURE__________________________________  ________________________________________________________________________

## 2017-06-15 NOTE — Progress Notes (Signed)
05-26-17 (EPIC) CBC w/Diff, CMP  04-07-17 (EPIC) Neg Doppler 02-10-17 (EPIC) EKG 12-29-16 (EPIC) Chest with contrast 09-20-16 (EPIC) CXR, Lungs clear   04-13-17 Office notes 09-20-16 Office notes

## 2017-06-26 ENCOUNTER — Encounter (HOSPITAL_COMMUNITY)
Admission: RE | Admit: 2017-06-26 | Discharge: 2017-06-26 | Disposition: A | Discharge status: Home / Self Care | Payer: 59 | Source: Ambulatory Visit | Attending: General Surgery | Admitting: General Surgery

## 2017-06-26 ENCOUNTER — Encounter (HOSPITAL_COMMUNITY): Payer: Self-pay

## 2017-06-26 DIAGNOSIS — K432 Incisional hernia without obstruction or gangrene: Secondary | ICD-10-CM | POA: Diagnosis not present

## 2017-06-26 DIAGNOSIS — Z01812 Encounter for preprocedural laboratory examination: Secondary | ICD-10-CM | POA: Diagnosis present

## 2017-06-26 HISTORY — DX: Gastro-esophageal reflux disease without esophagitis: K21.9

## 2017-06-26 LAB — CBC
HCT: 40.1 % (ref 39.0–52.0)
Hemoglobin: 13.2 g/dL (ref 13.0–17.0)
MCH: 26.6 pg (ref 26.0–34.0)
MCHC: 32.9 g/dL (ref 30.0–36.0)
MCV: 80.8 fL (ref 78.0–100.0)
PLATELETS: 143 10*3/uL — AB (ref 150–400)
RBC: 4.96 MIL/uL (ref 4.22–5.81)
RDW: 18.6 % — AB (ref 11.5–15.5)
WBC: 4.2 10*3/uL (ref 4.0–10.5)

## 2017-06-26 LAB — BASIC METABOLIC PANEL
Anion gap: 8 (ref 5–15)
BUN: 17 mg/dL (ref 6–20)
CALCIUM: 9 mg/dL (ref 8.9–10.3)
CHLORIDE: 101 mmol/L (ref 101–111)
CO2: 25 mmol/L (ref 22–32)
CREATININE: 1.06 mg/dL (ref 0.61–1.24)
Glucose, Bld: 306 mg/dL — ABNORMAL HIGH (ref 65–99)
Potassium: 4.1 mmol/L (ref 3.5–5.1)
SODIUM: 134 mmol/L — AB (ref 135–145)

## 2017-06-26 LAB — GLUCOSE, CAPILLARY: GLUCOSE-CAPILLARY: 290 mg/dL — AB (ref 65–99)

## 2017-06-26 NOTE — Progress Notes (Signed)
BMP routed to Dr. Marcello Moores 06/26/17 via epic [id number] pending.

## 2017-06-27 LAB — HEMOGLOBIN A1C
Hgb A1c MFr Bld: 9.3 % — ABNORMAL HIGH (ref 4.8–5.6)
Mean Plasma Glucose: 220 mg/dL

## 2017-06-27 NOTE — Progress Notes (Signed)
06-27-17 HGA1C routed to Dr. Marcello Moores for review.

## 2017-06-30 ENCOUNTER — Ambulatory Visit (HOSPITAL_COMMUNITY): Admission: RE | Admit: 2017-06-30 | Payer: 59 | Source: Ambulatory Visit | Admitting: General Surgery

## 2017-06-30 ENCOUNTER — Encounter (HOSPITAL_COMMUNITY): Admission: RE | Payer: Self-pay | Source: Ambulatory Visit

## 2017-06-30 SURGERY — REPAIR, HERNIA, INCISIONAL, LAPAROSCOPIC
Anesthesia: General

## 2017-09-18 ENCOUNTER — Ambulatory Visit: Payer: Self-pay | Admitting: General Surgery

## 2017-09-18 MED ORDER — BUPIVACAINE LIPOSOME 1.3 % IJ SUSP: 20.0000 mL | INTRAMUSCULAR | Status: AC

## 2017-10-02 NOTE — Patient Instructions (Addendum)
GARCIA DALZELL  10/02/2017   Your procedure is scheduled on: 10-06-17   Report to [facility] Main  Entrance Take [place]  Elevators to 3rd floor to  [place] at 8:00 AM.   Call this number if you have problems the morning of surgery [phone]    Remember: ONLY 1 PERSON MAY GO WITH YOU TO SHORT STAY TO GET  READY MORNING OF [place].  Do not eat food or drink liquids :After Midnight.     Take these medicines the morning of surgery with A SIP OF WATER: Atorvastatin (Lipitor) and Omeprazole (Prilosec) if needed. You may also bring and use your eyedrops and inhaler as needed.   DO NOT TAKE ANY DIABETIC MEDICATIONS DAY OF YOUR SURGERY                               You may not have any metal on your body including hair pins and              piercings  Do not wear jewelry, lotions, powders or perfumes, deodorant             Men may shave face and neck.   Do not bring valuables to the hospital. [place].  Contacts, dentures or bridgework may not be worn into surgery.  Leave suitcase in the car. After surgery it may be brought to your room.                 Please read over the following fact sheets you were given: _____________________________________________________________________ How to Manage Your Diabetes Before and After Surgery  Why is it important to control my blood sugar before and after surgery? . Improving blood sugar levels before and after surgery helps healing and can limit problems. . A way of improving blood sugar control is eating a healthy diet by: o  Eating less sugar and carbohydrates o  Increasing activity/exercise o  Talking with your doctor about reaching your blood sugar goals . High blood sugars (greater than 180 mg/dL) can raise your risk of infections and slow your recovery, so you will need to focus on controlling your diabetes during the weeks before surgery. . Make  sure that the doctor who takes care of your diabetes knows about your planned surgery including the date and location.  How do I manage my blood sugar before surgery? . Check your blood sugar at least 4 times a day, starting 2 days before surgery, to make sure that the level is not too high or low. o Check your blood sugar the morning of your surgery when you wake up and every 2 hours until you get to the Short Stay unit. . If your blood sugar is less than 70 mg/dL, you will need to treat for low blood sugar: o Do not take insulin. o Treat a low blood sugar (less than 70 mg/dL) with  cup of clear juice (cranberry or apple), 4 glucose tablets, OR glucose gel. o Recheck blood sugar in 15 minutes after treatment (to make sure it is greater than 70 mg/dL). If your blood sugar is not greater than 70 mg/dL on recheck, call [phone] for further instructions. . Report your blood sugar to the short  stay nurse when you get to Short Stay.  . If you are admitted to the hospital after surgery: o Your blood sugar will be checked by the staff and you will probably be given insulin after surgery (instead of oral diabetes medicines) to make sure you have good blood sugar levels. o The goal for blood sugar control after surgery is 80-180 mg/dL.   WHAT DO I DO ABOUT MY DIABETES MEDICATION?  Marland Kitchen Do not take oral diabetes medicines (pills) the morning of surgery.  . THE DAY BEFORE SURGERY, take ONLY your morning dose of Glyburide. You may take your usual dose of Metformin, and your 10 units of Levemir insulin.        Patient Signature:  Date:   Nurse Signature:  Date:   Reviewed and Endorsed by [facility] Patient Education Committee, August 2015            [facility] - Preparing for Surgery Before surgery, you can play an important role.  Because skin is not sterile, your skin needs to be as free of germs as possible.  You can reduce the number of germs on your skin by washing with CHG  (chlorahexidine gluconate) soap before surgery.  CHG is an antiseptic cleaner which kills germs and bonds with the skin to continue killing germs even after washing. Please DO NOT use if you have an allergy to CHG or antibacterial soaps.  If your skin becomes reddened/irritated stop using the CHG and inform your nurse when you arrive at Short Stay. Do not shave (including legs and underarms) for at least 48 hours prior to the first CHG shower.  You may shave your face/neck. Please follow these instructions carefully:  1.  Shower with CHG Soap the night before surgery and the  morning of Surgery.  2.  If you choose to wash your hair, wash your hair first as usual with your  normal  shampoo.  3.  After you shampoo, rinse your hair and body thoroughly to remove the  shampoo.                           4.  Use CHG as you would any other liquid soap.  You can apply chg directly  to the skin and wash                       Gently with a scrungie or clean washcloth.  5.  Apply the CHG Soap to your body ONLY FROM THE NECK DOWN.   Do not use on face/ open                           Wound or open sores. Avoid contact with eyes, ears mouth and genitals (private parts).                       Wash face,  Genitals (private parts) with your normal soap.             6.  Wash thoroughly, paying special attention to the area where your surgery  will be performed.  7.  Thoroughly rinse your body with warm water from the neck down.  8.  DO NOT shower/wash with your normal soap after using and rinsing off  the CHG Soap.                9.  Pat yourself dry with a clean towel.            10.  Wear clean pajamas.            11.  Place clean sheets on your bed the night of your first shower and do not  sleep with pets. Day of Surgery : Do not apply any lotions/deodorants the morning of surgery.  Please wear clean clothes to the hospital/surgery center.  FAILURE TO FOLLOW THESE INSTRUCTIONS MAY RESULT IN THE CANCELLATION OF  YOUR SURGERY PATIENT SIGNATURE_________________________________  NURSE SIGNATURE__________________________________  ________________________________________________________________________

## 2017-10-02 NOTE — Progress Notes (Addendum)
12-29-16 (EPIC) CT Chest w/ Contrast  01-31-17 (EPIC) EKG  09-28-17 CMP, HGA1C 7.9 on chart

## 2017-10-03 ENCOUNTER — Other Ambulatory Visit: Payer: Self-pay

## 2017-10-03 ENCOUNTER — Encounter (HOSPITAL_COMMUNITY): Payer: Self-pay

## 2017-10-03 ENCOUNTER — Encounter (HOSPITAL_COMMUNITY)
Admission: RE | Admit: 2017-10-03 | Discharge: 2017-10-03 | Disposition: A | Discharge status: Home / Self Care | Payer: 59 | Source: Ambulatory Visit | Attending: General Surgery | Admitting: General Surgery

## 2017-10-03 ENCOUNTER — Encounter (INDEPENDENT_AMBULATORY_CARE_PROVIDER_SITE_OTHER): Payer: Self-pay

## 2017-10-03 DIAGNOSIS — Z01812 Encounter for preprocedural laboratory examination: Secondary | ICD-10-CM | POA: Diagnosis present

## 2017-10-03 LAB — CBC
HCT: 45 % (ref 39.0–52.0)
HEMOGLOBIN: 14.5 g/dL (ref 13.0–17.0)
MCH: 24.5 pg — AB (ref 26.0–34.0)
MCHC: 32.2 g/dL (ref 30.0–36.0)
MCV: 76.1 fL — ABNORMAL LOW (ref 78.0–100.0)
Platelets: 156 10*3/uL (ref 150–400)
RBC: 5.91 MIL/uL — ABNORMAL HIGH (ref 4.22–5.81)
RDW: 14.1 % (ref 11.5–15.5)
WBC: 5.5 10*3/uL (ref 4.0–10.5)

## 2017-10-03 LAB — GLUCOSE, CAPILLARY: GLUCOSE-CAPILLARY: 131 mg/dL — AB (ref 65–99)

## 2017-10-06 ENCOUNTER — Observation Stay (HOSPITAL_COMMUNITY)
Admission: RE | Admit: 2017-10-06 | Discharge: 2017-10-09 | Disposition: A | Discharge status: Home / Self Care | Payer: 59 | Source: Ambulatory Visit | Attending: General Surgery | Admitting: General Surgery

## 2017-10-06 ENCOUNTER — Encounter (HOSPITAL_COMMUNITY): Payer: Self-pay | Admitting: Anesthesiology

## 2017-10-06 ENCOUNTER — Encounter (HOSPITAL_COMMUNITY)
Admission: RE | Disposition: A | Discharge status: Home / Self Care | Payer: Self-pay | Source: Ambulatory Visit | Attending: General Surgery

## 2017-10-06 ENCOUNTER — Other Ambulatory Visit: Payer: Self-pay

## 2017-10-06 ENCOUNTER — Ambulatory Visit (HOSPITAL_COMMUNITY): Payer: 59 | Admitting: Anesthesiology

## 2017-10-06 DIAGNOSIS — Z6836 Body mass index (BMI) 36.0-36.9, adult: Secondary | ICD-10-CM | POA: Diagnosis not present

## 2017-10-06 DIAGNOSIS — Z85038 Personal history of other malignant neoplasm of large intestine: Secondary | ICD-10-CM | POA: Diagnosis not present

## 2017-10-06 DIAGNOSIS — Z888 Allergy status to other drugs, medicaments and biological substances status: Secondary | ICD-10-CM | POA: Diagnosis not present

## 2017-10-06 DIAGNOSIS — Z7982 Long term (current) use of aspirin: Secondary | ICD-10-CM | POA: Insufficient documentation

## 2017-10-06 DIAGNOSIS — Z79899 Other long term (current) drug therapy: Secondary | ICD-10-CM | POA: Insufficient documentation

## 2017-10-06 DIAGNOSIS — K219 Gastro-esophageal reflux disease without esophagitis: Secondary | ICD-10-CM | POA: Insufficient documentation

## 2017-10-06 DIAGNOSIS — I10 Essential (primary) hypertension: Secondary | ICD-10-CM | POA: Diagnosis not present

## 2017-10-06 DIAGNOSIS — K432 Incisional hernia without obstruction or gangrene: Principal | ICD-10-CM | POA: Diagnosis present

## 2017-10-06 DIAGNOSIS — E119 Type 2 diabetes mellitus without complications: Secondary | ICD-10-CM | POA: Diagnosis not present

## 2017-10-06 DIAGNOSIS — N529 Male erectile dysfunction, unspecified: Secondary | ICD-10-CM | POA: Diagnosis not present

## 2017-10-06 DIAGNOSIS — Z794 Long term (current) use of insulin: Secondary | ICD-10-CM | POA: Insufficient documentation

## 2017-10-06 HISTORY — PX: INSERTION OF MESH: SHX5868

## 2017-10-06 HISTORY — PX: INCISIONAL HERNIA REPAIR: SHX193

## 2017-10-06 LAB — GLUCOSE, CAPILLARY
GLUCOSE-CAPILLARY: 119 mg/dL — AB (ref 65–99)
Glucose-Capillary: 166 mg/dL — ABNORMAL HIGH (ref 65–99)
Glucose-Capillary: 184 mg/dL — ABNORMAL HIGH (ref 65–99)

## 2017-10-06 SURGERY — REPAIR, HERNIA, INCISIONAL, LAPAROSCOPIC
Anesthesia: General | Site: Abdomen

## 2017-10-06 MED ORDER — INSULIN ASPART 100 UNIT/ML ~~LOC~~ SOLN
0.0000 [IU] | Freq: Three times a day (TID) | SUBCUTANEOUS | Status: DC
  Administered 2017-10-06 – 2017-10-07 (×2): 4 [IU] via SUBCUTANEOUS
  Administered 2017-10-08: 3 [IU] via SUBCUTANEOUS
  Administered 2017-10-08: 4 [IU] via SUBCUTANEOUS

## 2017-10-06 MED ORDER — SENNOSIDES-DOCUSATE SODIUM 8.6-50 MG PO TABS
1.0000 | ORAL_TABLET | Freq: Every day | ORAL | Status: DC
  Administered 2017-10-06 – 2017-10-08 (×3): 1 via ORAL
  Filled 2017-10-06 (×3): qty 1

## 2017-10-06 MED ORDER — GABAPENTIN 300 MG PO CAPS
300.0000 mg | ORAL_CAPSULE | ORAL | Status: AC
  Administered 2017-10-06: 300 mg via ORAL
  Filled 2017-10-06: qty 1

## 2017-10-06 MED ORDER — CEFAZOLIN SODIUM-DEXTROSE 2-4 GM/100ML-% IV SOLN
2.0000 g | Freq: Three times a day (TID) | INTRAVENOUS | Status: AC
  Administered 2017-10-06: 2 g via INTRAVENOUS
  Filled 2017-10-06 (×2): qty 100

## 2017-10-06 MED ORDER — OXYCODONE HCL 5 MG PO TABS: 5.0000 mg | ORAL_TABLET | Freq: Once | ORAL | 0 refills | Status: DC | PRN

## 2017-10-06 MED ORDER — PROMETHAZINE HCL 25 MG/ML IJ SOLN: 6.2500 mg | INTRAMUSCULAR | Status: DC | PRN

## 2017-10-06 MED ORDER — ENOXAPARIN SODIUM 40 MG/0.4ML ~~LOC~~ SOLN
40.0000 mg | SUBCUTANEOUS | Status: DC
  Administered 2017-10-07 – 2017-10-09 (×3): 40 mg via SUBCUTANEOUS
  Filled 2017-10-06 (×3): qty 0.4

## 2017-10-06 MED ORDER — LACTATED RINGERS IV SOLN
INTRAVENOUS | Status: DC
  Administered 2017-10-06 (×3): via INTRAVENOUS

## 2017-10-06 MED ORDER — 0.9 % SODIUM CHLORIDE (POUR BTL) OPTIME
TOPICAL | Status: DC | PRN
  Administered 2017-10-06: 1000 mL

## 2017-10-06 MED ORDER — MORPHINE SULFATE (PF) 2 MG/ML IV SOLN
2.0000 mg | INTRAVENOUS | Status: DC | PRN
  Administered 2017-10-07: 2 mg via INTRAVENOUS
  Filled 2017-10-06: qty 1

## 2017-10-06 MED ORDER — FENTANYL CITRATE (PF) 100 MCG/2ML IJ SOLN
INTRAMUSCULAR | Status: AC
  Filled 2017-10-06: qty 2

## 2017-10-06 MED ORDER — LACTATED RINGERS IR SOLN
Status: DC | PRN
  Administered 2017-10-06: 1000 mL

## 2017-10-06 MED ORDER — METFORMIN HCL 500 MG PO TABS
1000.0000 mg | ORAL_TABLET | Freq: Two times a day (BID) | ORAL | Status: DC
Start: 2017-10-06 — End: 2017-10-09
  Administered 2017-10-06 – 2017-10-09 (×6): 1000 mg via ORAL
  Filled 2017-10-06 (×6): qty 2

## 2017-10-06 MED ORDER — LIDOCAINE 2% (20 MG/ML) 5 ML SYRINGE
INTRAMUSCULAR | Status: DC | PRN
  Administered 2017-10-06: 1 mg/kg/h via INTRAVENOUS

## 2017-10-06 MED ORDER — CEFAZOLIN SODIUM-DEXTROSE 2-4 GM/100ML-% IV SOLN
2.0000 g | INTRAVENOUS | Status: AC
  Administered 2017-10-06: 2 g via INTRAVENOUS
  Filled 2017-10-06: qty 100

## 2017-10-06 MED ORDER — ROCURONIUM BROMIDE 50 MG/5ML IV SOSY
PREFILLED_SYRINGE | INTRAVENOUS | Status: AC
  Filled 2017-10-06: qty 5

## 2017-10-06 MED ORDER — OXYCODONE HCL 5 MG PO TABS
5.0000 mg | ORAL_TABLET | ORAL | Status: DC | PRN
  Administered 2017-10-07: 10 mg via ORAL
  Administered 2017-10-07: 5 mg via ORAL
  Administered 2017-10-07: 10 mg via ORAL
  Administered 2017-10-09: 5 mg via ORAL
  Filled 2017-10-06: qty 2
  Filled 2017-10-06: qty 1
  Filled 2017-10-06: qty 2
  Filled 2017-10-06: qty 1

## 2017-10-06 MED ORDER — MEPERIDINE HCL 50 MG/ML IJ SOLN: 6.2500 mg | INTRAMUSCULAR | Status: DC | PRN

## 2017-10-06 MED ORDER — LOSARTAN POTASSIUM 50 MG PO TABS
50.0000 mg | ORAL_TABLET | Freq: Every day | ORAL | Status: DC
  Administered 2017-10-06 – 2017-10-09 (×4): 50 mg via ORAL
  Filled 2017-10-06 (×4): qty 1

## 2017-10-06 MED ORDER — CALCIUM POLYCARBOPHIL 625 MG PO TABS
625.0000 mg | ORAL_TABLET | Freq: Every day | ORAL | Status: DC
  Administered 2017-10-07 – 2017-10-09 (×3): 625 mg via ORAL
  Filled 2017-10-06 (×3): qty 1

## 2017-10-06 MED ORDER — OXYCODONE HCL 5 MG/5ML PO SOLN
5.0000 mg | Freq: Once | ORAL | Status: DC | PRN
  Filled 2017-10-06: qty 5

## 2017-10-06 MED ORDER — ACETAMINOPHEN 500 MG PO TABS
1000.0000 mg | ORAL_TABLET | Freq: Four times a day (QID) | ORAL | Status: DC
  Administered 2017-10-06 – 2017-10-09 (×10): 1000 mg via ORAL
  Filled 2017-10-06 (×10): qty 2

## 2017-10-06 MED ORDER — EPHEDRINE 5 MG/ML INJ
INTRAVENOUS | Status: AC
  Filled 2017-10-06: qty 10

## 2017-10-06 MED ORDER — BUPIVACAINE LIPOSOME 1.3 % IJ SUSP
20.0000 mL | Freq: Once | INTRAMUSCULAR | Status: AC
  Administered 2017-10-06: 20 mL
  Filled 2017-10-06: qty 20

## 2017-10-06 MED ORDER — LOSARTAN POTASSIUM-HCTZ 50-12.5 MG PO TABS: 1.0000 | ORAL_TABLET | Freq: Every day | ORAL | Status: DC

## 2017-10-06 MED ORDER — HYDROCHLOROTHIAZIDE 12.5 MG PO CAPS
12.5000 mg | ORAL_CAPSULE | Freq: Every day | ORAL | Status: DC
  Administered 2017-10-06 – 2017-10-09 (×4): 12.5 mg via ORAL
  Filled 2017-10-06 (×4): qty 1

## 2017-10-06 MED ORDER — BUPIVACAINE-EPINEPHRINE 0.25% -1:200000 IJ SOLN
INTRAMUSCULAR | Status: DC | PRN
  Administered 2017-10-06: 30 mL

## 2017-10-06 MED ORDER — ONDANSETRON HCL 4 MG/2ML IJ SOLN
INTRAMUSCULAR | Status: DC | PRN
  Administered 2017-10-06: 4 mg via INTRAVENOUS

## 2017-10-06 MED ORDER — MIDAZOLAM HCL 2 MG/2ML IJ SOLN
INTRAMUSCULAR | Status: AC
  Filled 2017-10-06: qty 2

## 2017-10-06 MED ORDER — HYDROMORPHONE HCL 1 MG/ML IJ SOLN
INTRAMUSCULAR | Status: AC
  Filled 2017-10-06: qty 1

## 2017-10-06 MED ORDER — KCL IN DEXTROSE-NACL 20-5-0.45 MEQ/L-%-% IV SOLN
INTRAVENOUS | Status: DC
  Administered 2017-10-06 – 2017-10-09 (×4): via INTRAVENOUS
  Filled 2017-10-06 (×4): qty 1000

## 2017-10-06 MED ORDER — ONDANSETRON HCL 4 MG/2ML IJ SOLN: 4.0000 mg | Freq: Four times a day (QID) | INTRAMUSCULAR | Status: DC | PRN

## 2017-10-06 MED ORDER — SUGAMMADEX SODIUM 500 MG/5ML IV SOLN
INTRAVENOUS | Status: AC
  Filled 2017-10-06: qty 5

## 2017-10-06 MED ORDER — PANTOPRAZOLE SODIUM 40 MG PO TBEC
40.0000 mg | DELAYED_RELEASE_TABLET | Freq: Every day | ORAL | Status: DC
  Administered 2017-10-06 – 2017-10-09 (×4): 40 mg via ORAL
  Filled 2017-10-06 (×4): qty 1

## 2017-10-06 MED ORDER — ONDANSETRON 4 MG PO TBDP: 4.0000 mg | ORAL_TABLET | Freq: Four times a day (QID) | ORAL | Status: DC | PRN

## 2017-10-06 MED ORDER — GABAPENTIN 300 MG PO CAPS
300.0000 mg | ORAL_CAPSULE | Freq: Two times a day (BID) | ORAL | Status: DC
  Administered 2017-10-06 – 2017-10-09 (×6): 300 mg via ORAL
  Filled 2017-10-06 (×6): qty 1

## 2017-10-06 MED ORDER — FENTANYL CITRATE (PF) 100 MCG/2ML IJ SOLN
INTRAMUSCULAR | Status: DC | PRN
  Administered 2017-10-06 (×3): 50 ug via INTRAVENOUS
  Administered 2017-10-06: 100 ug via INTRAVENOUS

## 2017-10-06 MED ORDER — BISACODYL 10 MG RE SUPP: 10.0000 mg | Freq: Every day | RECTAL | Status: DC | PRN

## 2017-10-06 MED ORDER — ROCURONIUM BROMIDE 10 MG/ML (PF) SYRINGE
PREFILLED_SYRINGE | INTRAVENOUS | Status: DC | PRN
  Administered 2017-10-06: 20 mg via INTRAVENOUS
  Administered 2017-10-06: 10 mg via INTRAVENOUS
  Administered 2017-10-06: 50 mg via INTRAVENOUS
  Administered 2017-10-06 (×3): 10 mg via INTRAVENOUS

## 2017-10-06 MED ORDER — BUPIVACAINE-EPINEPHRINE (PF) 0.25% -1:200000 IJ SOLN
INTRAMUSCULAR | Status: AC
  Filled 2017-10-06: qty 30

## 2017-10-06 MED ORDER — PROPOFOL 10 MG/ML IV BOLUS
INTRAVENOUS | Status: DC | PRN
  Administered 2017-10-06: 150 mg via INTRAVENOUS

## 2017-10-06 MED ORDER — HYDROMORPHONE HCL 1 MG/ML IJ SOLN
0.2500 mg | INTRAMUSCULAR | Status: DC | PRN
  Administered 2017-10-06 (×2): 0.25 mg via INTRAVENOUS
  Administered 2017-10-06: 0.5 mg via INTRAVENOUS

## 2017-10-06 MED ORDER — GLYBURIDE 5 MG PO TABS
10.0000 mg | ORAL_TABLET | Freq: Two times a day (BID) | ORAL | Status: DC
  Administered 2017-10-06 – 2017-10-09 (×6): 10 mg via ORAL
  Filled 2017-10-06 (×8): qty 2

## 2017-10-06 MED ORDER — ACETAMINOPHEN 500 MG PO TABS
1000.0000 mg | ORAL_TABLET | ORAL | Status: AC
  Administered 2017-10-06: 1000 mg via ORAL
  Filled 2017-10-06: qty 2

## 2017-10-06 MED ORDER — LIDOCAINE 2% (20 MG/ML) 5 ML SYRINGE
INTRAMUSCULAR | Status: DC | PRN
  Administered 2017-10-06: 100 mg via INTRAVENOUS
  Administered 2017-10-06: 40 mg via INTRAVENOUS

## 2017-10-06 MED ORDER — SUGAMMADEX SODIUM 500 MG/5ML IV SOLN
INTRAVENOUS | Status: DC | PRN
  Administered 2017-10-06: 454 mg via INTRAVENOUS

## 2017-10-06 MED ORDER — ALBUTEROL SULFATE (2.5 MG/3ML) 0.083% IN NEBU: 2.5000 mg | INHALATION_SOLUTION | Freq: Four times a day (QID) | RESPIRATORY_TRACT | Status: DC | PRN

## 2017-10-06 MED ORDER — MIDAZOLAM HCL 5 MG/5ML IJ SOLN
INTRAMUSCULAR | Status: DC | PRN
  Administered 2017-10-06: 2 mg via INTRAVENOUS

## 2017-10-06 MED ORDER — LIDOCAINE 2% (20 MG/ML) 5 ML SYRINGE
INTRAMUSCULAR | Status: AC
  Filled 2017-10-06: qty 15

## 2017-10-06 MED ORDER — INSULIN ASPART 100 UNIT/ML ~~LOC~~ SOLN: 0.0000 [IU] | Freq: Every day | SUBCUTANEOUS | Status: DC

## 2017-10-06 MED ORDER — GLYBURIDE 5 MG PO TABS
10.0000 mg | ORAL_TABLET | Freq: Two times a day (BID) | ORAL | Status: DC
  Filled 2017-10-06: qty 2

## 2017-10-06 MED ORDER — OXYCODONE HCL 5 MG PO TABS: 5.0000 mg | ORAL_TABLET | Freq: Once | ORAL | Status: DC | PRN

## 2017-10-06 MED ORDER — SIMETHICONE 80 MG PO CHEW: 40.0000 mg | CHEWABLE_TABLET | Freq: Four times a day (QID) | ORAL | Status: DC | PRN

## 2017-10-06 MED ORDER — METHOCARBAMOL 500 MG PO TABS: 1000.0000 mg | ORAL_TABLET | Freq: Three times a day (TID) | ORAL | Status: DC | PRN

## 2017-10-06 MED ORDER — PROPOFOL 10 MG/ML IV BOLUS
INTRAVENOUS | Status: AC
  Filled 2017-10-06: qty 20

## 2017-10-06 MED ORDER — DOCUSATE SODIUM 100 MG PO CAPS
100.0000 mg | ORAL_CAPSULE | Freq: Three times a day (TID) | ORAL | Status: DC
  Administered 2017-10-06 – 2017-10-09 (×9): 100 mg via ORAL
  Filled 2017-10-06 (×9): qty 1

## 2017-10-06 SURGICAL SUPPLY — 42 items
ADH SKN CLS APL DERMABOND .7 (GAUZE/BANDAGES/DRESSINGS) ×1
BINDER ABDOMINAL 12 ML 46-62 (SOFTGOODS) ×3 IMPLANT
CHLORAPREP W/TINT 26ML (MISCELLANEOUS) ×3 IMPLANT
DECANTER SPIKE VIAL GLASS SM (MISCELLANEOUS) ×3 IMPLANT
DERMABOND ADVANCED (GAUZE/BANDAGES/DRESSINGS) ×2
DERMABOND ADVANCED .7 DNX12 (GAUZE/BANDAGES/DRESSINGS) ×1 IMPLANT
DEVICE PMI PUNCTURE CLOSURE (MISCELLANEOUS) ×3 IMPLANT
DEVICE SECURE STRAP 25 ABSORB (INSTRUMENTS) IMPLANT
DRSG OPSITE POSTOP 4X10 (GAUZE/BANDAGES/DRESSINGS) ×3 IMPLANT
ELECT PENCIL ROCKER SW 15FT (MISCELLANEOUS) ×3 IMPLANT
ELECT REM PT RETURN 15FT ADLT (MISCELLANEOUS) ×3 IMPLANT
GAUZE SPONGE 4X4 12PLY STRL (GAUZE/BANDAGES/DRESSINGS) ×3 IMPLANT
GLOVE BIO SURGEON STRL SZ 6.5 (GLOVE) ×2 IMPLANT
GLOVE BIO SURGEONS STRL SZ 6.5 (GLOVE) ×1
GLOVE BIOGEL PI IND STRL 7.0 (GLOVE) ×1 IMPLANT
GLOVE BIOGEL PI INDICATOR 7.0 (GLOVE) ×2
GOWN STRL REUS W/TWL 2XL LVL3 (GOWN DISPOSABLE) ×3 IMPLANT
GOWN STRL REUS W/TWL XL LVL3 (GOWN DISPOSABLE) ×6 IMPLANT
IRRIG SUCT STRYKERFLOW 2 WTIP (MISCELLANEOUS)
IRRIGATION SUCT STRKRFLW 2 WTP (MISCELLANEOUS) IMPLANT
KIT BASIN OR (CUSTOM PROCEDURE TRAY) ×3 IMPLANT
MARKER SKIN DUAL TIP RULER LAB (MISCELLANEOUS) ×3 IMPLANT
MESH VENTRALIGHT ST 4X6IN (Mesh General) ×3 IMPLANT
NEEDLE INSUFFLATION 14GA 120MM (NEEDLE) IMPLANT
NEEDLE SPNL 22GX3.5 QUINCKE BK (NEEDLE) ×3 IMPLANT
PAD ABD 8X10 STRL (GAUZE/BANDAGES/DRESSINGS) ×3 IMPLANT
PAD POSITIONING PINK XL (MISCELLANEOUS) ×3 IMPLANT
POSITIONER SURGICAL ARM (MISCELLANEOUS) ×3 IMPLANT
SCISSORS LAP 5X35 DISP (ENDOMECHANICALS) ×3 IMPLANT
SLEEVE XCEL OPT CAN 5 100 (ENDOMECHANICALS) ×3 IMPLANT
SUT NOVA NAB DX-16 0-1 5-0 T12 (SUTURE) ×12 IMPLANT
SUT VIC AB 2-0 SH 18 (SUTURE) ×6 IMPLANT
SUT VIC AB 4-0 PS2 18 (SUTURE) ×3 IMPLANT
TOWEL OR 17X26 10 PK STRL BLUE (TOWEL DISPOSABLE) ×3 IMPLANT
TOWEL OR NON WOVEN STRL DISP B (DISPOSABLE) ×3 IMPLANT
TRAY FOLEY W/METER SILVER 16FR (SET/KITS/TRAYS/PACK) IMPLANT
TRAY LAPAROSCOPIC (CUSTOM PROCEDURE TRAY) ×3 IMPLANT
TROCAR BLADELESS OPT 5 100 (ENDOMECHANICALS) ×3 IMPLANT
TROCAR XCEL 12X100 BLDLESS (ENDOMECHANICALS) ×3 IMPLANT
TROCAR XCEL BLUNT TIP 100MML (ENDOMECHANICALS) IMPLANT
TROCAR XCEL NON-BLD 11X100MML (ENDOMECHANICALS) IMPLANT
TUBING INSUF HEATED (TUBING) ×3 IMPLANT

## 2017-10-06 NOTE — Anesthesia Preprocedure Evaluation (Signed)
Anesthesia Evaluation  Patient identified by MRN, date of birth, ID band Patient awake    Reviewed: Allergy & Precautions, NPO status , Patient's Chart, lab work & pertinent test results  History of Anesthesia Complications Negative for: history of anesthetic complications  Airway Mallampati: II  TM Distance: >3 FB Neck ROM: Full    Dental  (+) Dental Advisory Given,    Pulmonary neg pulmonary ROS,    Pulmonary exam normal breath sounds clear to auscultation       Cardiovascular hypertension, Pt. on medications Normal cardiovascular exam Rhythm:Regular     Neuro/Psych negative neurological ROS  negative psych ROS   GI/Hepatic negative GI ROS, Neg liver ROS, GERD  ,  Endo/Other  diabetes, Type 2, Oral Hypoglycemic AgentsMorbid obesity  Renal/GU negative Renal ROS     Musculoskeletal negative musculoskeletal ROS (+)   Abdominal   Peds  Hematology negative hematology ROS (+)   Anesthesia Other Findings   Reproductive/Obstetrics                             Anesthesia Physical  Anesthesia Plan  ASA: III  Anesthesia Plan: General   Post-op Pain Management:    Induction: Intravenous  PONV Risk Score and Plan: 2 and Ondansetron and Midazolam  Airway Management Planned: Oral ETT  Additional Equipment:   Intra-op Plan:   Post-operative Plan: Extubation in OR  Informed Consent: I have reviewed the patients History and Physical, chart, labs and discussed the procedure including the risks, benefits and alternatives for the proposed anesthesia with the patient or authorized representative who has indicated his/her understanding and acceptance.   Dental advisory given  Plan Discussed with: CRNA  Anesthesia Plan Comments:         Anesthesia Quick Evaluation

## 2017-10-06 NOTE — Transfer of Care (Signed)
Immediate Anesthesia Transfer of Care Note  Patient: Angel [name]  Procedure(s) Performed: LAPAROSCOPIC INCISIONAL HERNIA REPAIR WITH MESH (N/A Abdomen) INSERTION OF MESH (N/A Abdomen)  Patient Location: PACU  Anesthesia Type:General  Level of Consciousness: awake, alert  and oriented  Airway & Oxygen Therapy: Patient Spontanous Breathing and Patient connected to face mask oxygen  Post-op Assessment: Report given to RN  Post vital signs: Reviewed and stable  Last Vitals:  Vitals:   10/06/17 0809 10/06/17 1301  BP: (!) 149/89   Resp: 18   Temp: 37.1 C (P) 36.7 C  SpO2: 100%     Last Pain:  Vitals:   10/06/17 1301  TempSrc:   PainSc: (P) Asleep      Patients Stated Pain Goal: 4 (16/10/96 0454)  Complications: No apparent anesthesia complications

## 2017-10-06 NOTE — Progress Notes (Signed)
Patient incision site was bleeding, dressing was covered with blood. Dressing was changed.

## 2017-10-06 NOTE — Anesthesia Postprocedure Evaluation (Signed)
Anesthesia Post Note  Patient: Angel [name]  Procedure(s) Performed: LAPAROSCOPIC INCISIONAL HERNIA REPAIR WITH MESH (N/A Abdomen) INSERTION OF MESH (N/A Abdomen)     Patient location during evaluation: PACU Anesthesia Type: General Level of consciousness: awake and alert Pain management: pain level controlled Vital Signs Assessment: post-procedure vital signs reviewed and stable Respiratory status: spontaneous breathing, nonlabored ventilation and respiratory function stable Cardiovascular status: blood pressure returned to baseline and stable Postop Assessment: no apparent nausea or vomiting Anesthetic complications: no    Last Vitals:  Vitals:   10/06/17 1350 10/06/17 1405  BP: (!) 134/91 138/74  Pulse: 87 84  Resp: 14 16  Temp: 36.4 C 36.9 C  SpO2: 92% 96%    Last Pain:  Vitals:   10/06/17 1350  TempSrc:   PainSc: Asleep                 Lynda Rainwater

## 2017-10-06 NOTE — Anesthesia Procedure Notes (Signed)
Procedure Name: Intubation Date/Time: 10/06/2017 10:05 AM Performed by: Lavina Hamman, CRNA Pre-anesthesia Checklist: Patient identified, Emergency Drugs available, Suction available, Patient being monitored and Timeout performed Patient Re-evaluated:Patient Re-evaluated prior to induction Oxygen Delivery Method: Circle system utilized Preoxygenation: Pre-oxygenation with 100% oxygen Induction Type: IV induction Ventilation: Mask ventilation without difficulty Laryngoscope Size: Mac and 4 Grade View: Grade II Tube type: Oral Tube size: 7.5 mm Number of attempts: 1 Airway Equipment and Method: Stylet Placement Confirmation: ETT inserted through vocal cords under direct vision,  positive ETCO2,  CO2 detector and breath sounds checked- equal and bilateral Secured at: 23 cm Tube secured with: Tape Dental Injury: Teeth and Oropharynx as per pre-operative assessment

## 2017-10-06 NOTE — Discharge Instructions (Signed)
HERNIA REPAIR: POST OP INSTRUCTIONS ° °1. DIET: Follow a light bland diet the first 24 hours after arrival home, such as soup, liquids, crackers, etc.  Be sure to include lots of fluids daily.  Avoid fast food or heavy meals as your are more likely to get nauseated.  Eat a low fat the next few days after surgery. °2. Take your usually prescribed home medications unless otherwise directed. °3. PAIN CONTROL: °a. Pain is best controlled by a usual combination of three different methods TOGETHER: °i. Ice/Heat °ii. Over the counter pain medication °iii. Prescription pain medication °b. Most patients will experience some swelling and bruising around the hernia(s) such as the bellybutton, groins, or old incisions.  Ice packs or heating pads (30-60 minutes up to 6 times a day) will help. Use ice for the first few days to help decrease swelling and bruising, then switch to heat to help relax tight/sore spots and speed recovery.  Some people prefer to use ice alone, heat alone, alternating between ice & heat.  Experiment to what works for you.  Swelling and bruising can take several weeks to resolve.   °c. It is helpful to take an over-the-counter pain medication regularly for the first few weeks.  Choose one of the following that works best for you: °i. Naproxen (Aleve, etc)  Two 220mg tabs twice a day °ii. Ibuprofen (Advil, etc) Three 200mg tabs four times a day (every meal & bedtime) °d. A  prescription for pain medication should be given to you upon discharge.  Take your pain medication as prescribed.  °i. If you are having problems/concerns with the prescription medicine (does not control pain, nausea, vomiting, rash, itching, etc), please call us [phone] to see if we need to switch you to a different pain medicine that will work better for you and/or control your side effect better. °ii. If you need a refill on your pain medication, please contact your pharmacy.  They will contact our office to request  authorization. Prescriptions will not be filled after 5 pm or on week-ends. °4. Avoid getting constipated.  Between the surgery and the pain medications, it is common to experience some constipation.  Increasing fluid intake and taking a fiber supplement (such as Metamucil, Citrucel, FiberCon, MiraLax, etc) 1-2 times a day regularly will usually help prevent this problem from occurring.  A mild laxative (prune juice, Milk of Magnesia, MiraLax, etc) should be taken according to package directions if there are no bowel movements after 48 hours.   °5. Wash / shower every day.  You may shower over the dressings as they are waterproof.   °6. Remove your waterproof bandages 5 days after surgery.  You may leave the incision open to air.  You may replace a dressing/Band-Aid to cover the incision for comfort if you wish.  Continue to shower over incision(s) after the dressing is off. ° ° ° °7. ACTIVITIES as tolerated:   °a. You may resume regular (light) daily activities beginning the next day--such as daily self-care, walking, climbing stairs--gradually increasing activities as tolerated.  If you can walk 30 minutes without difficulty, it is safe to try more intense activity such as jogging, treadmill, bicycling, low-impact aerobics, swimming, etc. °b. Save the most intensive and strenuous activity for last such as sit-ups, heavy lifting, contact sports, etc  Refrain from any heavy lifting or straining until you are off narcotics for pain control.   °c. DO NOT PUSH THROUGH PAIN.  Let pain be your guide: If   it hurts to do something, don't do it.  Pain is your body warning you to avoid that activity for another week until the pain goes down. °d. You may drive when you are no longer taking prescription pain medication, you can comfortably wear a seatbelt, and you can safely maneuver your car and apply brakes. °e. You may have sexual intercourse when it is comfortable.  °8. FOLLOW UP in our office °a. Please call [facility] at [phone]  [phone] to set up an appointment to see your surgeon in the office for a follow-up appointment approximately 2-3 weeks after your surgery. °b. Make sure that you call for this appointment the day you arrive home to insure a convenient appointment time. °9.  IF YOU HAVE DISABILITY OR FAMILY LEAVE FORMS, BRING THEM TO THE OFFICE FOR PROCESSING.  DO NOT GIVE THEM TO YOUR DOCTOR. ° °WHEN TO CALL US [phone]: °1. Poor pain control °2. Reactions / problems with new medications (rash/itching, nausea, etc)  °3. Fever over 101.5 F (38.5 C) °4. Inability to urinate °5. Nausea and/or vomiting °6. Worsening swelling or bruising °7. Continued bleeding from incision. °8. Increased pain, redness, or drainage from the incision ° ° The clinic staff is available to answer your questions during regular business hours (8:30am-5pm).  Please don’t hesitate to call and ask to speak to one of our nurses for clinical concerns.  ° If you have a medical emergency, go to the nearest emergency room or call 911. ° A surgeon from [facility] is always on call at the hospitals in [place] ° °[facility] °[address], [address], [place], [place]  [address] ? ° [address] [address], [place], [place]   [address] °MAIN: [phone] ? TOLL FREE: [phone] ? FAX: [phone] °[email or web address] ° °

## 2017-10-06 NOTE — Op Note (Addendum)
10/06/2017  12:52 PM  PATIENT:  Angel [name]  66 y.o. male  Patient Care Team: Seward Carol, MD as PCP - General (Internal Medicine)  PRE-OPERATIVE DIAGNOSIS:  INCISIONAL HERNIA  POST-OPERATIVE DIAGNOSIS:  INCISIONAL HERNIA  PROCEDURE:   LAPAROSCOPIC INCISIONAL HERNIA REPAIR WITH INSERTION OF MESH    Surgeon(s): Leighton Ruff, MD Johnathan Hausen, MD  ASSISTANT: Dr Hassell Done   ANESTHESIA:   local and general  EBL: 136ml Total I/O In: 1000 [I.V.:1000] Out: 150 [Blood:150]  Delay start of Pharmacological VTE agent (>24hrs) due to surgical blood loss or risk of bleeding:  no  DRAINS: none   SPECIMEN:  Source of Specimen:  hernia sac  DISPOSITION OF SPECIMEN:  PATHOLOGY  COUNTS:  YES  PLAN OF CARE: Admit for overnight observation  PATIENT DISPOSITION:  PACU - hemodynamically stable.  INDICATION: Pleasant patient has developed a ventral wall abdominal hernia.   Recommendation was made for surgical repair:  The anatomy & physiology of the abdominal wall was discussed. The pathophysiology of hernias was discussed. Natural history risks without surgery including progeressive enlargement, pain, incarceration & strangulation was discussed. Contributors to complications such as smoking, obesity, diabetes, prior surgery, etc were discussed.  I feel the risks of no intervention will lead to serious problems that outweigh the operative risks; therefore, I recommended surgery to reduce and repair the hernia. I explained laparoscopic techniques with possible need for an open approach. I noted the probable use of mesh to patch and/or buttress the hernia repair  Risks such as bleeding, infection, abscess, need for further treatment, heart attack, death, and other risks were discussed. I noted a good likelihood this will help address the problem. Goals of post-operative recovery were discussed as well. Possibility that this will not correct all symptoms was explained. I stressed the  importance of low-impact activity, aggressive pain control, avoiding constipation, & not pushing through pain to minimize risk of post-operative chronic pain or injury. Possibility of reherniation especially with smoking, obesity, diabetes, immunosuppression, and other health conditions was discussed. We will work to minimize complications.  An educational handout further explaining the pathology & treatment options was given as well. Questions were answered. The patient expresses understanding & wishes to proceed with surgery.   OR FINDINGS: 10 x 5 cm   Type of repair - Laparoscopic underlay repair   Name of mesh - Bard Ventralight dual sided (polypropylene / Seprafilm)  Size of mesh - Length 15 cm, Width 10 cm  Mesh overlap - 5 cm  Placement of mesh - Intraperitoneal underlay repair   DESCRIPTION:   Informed consent was confirmed. The patient underwent general anaesthesia without difficulty. The patient was positioned appropriately. VTE prevention in place. The patient's abdomen was clipped, prepped, & draped in a sterile fashion. Surgical timeout confirmed our plan.  The patient was positioned in reverse Trendelenburg. Abdominal entry was gained using optical entry technique in the left upper abdomen. Entry was clean. I induced carbon dioxide insufflation. Camera inspection revealed no injury. Extra ports were carefully placed under direct laparoscopic visualization.   I could see the hernia in the lower midline abdomen.    I did laparoscopic lysis of adhesions to expose the entire anterior abdominal wall.  I primarily used and focused cold scissors.   I then made a small incision over his previous scar and dissected out the hernia sac.  This was removed and sent to pathology.  Hemostasis was achieved using electrocautery.  I measured the wound to be 10 cm long  and 5 cm wide.  I shows a 15 x 10 cm mesh.  I made sure hemostasis was good.  I placed #1 Novafil stitches around its edge about  every 5 cm = 8 total.  I placed into the peritoneal cavity through the fascial defect.  I brought out the anterior and posterior sutures through the fascial wall using a suture passer.  I then closed the midline fascia using interrupted #1 Novafil sutures.  The abdomen was then reinsufflated.  I secured the mesh to cover up the hernia defect using a laparoscopic suture passer to pass the tails of the suture through the abdominal wall & tagged them with clamps.  I started out in four corners to make sure I had the mesh centered over the hernia defect appropriately, and then proceeded to work in quadrants.  We evacuated CO2 & desufflated the abdomen.  I tied the fascial stitches down.  I reinsufflated the abdomen.  The mesh provided at least 5-10 cm circumferential coverage around the entire region of hernia defects.   I tacked the edges & central part of the mesh to the peritoneum/posterior rectus fascia with SecureStrap absorbable tacks.   Hemostasis was excellent.  I irrigated the abdomen completely.  I placed a mixture of Marcaine and Exparel at the suture site and in the lateral sidewall as a tap block.   I did reinspection. Hemostasis was good. Mesh laid well. Capnoperitoneum was evacuated. Ports were removed.  The subcutaneous tissue was closed using 2-0 Vicryl interrupted sutures.  The skin was closed with 4-0 Vicryl at the port sites and staples for the midline wound.  Sterile dressing and abdominal binder were applied.  The patient was then awakened from anesthesia and sent to the postanesthesia care unit in stable condition.  All counts were correct per operating room staff.   I have reviewed the [place] and see no other active narcotics prescriptions.

## 2017-10-06 NOTE — H&P (Signed)
66 y.o. M s/p lap partial colectomy for colon cancer.  He has developed an incisional hernia.  He has occasional pain but no obstructive symptoms.  He was scheduled for surgery a couple months ago but this had to be cancelled due to uncontrolled DM.  He is now on a better regimen and has good control per his PCP.  Past Medical History:  Diagnosis Date  . Cancer [facility])    For surgery for colon ca  . Diabetes mellitus without complication ([place])    type 2  . GERD (gastroesophageal reflux disease)    at times  . Hypertension    Past Surgical History:  Procedure Laterality Date  . APPENDECTOMY     No family history on file. Social History   Socioeconomic History  . Marital status: Married    Spouse name: Not on file  . Number of children: Not on file  . Years of education: Not on file  . Highest education level: Not on file  Social Needs  . Financial resource strain: Not on file  . Food insecurity - worry: Not on file  . Food insecurity - inability: Not on file  . Transportation needs - medical: Not on file  . Transportation needs - non-medical: Not on file  Occupational History  . Not on file  Tobacco Use  . Smoking status: Never Smoker  . Smokeless tobacco: Never Used  . Tobacco comment: smoked 37 years ago for a short time  Substance and Sexual Activity  . Alcohol use: Yes    Comment: occasional  . Drug use: No  . Sexual activity: Yes  Other Topics Concern  . Not on file  Social History Narrative  . Not on file   Current Meds  Medication Sig  . aspirin EC 81 MG tablet Take 81 mg by mouth daily.  Marland Kitchen atorvastatin (LIPITOR) 10 MG tablet Take 10 mg by mouth daily.  Marland Kitchen CALCIUM PO Take 1 tablet by mouth daily.  Marland Kitchen GLUCOSAMINE PO Take 1 tablet by mouth daily as needed (when joints hurt).   . glyBURIDE (DIABETA) 5 MG tablet Take 10 mg by mouth 2 (two) times daily.   Marland Kitchen ibuprofen (ADVIL,MOTRIN) 200 MG tablet Take 200 mg by mouth every 6 (six) hours as needed for mild pain.    Marland Kitchen LEVEMIR FLEXTOUCH 100 UNIT/ML Pen 10 units once a day Subcutaneous 30 days maximum 50 units per day  . losartan-hydrochlorothiazide (HYZAAR) 50-12.5 MG tablet Take 1 tablet by mouth daily.  . metFORMIN (GLUCOPHAGE) 1000 MG tablet Take 1,000 mg by mouth 2 (two) times daily with a meal.  . Multiple Vitamin (MULTIVITAMIN WITH MINERALS) TABS tablet Take 1 tablet by mouth daily. One-A-Day for Men  . omeprazole (PRILOSEC) 20 MG capsule Take 20 mg by mouth daily as needed (for acid reflux/heartburn).   . sildenafil (REVATIO) 20 MG tablet Take 20-100 mg by mouth daily as needed for erectile dysfunction.  . VENTOLIN HFA 108 (90 Base) MCG/ACT inhaler Inhale 1-2 puffs into the lungs every 6 (six) hours as needed (for wheezing/shortness of breath).     Allergies  Allergen Reactions  . Quinine Derivatives Itching    Review of Systems - General ROS: negative for - chills or fever Respiratory ROS: no cough, shortness of breath, or wheezing Cardiovascular ROS: no chest pain or dyspnea on exertion Gastrointestinal ROS: no abdominal pain, change in bowel habits, or black or bloody stools Genito-Urinary ROS: no dysuria, trouble voiding, or hematuria   Physical Exam  Constitutional: He is oriented to person, place, and time. He appears well-developed and well-nourished.  HENT:  Head: Normocephalic and atraumatic.  Eyes: Conjunctivae and EOM are normal. Pupils are equal, round, and reactive to light.  Neck: Normal range of motion. Neck supple.  Cardiovascular: Normal rate, regular rhythm and normal heart sounds.  Pulmonary/Chest: Effort normal. No respiratory distress.  Abdominal: Soft. There is no tenderness. A hernia is present.  Musculoskeletal: Normal range of motion.  Neurological: He is alert and oriented to person, place, and time.   Assessment: Incisional hernia DM  HTN  Plan: OR for lap incisional hernia repair with mesh.  We have discussed risks such as bleeding, pain, constipation,  ileus, infection, need for additional surgery and recurrence.  I believe he understands this and has agreed to proceed.

## 2017-10-07 DIAGNOSIS — K432 Incisional hernia without obstruction or gangrene: Secondary | ICD-10-CM | POA: Diagnosis not present

## 2017-10-07 LAB — GLUCOSE, CAPILLARY
GLUCOSE-CAPILLARY: 110 mg/dL — AB (ref 65–99)
Glucose-Capillary: 100 mg/dL — ABNORMAL HIGH (ref 65–99)
Glucose-Capillary: 160 mg/dL — ABNORMAL HIGH (ref 65–99)
Glucose-Capillary: 107 mg/dL — ABNORMAL HIGH (ref 65–99)

## 2017-10-07 MED ORDER — PHENOL 1.4 % MT LIQD
1.0000 | OROMUCOSAL | Status: DC | PRN
  Filled 2017-10-07: qty 177

## 2017-10-07 NOTE — Progress Notes (Signed)
1 Day Post-Op   Subjective/Chief Complaint: No n/v, but doesn't feel great.  Mild pain only.   Objective: Vital signs in last 24 hours: Temp:  [97.6 F (36.4 C)-99.3 F (37.4 C)] 97.7 F (36.5 C) (11/10 0524) Pulse Rate:  [69-89] 69 (11/10 0524) Resp:  [13-18] 18 (11/10 0524) BP: (112-143)/(66-91) 112/66 (11/10 0524) SpO2:  [92 %-98 %] 98 % (11/10 0524) Last BM Date: 10/06/17  Intake/Output from previous day: 11/09 0701 - 11/10 0700 In: 2178.8 [I.V.:2178.8] Out: 2250 [Urine:2100; Blood:150] Intake/Output this shift: No intake/output data recorded.  General appearance: alert, cooperative and mild distress Resp: breathing comfortably GI: soft, non tender, sl distended Extremities: no c/c/e  Lab Results:  No results for input(s): WBC, HGB, HCT, PLT in the last 72 hours. BMET No results for input(s): NA, K, CL, CO2, GLUCOSE, BUN, CREATININE, CALCIUM in the last 72 hours. PT/INR No results for input(s): LABPROT, INR in the last 72 hours. ABG No results for input(s): PHART, HCO3 in the last 72 hours.  Invalid input(s): PCO2, PO2  Studies/Results: No results found.  Anti-infectives: Anti-infectives (From admission, onward)   Start     Dose/Rate Route Frequency Ordered Stop   10/06/17 1800  ceFAZolin (ANCEF) IVPB 2g/100 mL premix     2 g 200 mL/hr over 30 Minutes Intravenous Every 8 hours 10/06/17 1414 10/06/17 1902   10/06/17 0812  ceFAZolin (ANCEF) IVPB 2g/100 mL premix     2 g 200 mL/hr over 30 Minutes Intravenous On call to O.R. 10/06/17 0812 10/06/17 1017      Assessment/Plan: s/p Procedure(s): LAPAROSCOPIC INCISIONAL HERNIA REPAIR WITH MESH (N/A) INSERTION OF MESH (N/A)   Await return of bowel function.  Encouraged mobility   LOS: 0 days    Obinna Ehresman 10/07/2017

## 2017-10-07 NOTE — Progress Notes (Signed)
Dr. Barry Dienes into see pt. MD aware pt c/o of throat soreness and mild SOB. lungs relatively clear with O2 sats 100%. No distress. Chloraseptic spray ordered for pt.

## 2017-10-08 DIAGNOSIS — K432 Incisional hernia without obstruction or gangrene: Secondary | ICD-10-CM | POA: Diagnosis not present

## 2017-10-08 LAB — GLUCOSE, CAPILLARY
GLUCOSE-CAPILLARY: 142 mg/dL — AB (ref 65–99)
GLUCOSE-CAPILLARY: 127 mg/dL — AB (ref 65–99)
Glucose-Capillary: 159 mg/dL — ABNORMAL HIGH (ref 65–99)
Glucose-Capillary: 100 mg/dL — ABNORMAL HIGH (ref 65–99)

## 2017-10-08 NOTE — Progress Notes (Signed)
2 Days Post-Op   Subjective/Chief Complaint: Tolerating full liquids.  No n/v.  Starting having flatus.  No bm yet.  Feels much better.     Objective: Vital signs in last 24 hours: Temp:  [98.4 F (36.9 C)-99.4 F (37.4 C)] 98.5 F (36.9 C) (11/11 0620) Pulse Rate:  [74-112] 112 (11/11 0620) Resp:  [16-20] 16 (11/11 0620) BP: (109-131)/(73-82) 109/82 (11/11 0620) SpO2:  [95 %-100 %] 95 % (11/11 0620) Last BM Date: 10/06/17  Intake/Output from previous day: 11/10 0701 - 11/11 0700 In: 4300 [P.O.:600; I.V.:3700] Out: 2100 [Urine:2100] Intake/Output this shift: No intake/output data recorded.  General appearance: alert, cooperative and no distress Resp: breathing comfortably GI: soft, non tender, min distended.  Incision c/d/i.  Binder in place.   Extremities: no c/c/e  Lab Results:  No results for input(s): WBC, HGB, HCT, PLT in the last 72 hours. BMET No results for input(s): NA, K, CL, CO2, GLUCOSE, BUN, CREATININE, CALCIUM in the last 72 hours. PT/INR No results for input(s): LABPROT, INR in the last 72 hours. ABG No results for input(s): PHART, HCO3 in the last 72 hours.  Invalid input(s): PCO2, PO2  Studies/Results: No results found.  Anti-infectives: Anti-infectives (From admission, onward)   Start     Dose/Rate Route Frequency Ordered Stop   10/06/17 1800  ceFAZolin (ANCEF) IVPB 2g/100 mL premix     2 g 200 mL/hr over 30 Minutes Intravenous Every 8 hours 10/06/17 1414 10/06/17 1902   10/06/17 0812  ceFAZolin (ANCEF) IVPB 2g/100 mL premix     2 g 200 mL/hr over 30 Minutes Intravenous On call to O.R. 10/06/17 0812 10/06/17 1017      Assessment/Plan: s/p Procedure(s): LAPAROSCOPIC INCISIONAL HERNIA REPAIR WITH MESH (N/A) INSERTION OF MESH (N/A)   Advance diet. Hopefully home tomorrow.   LOS: 0 days    [facility] 10/08/2017

## 2017-10-09 DIAGNOSIS — K432 Incisional hernia without obstruction or gangrene: Secondary | ICD-10-CM | POA: Diagnosis not present

## 2017-10-09 LAB — GLUCOSE, CAPILLARY
Glucose-Capillary: 82 mg/dL (ref 65–99)
Glucose-Capillary: 100 mg/dL — ABNORMAL HIGH (ref 65–99)

## 2017-10-09 MED ORDER — DOCUSATE SODIUM 100 MG PO CAPS: 100.0000 mg | ORAL_CAPSULE | Freq: Three times a day (TID) | ORAL | 0 refills | Status: DC

## 2017-10-09 MED ORDER — BISACODYL 10 MG RE SUPP
10.0000 mg | Freq: Once | RECTAL | Status: AC
  Administered 2017-10-09: 10 mg via RECTAL
  Filled 2017-10-09: qty 1

## 2017-10-09 NOTE — Progress Notes (Signed)
3 Days Post-Op   Subjective/Chief Complaint: Tolerating a diet  No n/v.  having flatus.  No bm yet.  Feels somewhat better.     Objective: Vital signs in last 24 hours: Temp:  [98.5 F (36.9 C)-99.2 F (37.3 C)] 99.2 F (37.3 C) (11/12 0653) Pulse Rate:  [91-96] 92 (11/12 0653) Resp:  [18] 18 (11/12 0653) BP: (112-132)/(68-80) 132/80 (11/12 0653) SpO2:  [98 %-99 %] 98 % (11/12 0653) Last BM Date: 10/06/17  Intake/Output from previous day: 11/11 0701 - 11/12 0700 In: 2760 [P.O.:1560; I.V.:1200] Out: 3275 [Urine:3275] Intake/Output this shift: Total I/O In: -  Out: 150 [Urine:150]  General appearance: alert, cooperative and no distress Resp: breathing comfortably GI: soft, non tender, min distended.  Incision c/d/i.  Binder in place.     Lab Results:  No results for input(s): WBC, HGB, HCT, PLT in the last 72 hours. BMET No results for input(s): NA, K, CL, CO2, GLUCOSE, BUN, CREATININE, CALCIUM in the last 72 hours. PT/INR No results for input(s): LABPROT, INR in the last 72 hours. ABG No results for input(s): PHART, HCO3 in the last 72 hours.  Invalid input(s): PCO2, PO2  Studies/Results: No results found.  Anti-infectives: Anti-infectives (From admission, onward)   Start     Dose/Rate Route Frequency Ordered Stop   10/06/17 1800  ceFAZolin (ANCEF) IVPB 2g/100 mL premix     2 g 200 mL/hr over 30 Minutes Intravenous Every 8 hours 10/06/17 1414 10/06/17 1902   10/06/17 0812  ceFAZolin (ANCEF) IVPB 2g/100 mL premix     2 g 200 mL/hr over 30 Minutes Intravenous On call to O.R. 10/06/17 0812 10/06/17 1017      Assessment/Plan: s/p Procedure(s): LAPAROSCOPIC INCISIONAL HERNIA REPAIR WITH MESH (N/A) INSERTION OF MESH (N/A)   Cont soft diet. Suppository today Hopefully home later today or tomorrow.   LOS: 0 days    Angel [name] C. [id number]/72/6203

## 2017-10-09 NOTE — Addendum Note (Signed)
Addendum  created 10/09/17 0857 by Lavina Hamman, CRNA   Intraprocedure Staff edited

## 2017-10-10 NOTE — Discharge Summary (Signed)
Patient ID: Angel [name] [id number] 66 y.o. [birth year]  10/06/2017  Discharge date and time: 10/09/2017  4:31 PM  Admitting Physician: Rosario Adie  Discharge Physician: Rosario Adie.  Admission Diagnoses: INCISIONAL HERNIA  Discharge Diagnoses: Incisional hernia  Operations:  LAPAROSCOPIC INCISIONAL HERNIA REPAIR WITH MESH INSERTION OF MESH    Discharged Condition: good    Hospital Course: Pt admitted after surgery.  His diet was advanced as tolerated.  He did develop an ileus after surgery and remained in the hospital due to this and pain control.  By postop day 3 he was tolerating a diet and having bowel function.  He was discharged in stable condition.  Consults: None  Significant Diagnostic Studies: labs: cbc, bmet  Treatments: IV hydration, analgesia: Morphine, insulin: regular and surgery: lap hernia repair  Disposition: Home

## 2017-12-10 IMAGING — CT CT ABD-PELV W/ CM
3 of 5 series · 14 of 36 positions shown, 17 images · IV contrast (APPLIED)
Comparison: AP CT on 10/21/2009

CLINICAL DATA: Newly diagnosed sigmoid colon adenocarcinoma.
Staging.

EXAM:
CT CHEST, ABDOMEN, AND PELVIS WITH CONTRAST
TECHNIQUE: Multidetector CT imaging of the chest, abdomen and pelvis was
performed following the standard protocol during bolus
administration of intravenous contrast.
CONTRAST:  100mL 400IFK-JUU IOPAMIDOL (400IFK-JUU) INJECTION 61%

[Series 2: cap with · axial · 0.98mm/px · z∈[+1056,+1606]mm · 9 of 138 slices shown, 12 images]
[im 14/138  mediastinal]
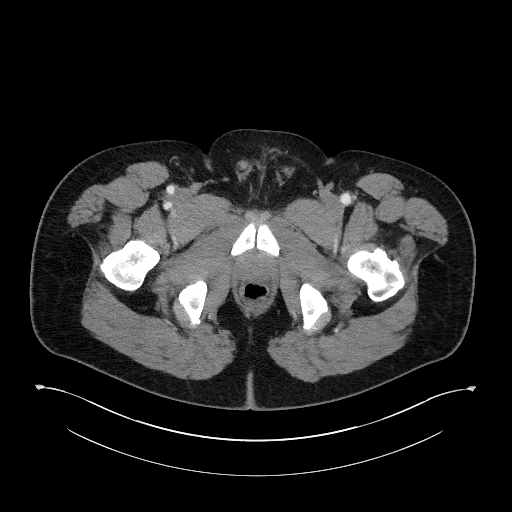
[im 14/138  lung]
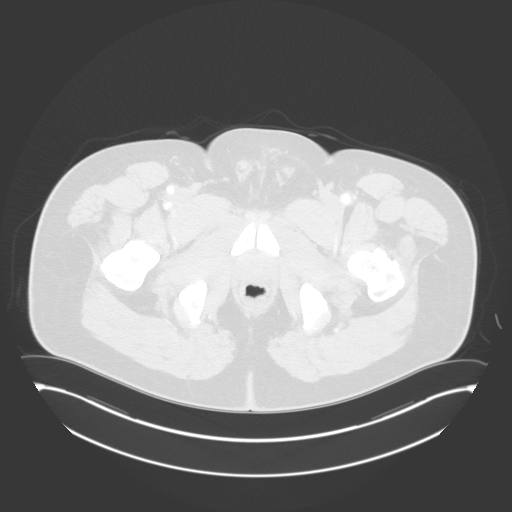
[im 28/138  lung]
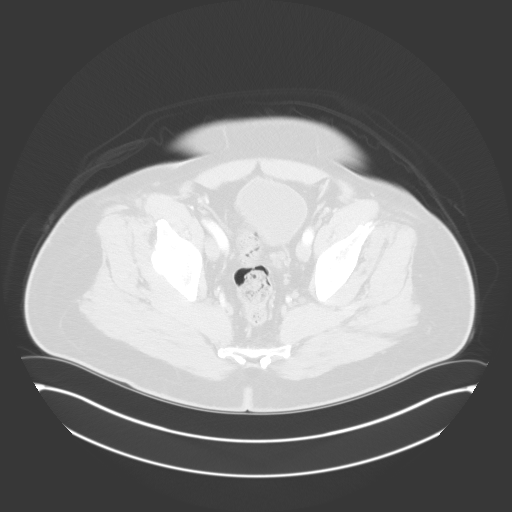
[im 42/138  lung]
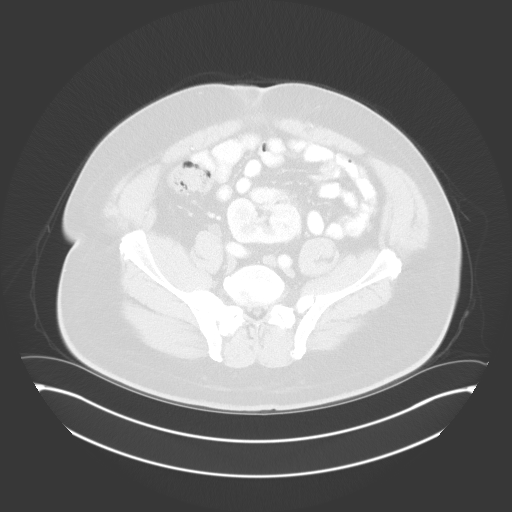
[im 55/138  lung]
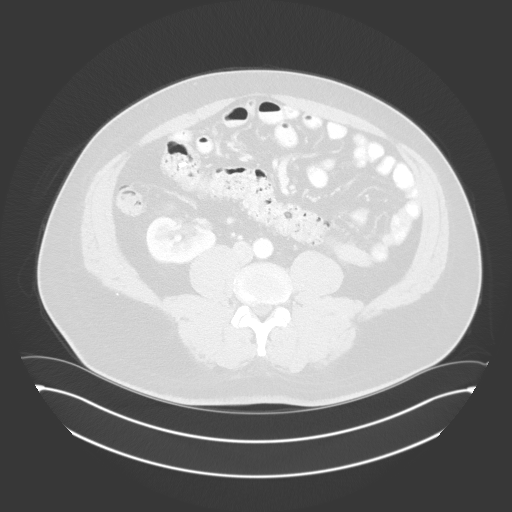
[im 69/138  mediastinal]
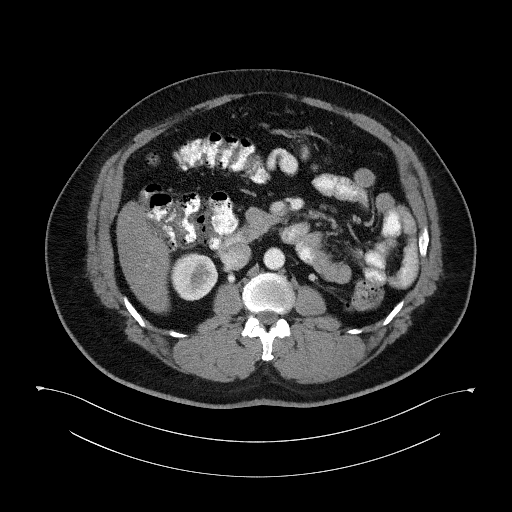
[im 69/138  lung]
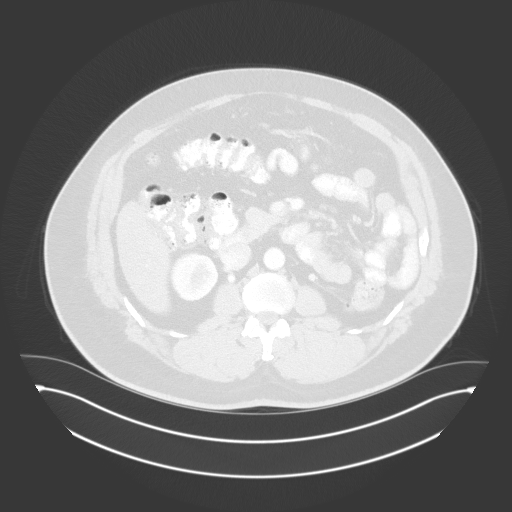
[im 83/138  lung]
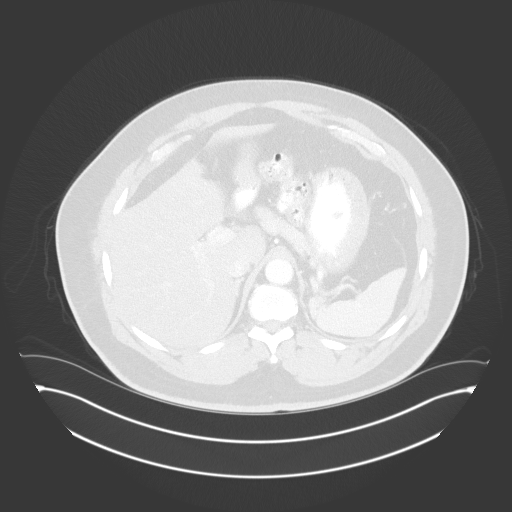
[im 96/138  lung]
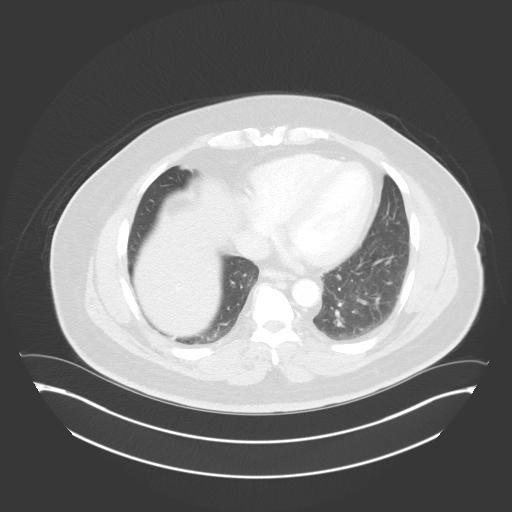
[im 110/138  lung]
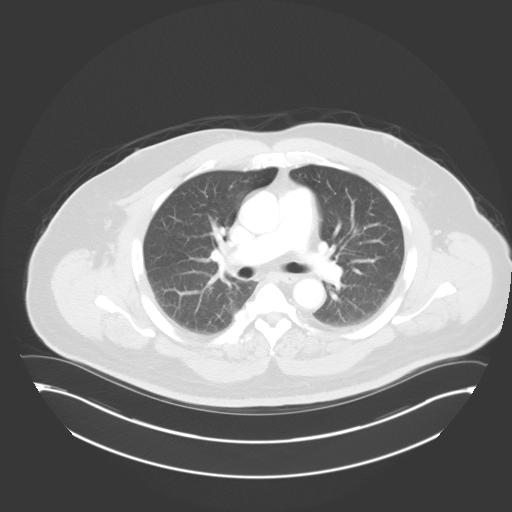
[im 124/138  mediastinal]
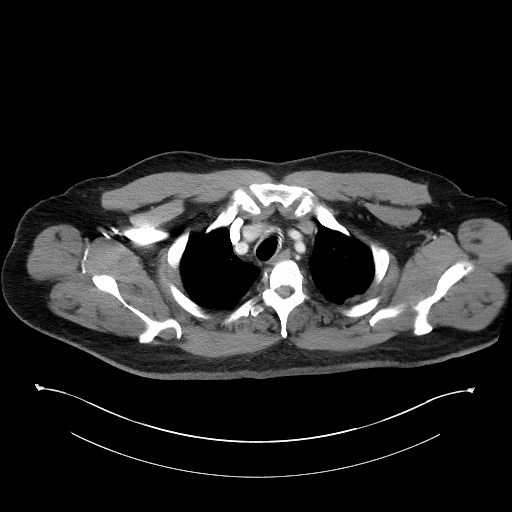
[im 124/138  lung]
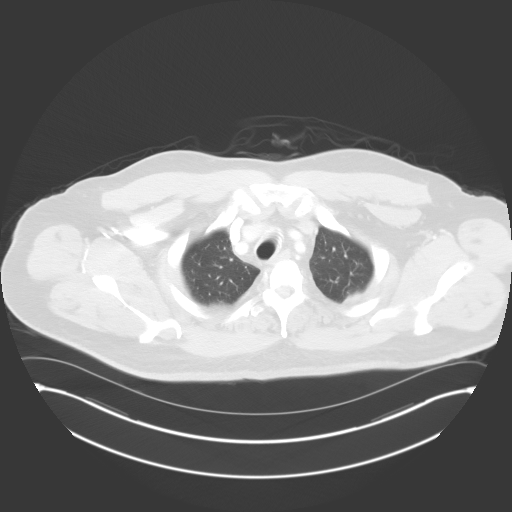

[Series 4: lung · axial · 0.98mm/px · z∈[+1372,+1428]mm · 2 of 166 slices shown]
[im 14/166  lung]
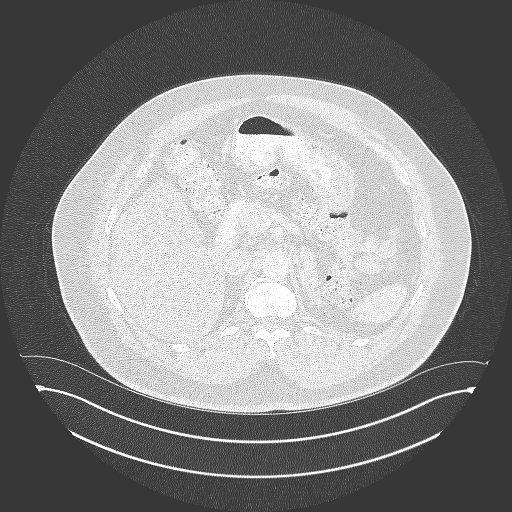
[im 42/166  lung]
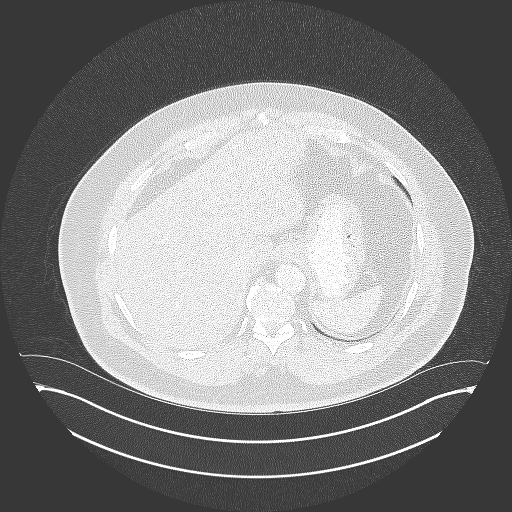

[Series 5: coronals · coronal · 0.92mm/px · 3 of 175 slices shown]
[im 35/175  lung]
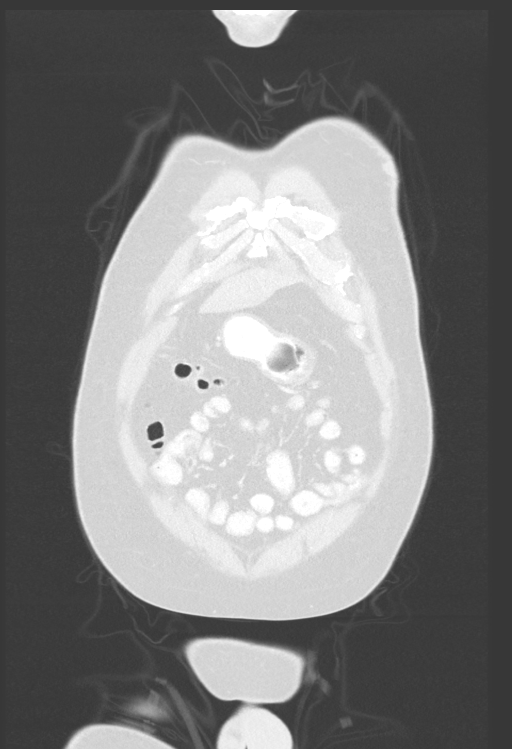
[im 70/175  lung]
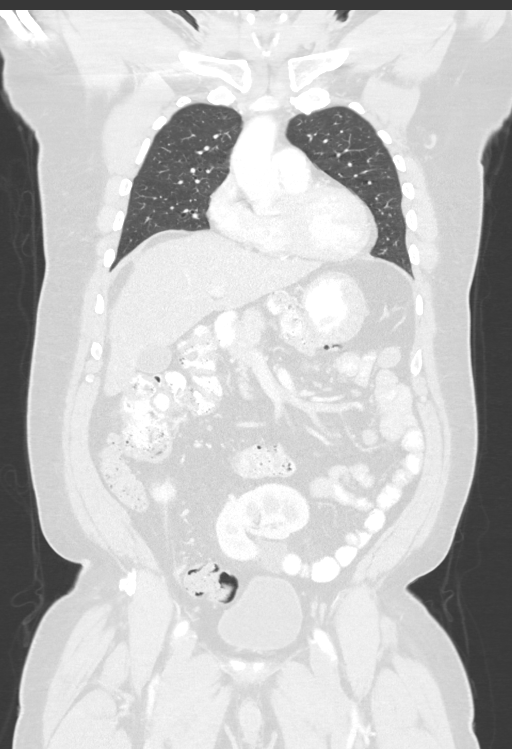
[im 105/175  lung]
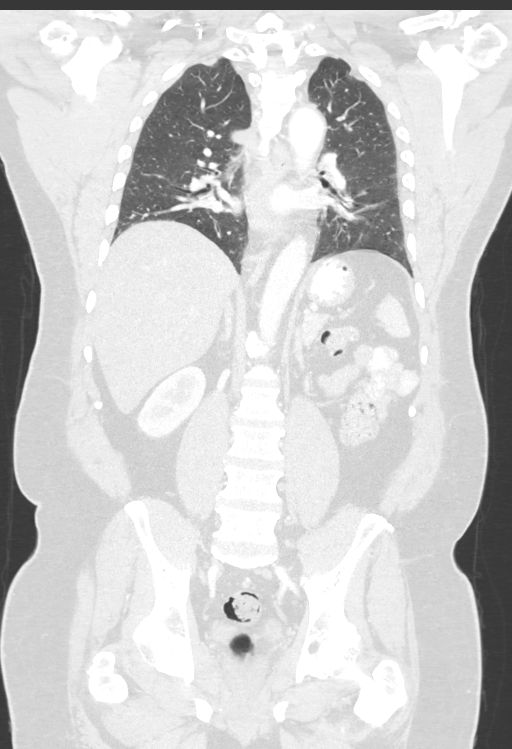

[14 of 36 positions shown; findings below may reference images not displayed]

FINDINGS: CT CHEST FINDINGS

Cardiovascular: No acute findings. Aortic and coronary artery
atherosclerosis.

Mediastinum/Lymph Nodes: No masses or pathologically enlarged lymph
nodes identified.

Lungs/Pleura: Several tiny sub-cm pulmonary nodules seen in the left
upper and lower lobes, largest measuring 5 mm. Mild scarring noted
within the right lung. No evidence of pulmonary infiltrate or
pleural effusion.

Musculoskeletal:  No suspicious bone lesions identified.

CT ABDOMEN AND PELVIS FINDINGS

Hepatobiliary: No masses identified. Increased mild to moderate
diffuse hepatic steatosis. Gallbladder is unremarkable.

Pancreas:  No mass or inflammatory changes.

Spleen:  Within normal limits in size and appearance.

Adrenals/Urinary tract: Left pelvic kidney again noted. Right kidney
is normal and location in appearance. No masses or hydronephrosis.
Unopacified urinary bladder is unremarkable in appearance.

Stomach/Bowel: No evidence of obstruction, inflammatory process, or
abnormal fluid collections. No masses visualized by CT.

Vascular/Lymphatic: No pathologically enlarged lymph nodes
identified. No abdominal aortic aneurysm.

Reproductive:  No mass or other significant abnormality identified.

Other: Stable tiny periumbilical ventral hernia containing only fat.

Musculoskeletal:  No suspicious bone lesions identified.
IMPRESSION: No definite evidence of metastatic disease within the chest,
abdomen, or pelvis.

Tiny indeterminate left lung nodules measuring up to 5 mm. Recommend
continued followup by chest CT in 6 months.

Increased diffuse hepatic steatosis.

Incidental findings including aortic and coronary artery
atherosclerosis, and left pelvic kidney.

## 2018-01-30 ENCOUNTER — Ambulatory Visit
Admission: RE | Admit: 2018-01-30 | Discharge: 2018-01-30 | Disposition: A | Discharge status: Home / Self Care | Payer: 59 | Source: Ambulatory Visit | Attending: Family Medicine | Admitting: Family Medicine

## 2018-01-30 ENCOUNTER — Other Ambulatory Visit: Payer: Self-pay | Admitting: Family Medicine

## 2018-01-30 DIAGNOSIS — R062 Wheezing: Secondary | ICD-10-CM

## 2018-01-30 DIAGNOSIS — R059 Cough, unspecified: Secondary | ICD-10-CM

## 2018-01-30 DIAGNOSIS — R509 Fever, unspecified: Secondary | ICD-10-CM

## 2018-01-30 DIAGNOSIS — R05 Cough: Secondary | ICD-10-CM

## 2018-10-03 ENCOUNTER — Ambulatory Visit
Admission: RE | Admit: 2018-10-03 | Discharge: 2018-10-03 | Disposition: A | Discharge status: Home / Self Care | Payer: Managed Care, Other (non HMO) | Source: Ambulatory Visit | Attending: Internal Medicine | Admitting: Internal Medicine

## 2018-10-03 ENCOUNTER — Other Ambulatory Visit: Payer: Self-pay | Admitting: Internal Medicine

## 2018-10-03 DIAGNOSIS — R059 Cough, unspecified: Secondary | ICD-10-CM

## 2018-10-03 DIAGNOSIS — R05 Cough: Secondary | ICD-10-CM

## 2018-10-23 ENCOUNTER — Inpatient Hospital Stay: Payer: Managed Care, Other (non HMO) | Attending: Oncology | Admitting: Oncology

## 2018-10-23 ENCOUNTER — Telehealth: Payer: Self-pay

## 2018-10-23 ENCOUNTER — Inpatient Hospital Stay: Payer: Managed Care, Other (non HMO)

## 2018-10-23 VITALS — BP 129/81 | HR 69 | Temp 97.9°F | Resp 18 | Ht 69.5 in | Wt 261.8 lb

## 2018-10-23 DIAGNOSIS — D701 Agranulocytosis secondary to cancer chemotherapy: Secondary | ICD-10-CM | POA: Diagnosis not present

## 2018-10-23 DIAGNOSIS — R718 Other abnormality of red blood cells: Secondary | ICD-10-CM | POA: Diagnosis not present

## 2018-10-23 DIAGNOSIS — Z9221 Personal history of antineoplastic chemotherapy: Secondary | ICD-10-CM

## 2018-10-23 DIAGNOSIS — Z800 Family history of malignant neoplasm of digestive organs: Secondary | ICD-10-CM

## 2018-10-23 DIAGNOSIS — C187 Malignant neoplasm of sigmoid colon: Secondary | ICD-10-CM

## 2018-10-23 DIAGNOSIS — E119 Type 2 diabetes mellitus without complications: Secondary | ICD-10-CM

## 2018-10-23 DIAGNOSIS — I10 Essential (primary) hypertension: Secondary | ICD-10-CM | POA: Diagnosis not present

## 2018-10-23 LAB — BASIC METABOLIC PANEL - CANCER CENTER ONLY
Anion gap: 9 (ref 5–15)
BUN: 15 mg/dL (ref 8–23)
CALCIUM: 9.7 mg/dL (ref 8.9–10.3)
CHLORIDE: 102 mmol/L (ref 98–111)
CO2: 28 mmol/L (ref 22–32)
CREATININE: 1.19 mg/dL (ref 0.61–1.24)
GFR, Estimated: >60 mL/min (ref >60)
Glucose, Bld: 106 mg/dL — ABNORMAL HIGH (ref 70–99)
Potassium: 4 mmol/L (ref 3.5–5.1)
SODIUM: 139 mmol/L (ref 135–145)

## 2018-10-23 LAB — CEA (IN HOUSE-CHCC): CEA (CHCC-In House): 2.39 ng/mL (ref 0.00–5.00)

## 2018-10-23 NOTE — Patient Instructions (Signed)
Contact our social worker if you require any assistance with your Advanced Directive

## 2018-10-23 NOTE — Telephone Encounter (Signed)
Printed avs and calender of upcoming appointment. Per 11/25 los. Arrived patient for labs, also gave contrast and CT information

## 2018-10-23 NOTE — Progress Notes (Signed)
  Angel [name]   Diagnosis: Colon cancer  INTERVAL HISTORY:   Angel [name] was last seen at the Cancer center in June 2018.  He underwent repair of an incisional hernia in November 2018.  Angel [name] is followed by Dr. Delfina Redwood for primary care.  He reports feeling well.  No difficulty with bowel function.  He has not undergone a surveillance colonoscopy or CTs since completing chemotherapy.  Objective:  Vital signs in last 24 hours:  Blood pressure 129/81, pulse 69, temperature 97.9 F (36.6 C), temperature source Oral, resp. rate 18, height 5' 9.5" (1.765 m), weight 261 lb 12.8 oz (118.8 kg), SpO2 99 %.    HEENT: Neck without mass Lymphatics: Cervical, supraclavicular, axillary, or inguinal nodes Resp: Lungs clear bilaterally Cardio: Regular rate and rhythm GI: No hepatosplenomegaly, no mass, firm thickening surrounding the inferior aspect of the midline scar, no discrete mass Vascular: No leg edema   Lab: CEA pending   Medications: I have reviewed the patient's current medications.   Assessment/Plan: 1. Colon cancer, sigmoid, stage IIIa (T1,N1), status post a partial colectomy 02/02/2017 ? 1/26 lymph nodes positive ? MSI-stable, no loss of mismatch repair protein expression ? Staging CTs 12/29/2016-small indeterminate left lung nodules ? Normal preoperative CEA ? Cycle 1 CAPOX 03/03/2017 ? Cycle 2 CAPOX 03/24/2017 ? Cycle 3 CAPOX 04/14/2017 ? Cycle 4 CAPOX 05/05/2017  2. Indeterminate left lung nodules on a chest CT 12/29/2016  3. Chronic red cell microcytosis  4. Diabetes  5. Hypertension  6. Family history of colon cancer  7. Multiple polyps noted on the colonoscopy 12/19/2016  8.   Neutropenia secondary to chemotherapy     Disposition: Angel [name] remains in clinical remission from colon cancer.  He will be scheduled for restaging CTs within the next few weeks.  He will return to the lab for a  chemistry panel and CEA today.  I will refer him to Dr. Watt Climes for a surveillance colonoscopy.  Angel [name] will return for an office visit and CEA in 6 months.  Betsy Coder, MD  10/23/2018  8:22 AM

## 2018-10-24 ENCOUNTER — Telehealth: Payer: Self-pay | Admitting: *Deleted

## 2018-10-24 NOTE — Telephone Encounter (Signed)
-----   Message from Ladell Pier, MD sent at 10/23/2018  8:15 PM EST ----- Please call patient, cea is normal, proceed with surveillance CTs, f/u as scheduled

## 2018-10-26 ENCOUNTER — Encounter: Payer: Self-pay | Admitting: *Deleted

## 2018-10-31 NOTE — Telephone Encounter (Signed)
Left VM with normal cea result and f/u as scheduled with md and surveillance cts

## 2018-11-06 ENCOUNTER — Ambulatory Visit (HOSPITAL_COMMUNITY)
Admission: RE | Admit: 2018-11-06 | Discharge: 2018-11-06 | Disposition: A | Discharge status: Home / Self Care | Payer: Managed Care, Other (non HMO) | Source: Ambulatory Visit | Attending: Oncology | Admitting: Oncology

## 2018-11-06 DIAGNOSIS — C189 Malignant neoplasm of colon, unspecified: Secondary | ICD-10-CM | POA: Diagnosis not present

## 2018-11-06 DIAGNOSIS — C187 Malignant neoplasm of sigmoid colon: Secondary | ICD-10-CM | POA: Insufficient documentation

## 2018-11-06 MED ORDER — IOHEXOL 300 MG/ML  SOLN
100.0000 mL | Freq: Once | INTRAMUSCULAR | Status: AC | PRN
  Administered 2018-11-06: 100 mL via INTRAVENOUS

## 2018-11-06 MED ORDER — SODIUM CHLORIDE (PF) 0.9 % IJ SOLN
INTRAMUSCULAR | Status: AC
  Filled 2018-11-06: qty 50

## 2018-11-09 ENCOUNTER — Telehealth: Payer: Self-pay | Admitting: *Deleted

## 2018-11-09 NOTE — Telephone Encounter (Signed)
TCT patient regarding results of recent CT scan. Spoke with patient and informed him that his recent CT scan showed no signs of cancer. Pt pleased and grateful for the phone call. He is aware of his f/u appt in May 2020. No questions or concerns.

## 2018-12-05 DIAGNOSIS — R946 Abnormal results of thyroid function studies: Secondary | ICD-10-CM | POA: Diagnosis not present

## 2019-01-16 DIAGNOSIS — E039 Hypothyroidism, unspecified: Secondary | ICD-10-CM | POA: Diagnosis not present

## 2019-02-25 ENCOUNTER — Other Ambulatory Visit: Payer: Self-pay

## 2019-02-25 ENCOUNTER — Ambulatory Visit (HOSPITAL_COMMUNITY)
Admission: EM | Admit: 2019-02-25 | Discharge: 2019-02-25 | Disposition: A | Discharge status: Home / Self Care | Payer: Managed Care, Other (non HMO) | Attending: Family Medicine | Admitting: Family Medicine

## 2019-02-25 ENCOUNTER — Ambulatory Visit (INDEPENDENT_AMBULATORY_CARE_PROVIDER_SITE_OTHER): Payer: Managed Care, Other (non HMO)

## 2019-02-25 ENCOUNTER — Encounter (HOSPITAL_COMMUNITY): Payer: Self-pay

## 2019-02-25 DIAGNOSIS — S00511A Abrasion of lip, initial encounter: Secondary | ICD-10-CM | POA: Diagnosis not present

## 2019-02-25 DIAGNOSIS — S80211A Abrasion, right knee, initial encounter: Secondary | ICD-10-CM

## 2019-02-25 DIAGNOSIS — S0081XA Abrasion of other part of head, initial encounter: Secondary | ICD-10-CM

## 2019-02-25 DIAGNOSIS — S80212A Abrasion, left knee, initial encounter: Secondary | ICD-10-CM | POA: Diagnosis not present

## 2019-02-25 DIAGNOSIS — S6991XA Unspecified injury of right wrist, hand and finger(s), initial encounter: Secondary | ICD-10-CM

## 2019-02-25 DIAGNOSIS — M7989 Other specified soft tissue disorders: Secondary | ICD-10-CM | POA: Diagnosis not present

## 2019-02-25 DIAGNOSIS — W11XXXA Fall on and from ladder, initial encounter: Secondary | ICD-10-CM

## 2019-02-25 DIAGNOSIS — I10 Essential (primary) hypertension: Secondary | ICD-10-CM | POA: Diagnosis not present

## 2019-02-25 DIAGNOSIS — W19XXXA Unspecified fall, initial encounter: Secondary | ICD-10-CM

## 2019-02-25 DIAGNOSIS — S0990XA Unspecified injury of head, initial encounter: Secondary | ICD-10-CM | POA: Diagnosis not present

## 2019-02-25 DIAGNOSIS — S0031XA Abrasion of nose, initial encounter: Secondary | ICD-10-CM

## 2019-02-25 MED ORDER — IBUPROFEN 600 MG PO TABS: 600.0000 mg | ORAL_TABLET | Freq: Four times a day (QID) | ORAL | 0 refills | Status: DC | PRN

## 2019-02-25 NOTE — Discharge Instructions (Addendum)
Xray negative for fracture Please wear wrist brace for support May apply neosporin/bacitracin to wound, ice lip and nose If you have repeat nose bleed apply pressure to tip, lean forward, may use afrin to stop bleeding, follow up if bleeding persisting for 15-20 min despite consistent pressure Nasal fractures often heal on their own, please follow up if developing worsening pain or bruising around eyes Follow up in emergency room for further imaging if developing worsening headache, vision changes, weakness

## 2019-02-25 NOTE — ED Triage Notes (Signed)
Patient presents to Urgent Care with complaints of slipping off a ladder while changing the smoke detector battery since this evening. Patient states he scraped his legs, right hand, and face. Small lacerations noted to nose and bottom lip, bleeding controlled, pt denies LOC or use of blood thinners.

## 2019-02-25 NOTE — ED Provider Notes (Signed)
[place]    CSN: [id number] Arrival date & time: 02/25/19  1706     History   Chief Complaint Chief Complaint  Patient presents with  . Head Laceration    HPI Angel [name] is a 68 y.o. male history of GERD, hypertension, DM type II presenting today for evaluation of facial lacerations, right wrist pain and headache after fall.  Patient states that he was changing batteries out of the smoke detector in his home and fell off of a ladder.  States that the ladder slipped behind him and he fell forward landing on his face.  He attempted to catch his fall and since has had pain in his right thumb.  He denies loss of consciousness.  Immediately had nose bleeding.  Since he has had headache, but denies changes in vision.  He has felt fatigued.  He denies weakness or difficulty speaking.  He is also having discomfort in the middle of his chest.  Denies shortness of breath.  Denies being on any anticoagulants.  HPI  Past Medical History:  Diagnosis Date  . Cancer [facility])    For surgery for colon ca  . Diabetes mellitus without complication ([place])    type 2  . GERD (gastroesophageal reflux disease)    at times  . Hypertension     Patient Active Problem List   Diagnosis Date Noted  . Incisional hernia 10/06/2017  . Colon cancer ([place]) 02/02/2017    Past Surgical History:  Procedure Laterality Date  . APPENDECTOMY    . COLONOSCOPY WITH PROPOFOL N/A 12/19/2016   Procedure: COLONOSCOPY WITH PROPOFOL;  Surgeon: Garlan Fair, MD;  Location: [facility];  Service: Endoscopy;  Laterality: N/A;  . FLEXIBLE SIGMOIDOSCOPY N/A 12/20/2016   Procedure: FLEXIBLE SIGMOIDOSCOPY;  Surgeon: Garlan Fair, MD;  Location: [facility] ENDOSCOPY;  Service: Endoscopy;  Laterality: N/A;  . INCISIONAL HERNIA REPAIR N/A 10/06/2017   Procedure: LAPAROSCOPIC INCISIONAL HERNIA REPAIR WITH MESH;  Surgeon: Leighton Ruff, MD;  Location: [facility] ORS;  Service: General;  Laterality: N/A;  . INSERTION OF MESH  N/A 10/06/2017   Procedure: INSERTION OF MESH;  Surgeon: Leighton Ruff, MD;  Location: [facility] ORS;  Service: General;  Laterality: N/A;  . LAPAROSCOPIC PARTIAL COLECTOMY N/A 02/02/2017   Procedure: LAPAROSCOPIC PARTIAL COLECTOMY;  Surgeon: Leighton Ruff, MD;  Location: [facility] OR[facility];  Service: General;  Laterality: N/A;  . UMBILICAL HERNIA REPAIR N/A 02/02/2017   Procedure: HERNIA REPAIR UMBILICAL ADULT;  Surgeon: Leighton Ruff, MD;  Location: [facility] OR[facility];  Service: General;  Laterality: N/A;       Home Medications    Prior to Admission medications   Medication Sig Start Date End Date Taking? Authorizing Provider  aspirin EC 81 MG tablet Take 81 mg by mouth daily.    [provider]  atorvastatin (LIPITOR) 10 MG tablet Take 10 mg by mouth daily. 11/22/16   [provider]  CALCIUM PO Take 1 tablet by mouth daily.    [provider]  GLUCOSAMINE PO Take 1 tablet by mouth daily as needed (when joints hurt).     [provider]  glyBURIDE (DIABETA) 5 MG tablet Take 10 mg by mouth 2 (two) times daily.  09/22/16   [provider]  ibuprofen (ADVIL,MOTRIN) 600 MG tablet Take 1 tablet (600 mg total) by mouth every 6 (six) hours as needed. 02/25/19   Ethan Kasperski C, PA-C  losartan-hydrochlorothiazide (HYZAAR) 50-12.5 MG tablet Take 1 tablet by mouth daily. 11/15/16   [provider]  meloxicam (MOBIC) 15 MG tablet TAKE ONE TABLET BY MOUTH DAILY w/food AS NEEDED FOR inflammation/pain] 10/04/18   [provider]  metFORMIN (GLUCOPHAGE) 1000 MG tablet Take 1,000 mg by mouth 2 (two) times daily with a meal.    [provider]  Multiple Vitamin (MULTIVITAMIN WITH MINERALS) TABS tablet Take 1 tablet by mouth daily. One-A-Day for Men    [provider]  omeprazole (PRILOSEC) 20 MG capsule Take 20 mg by mouth daily as needed (for acid reflux/heartburn).  11/22/16   [provider]  sildenafil (REVATIO) 20 MG tablet Take 20-100 mg by  mouth daily as needed for erectile dysfunction. 12/23/16   [provider]  TRESIBA FLEXTOUCH 100 UNIT/ML SOPN FlexTouch Pen INJECT 30 UNITS EVERY DAY 08/23/18   [provider]  VENTOLIN HFA 108 (90 Base) MCG/ACT inhaler Inhale 1-2 puffs into the lungs every 6 (six) hours as needed (for wheezing/shortness of breath).  02/13/17   [provider]    Family History History reviewed. No pertinent family history.  Social History Social History   Tobacco Use  . Smoking status: Never Smoker  . Smokeless tobacco: Never Used  . Tobacco comment: smoked 37 years ago for a short time  Substance Use Topics  . Alcohol use: Yes    Comment: occasional  . Drug use: No     Allergies   Quinine derivatives   Review of Systems Review of Systems  Constitutional: Positive for fatigue. Negative for activity change, chills, diaphoresis and fever.  HENT: Negative for congestion, ear pain, sinus pressure, sore throat, tinnitus and trouble swallowing.   Eyes: Negative for photophobia, pain and visual disturbance.  Respiratory: Negative for cough, chest tightness and shortness of breath.   Cardiovascular: Negative for chest pain and leg swelling.  Gastrointestinal: Negative for abdominal pain, blood in stool, nausea and vomiting.  Genitourinary: Negative for decreased urine volume and hematuria.  Musculoskeletal: Positive for arthralgias and myalgias. Negative for back pain, gait problem, neck pain and neck stiffness.  Skin: Positive for wound. Negative for color change.  Neurological: Positive for headaches. Negative for dizziness, syncope, facial asymmetry, speech difficulty, weakness, light-headedness and numbness.     Physical Exam Triage Vital Signs ED Triage Vitals  Enc Vitals Group     BP 02/25/19 1749 (!) 150/89     Pulse Rate 02/25/19 1749 96     Resp 02/25/19 1749 19     Temp 02/25/19 1749 98.7 F (37.1 C)     Temp Source 02/25/19 1749 Oral     SpO2 02/25/19  1749 100 %     Weight --      Height --      Head Circumference --      Peak Flow --      Pain Score 02/25/19 1748 7     Pain Loc --      Pain Edu? --      Excl. in [place]? --    No data found.  Updated Vital Signs BP (!) 150/89 (BP Location: Right Arm)   Pulse 96   Temp 98.7 F (37.1 C) (Oral)   Resp 19   SpO2 100%   Visual Acuity Right Eye Distance:   Left Eye Distance:   Bilateral Distance:    Right Eye Near:   Left Eye Near:    Bilateral Near:     Physical Exam Vitals signs and nursing note reviewed.  Constitutional:      Appearance: He is  well-developed.  HENT:     Head: Normocephalic and atraumatic.     Comments: Nontender to palpation and no crepitus palpated around orbits or mandible/TMJ    Ears:     Comments: No hemotympanum bilaterally    Nose:     Comments: Tender to palpation of nasal bridge Nasal mucosa with multiple abrasions and likely sources of nosebleed    Mouth/Throat:     Comments: Oral mucosa pink and moist, no tonsillar enlargement or exudate. Posterior pharynx patent and nonerythematous, no uvula deviation or swelling. Normal phonation.  Upper right lip with swelling and bruising Eyes:     Extraocular Movements: Extraocular movements intact.     Conjunctiva/sclera: Conjunctivae normal.     Pupils: Pupils are equal, round, and reactive to light.  Neck:     Musculoskeletal: Neck supple.  Cardiovascular:     Rate and Rhythm: Normal rate and regular rhythm.     Heart sounds: No murmur.  Pulmonary:     Effort: Pulmonary effort is normal. No respiratory distress.     Breath sounds: Normal breath sounds.     Comments: Breathing comfortably at rest, CTABL, no wheezing, rales or other adventitious sounds auscultated  No anterior chest tenderness to palpation throughout bilateral sides, mild tenderness to palpation of lower sternal area Abdominal:     Palpations: Abdomen is soft.     Tenderness: There is no abdominal tenderness.   Musculoskeletal:     Comments: Tenderness to palpation of base of right thumb and in snuffbox, full active range of motion of thumb, nontender to distal radius and ulna Radial pulse 2+  Skin:    General: Skin is warm and dry.     Comments: Multiple superficial abrasions to nasal bridge, right superior lip, bilateral knees and shins  Neurological:     General: No focal deficit present.     Mental Status: He is alert and oriented to person, place, and time. Mental status is at baseline.     Cranial Nerves: No cranial nerve deficit.     Sensory: No sensory deficit.     Motor: No weakness.     Comments: Patient A&O x3, cranial nerves II-XII grossly intact, strength at shoulders, hips and knees 5/5, equal bilaterally, patellar reflex 1+ bilaterally.  Gait without abnormality-ambulating around in room      UC Treatments / Results  Labs (all labs ordered are listed, but only abnormal results are displayed) Labs Reviewed - No data to display  EKG None  Radiology No results found.  Procedures Procedures (including critical care time)  Medications Ordered in UC Medications - No data to display  Initial Impression / Assessment and Plan / UC Course  I have reviewed the triage vital signs and the nursing notes.  Pertinent labs & imaging results that were available during my care of the patient were reviewed by me and considered in my medical decision making (see chart for details).     X-ray negative for fracture and dislocation at this time, will place in thumb spica for possible ligamentous injury versus underlying scaphoid injury.  Will have follow-up if symptoms still persisting without improvement.  Tylenol and ibuprofen in the meantime along with thumb spica.  Abrasions relatively superficial, do not warrant suturing at this time.  Monitor for gradual resolution.  No neuro deficits at this time, but did discuss with patient given height of fall if he has any worsening headache or  vision changes for him to go to emergency room for  scan.  Stable at discharge.  Possible nasal fracture, ice, anti-inflammatories.  Discussed recommendations for any further nosebleeds.  Continue to monitor,Discussed strict return precautions. Patient verbalized understanding and is agreeable with plan.  Final Clinical Impressions(s) / UC Diagnoses   Final diagnoses:  None     Discharge Instructions     Xray negative for fracture Please wear wrist brace for support May apply neosporin/bacitracin to wound, ice lip and nose If you have repeat nose bleed apply pressure to tip, lean forward, may use afrin to stop bleeding, follow up if bleeding persisting for 15-20 min despite consistent pressure Nasal fractures often heal on their own, please follow up if developing worsening pain or bruising around eyes Follow up in emergency room for further imaging if developing worsening headache, vision changes, weakness    ED Prescriptions    Medication Sig Dispense Auth. Provider   ibuprofen (ADVIL,MOTRIN) 600 MG tablet Take 1 tablet (600 mg total) by mouth every 6 (six) hours as needed. 30 tablet Martine Trageser, [place] C, PA-C     Controlled Substance Prescriptions [place] Controlled Substance Registry consulted? Not Applicable   Janith Lima, [place] 02/25/19 1948

## 2019-02-25 NOTE — ED Notes (Signed)
Patient verbalizes understanding of discharge instructions. Opportunity for questioning and answers were provided. Patient discharged from [facility] by RN.  

## 2019-03-27 DIAGNOSIS — E039 Hypothyroidism, unspecified: Secondary | ICD-10-CM | POA: Diagnosis not present

## 2019-03-27 DIAGNOSIS — I10 Essential (primary) hypertension: Secondary | ICD-10-CM | POA: Diagnosis not present

## 2019-03-27 DIAGNOSIS — E7800 Pure hypercholesterolemia, unspecified: Secondary | ICD-10-CM | POA: Diagnosis not present

## 2019-03-27 DIAGNOSIS — E1169 Type 2 diabetes mellitus with other specified complication: Secondary | ICD-10-CM | POA: Diagnosis not present

## 2019-03-27 DIAGNOSIS — C189 Malignant neoplasm of colon, unspecified: Secondary | ICD-10-CM | POA: Diagnosis not present

## 2019-03-27 DIAGNOSIS — Z794 Long term (current) use of insulin: Secondary | ICD-10-CM | POA: Diagnosis not present

## 2019-03-27 DIAGNOSIS — E1165 Type 2 diabetes mellitus with hyperglycemia: Secondary | ICD-10-CM | POA: Diagnosis not present

## 2019-04-03 DIAGNOSIS — E1165 Type 2 diabetes mellitus with hyperglycemia: Secondary | ICD-10-CM | POA: Diagnosis not present

## 2019-04-16 ENCOUNTER — Telehealth: Payer: Self-pay | Admitting: Oncology

## 2019-04-16 NOTE — Telephone Encounter (Signed)
Per [facility] reschedule list patient to keep lab 5/26 and f/u converted to webex. Confirmed with patient.

## 2019-04-18 ENCOUNTER — Telehealth: Payer: Self-pay | Admitting: Oncology

## 2019-04-18 NOTE — Telephone Encounter (Signed)
Called patient regarding upcoming Webex appointment, patient would prefer this to be a telephone visit.

## 2019-04-23 ENCOUNTER — Inpatient Hospital Stay (HOSPITAL_BASED_OUTPATIENT_CLINIC_OR_DEPARTMENT_OTHER): Payer: Managed Care, Other (non HMO) | Admitting: Oncology

## 2019-04-23 ENCOUNTER — Inpatient Hospital Stay: Payer: Managed Care, Other (non HMO) | Attending: Oncology

## 2019-04-23 ENCOUNTER — Other Ambulatory Visit: Payer: Self-pay

## 2019-04-23 DIAGNOSIS — I10 Essential (primary) hypertension: Secondary | ICD-10-CM | POA: Diagnosis not present

## 2019-04-23 DIAGNOSIS — C187 Malignant neoplasm of sigmoid colon: Secondary | ICD-10-CM | POA: Diagnosis not present

## 2019-04-23 DIAGNOSIS — D701 Agranulocytosis secondary to cancer chemotherapy: Secondary | ICD-10-CM | POA: Insufficient documentation

## 2019-04-23 DIAGNOSIS — R198 Other specified symptoms and signs involving the digestive system and abdomen: Secondary | ICD-10-CM | POA: Diagnosis not present

## 2019-04-23 DIAGNOSIS — Z800 Family history of malignant neoplasm of digestive organs: Secondary | ICD-10-CM | POA: Diagnosis not present

## 2019-04-23 DIAGNOSIS — E119 Type 2 diabetes mellitus without complications: Secondary | ICD-10-CM | POA: Insufficient documentation

## 2019-04-23 DIAGNOSIS — Z86010 Personal history of colonic polyps: Secondary | ICD-10-CM | POA: Diagnosis not present

## 2019-04-23 DIAGNOSIS — T451X5A Adverse effect of antineoplastic and immunosuppressive drugs, initial encounter: Secondary | ICD-10-CM | POA: Insufficient documentation

## 2019-04-23 LAB — CEA (IN HOUSE-CHCC): CEA (CHCC-In House): 1.64 ng/mL (ref 0.00–5.00)

## 2019-04-23 NOTE — Progress Notes (Signed)
  [place] OFFICE VISIT PROGRESS NOTE  I connected with Angel [name] on 04/23/19 at approximately 1 PM EDT by telephone and verified that I am speaking with the correct person using two identifiers.     Patient's location: Home Provider's location: Office   Diagnosis: Colon cancer  INTERVAL HISTORY:   Angel [name] was seen today for a telephone visit in lieu of an in person visit.  This is due to the [place] pandemic.  He consented to a telephone visit. Angel [name] reports feeling well.  Good appetite.  No difficulty with bowel function.  He has not undergone a surveillance colonoscopy since the colon cancer surgery. He fell from a ladder last month and fractured his nose, a tongue, and a rib.   Objective:    Lab Results:  Lab Results  Component Value Date   WBC 5.5 10/03/2017   HGB 14.5 10/03/2017   HCT 45.0 10/03/2017   MCV 76.1 (L) 10/03/2017   PLT 156 10/03/2017   NEUTROABS 3.2 05/26/2017    Medications: I have reviewed the patient's current medications.  Assessment/Plan: 1. Colon cancer, sigmoid, stage IIIa (T1,N1), status post a partial colectomy 02/02/2017 ? 1/26 lymph nodes positive ? MSI-stable, no loss of mismatch repair protein expression ? Staging CTs 12/29/2016-small indeterminate left lung nodules ? Normal preoperative CEA ? Cycle 1 CAPOX 03/03/2017 ? Cycle 2 CAPOX 03/24/2017 ? Cycle 3 CAPOX 04/14/2017 ? Cycle 4 CAPOX 05/05/2017 ? CTs 11/06/2018- negative for recurrent disease, small left lung nodule stable from 2007-consider benign  2. Indeterminate left lung nodules on a chest CT 12/29/2016  3. Chronic red cell microcytosis  4. Diabetes  5. Hypertension  6. Family history of colon cancer  7. Multiple polyps noted on the colonoscopy 12/19/2016  8.Neutropenia secondary to chemotherapy    Disposition: Angel [name] was diagnosed with stage III in March 2018.  He  is in clinical remission.  I will refer him to Dr. Watt Climes for a surveillance colonoscopy.  Angel [name] will return for an office visit, CEA, and restaging CTs in 6 months.   I discussed the assessment and treatment plan with the patient. The patient was provided an opportunity to ask questions and all were answered. The patient agreed with the plan and demonstrated an understanding of the instructions.   The patient was advised to call back or seek an in-person evaluation if the symptoms worsen or if the condition fails to improve as anticipated.  I provided 15 minutes of telephone, chart review, and documentation time during this encounter, and > 50% was spent counseling as documented under my assessment & plan.  Betsy Coder ANP/GNP-BC   04/23/2019 1:03 PM

## 2019-04-24 ENCOUNTER — Telehealth: Payer: Self-pay | Admitting: Oncology

## 2019-04-24 NOTE — Telephone Encounter (Signed)
Scheduled appt per 5/26 los. ° °A calendar will be mailed out. °

## 2019-05-02 ENCOUNTER — Telehealth: Payer: Self-pay | Admitting: *Deleted

## 2019-05-02 NOTE — Telephone Encounter (Signed)
Confirmed w/patient that Dr. Watt Climes is seeing him on 05/09/19 and will schedule procedure (colonoscopy) at that time.

## 2019-05-09 DIAGNOSIS — Z86010 Personal history of colonic polyps: Secondary | ICD-10-CM | POA: Diagnosis not present

## 2019-05-09 DIAGNOSIS — C189 Malignant neoplasm of colon, unspecified: Secondary | ICD-10-CM | POA: Diagnosis not present

## 2019-05-29 DIAGNOSIS — Z980 Intestinal bypass and anastomosis status: Secondary | ICD-10-CM | POA: Diagnosis not present

## 2019-05-29 DIAGNOSIS — Z85038 Personal history of other malignant neoplasm of large intestine: Secondary | ICD-10-CM | POA: Diagnosis not present

## 2019-05-29 DIAGNOSIS — Z86010 Personal history of colonic polyps: Secondary | ICD-10-CM | POA: Diagnosis not present

## 2019-05-29 DIAGNOSIS — K5730 Diverticulosis of large intestine without perforation or abscess without bleeding: Secondary | ICD-10-CM | POA: Diagnosis not present

## 2019-05-29 HISTORY — PX: COLONOSCOPY WITH PROPOFOL: SHX5780

## 2019-07-01 DIAGNOSIS — I10 Essential (primary) hypertension: Secondary | ICD-10-CM | POA: Diagnosis not present

## 2019-07-01 DIAGNOSIS — E1165 Type 2 diabetes mellitus with hyperglycemia: Secondary | ICD-10-CM | POA: Diagnosis not present

## 2019-07-01 DIAGNOSIS — E039 Hypothyroidism, unspecified: Secondary | ICD-10-CM | POA: Diagnosis not present

## 2019-07-01 DIAGNOSIS — E7800 Pure hypercholesterolemia, unspecified: Secondary | ICD-10-CM | POA: Diagnosis not present

## 2019-07-01 DIAGNOSIS — C189 Malignant neoplasm of colon, unspecified: Secondary | ICD-10-CM | POA: Diagnosis not present

## 2019-07-02 ENCOUNTER — Other Ambulatory Visit: Payer: Self-pay | Admitting: Family Medicine

## 2019-07-02 ENCOUNTER — Ambulatory Visit
Admission: RE | Admit: 2019-07-02 | Discharge: 2019-07-02 | Disposition: A | Discharge status: Home / Self Care | Payer: Managed Care, Other (non HMO) | Source: Ambulatory Visit | Attending: Family Medicine | Admitting: Family Medicine

## 2019-07-02 DIAGNOSIS — R0602 Shortness of breath: Secondary | ICD-10-CM

## 2019-07-02 DIAGNOSIS — R079 Chest pain, unspecified: Secondary | ICD-10-CM

## 2019-10-22 DIAGNOSIS — Z125 Encounter for screening for malignant neoplasm of prostate: Secondary | ICD-10-CM | POA: Diagnosis not present

## 2019-10-22 DIAGNOSIS — C189 Malignant neoplasm of colon, unspecified: Secondary | ICD-10-CM | POA: Diagnosis not present

## 2019-10-22 DIAGNOSIS — Z86010 Personal history of colonic polyps: Secondary | ICD-10-CM | POA: Diagnosis not present

## 2019-10-22 DIAGNOSIS — Z0000 Encounter for general adult medical examination without abnormal findings: Secondary | ICD-10-CM | POA: Diagnosis not present

## 2019-10-22 DIAGNOSIS — E039 Hypothyroidism, unspecified: Secondary | ICD-10-CM | POA: Diagnosis not present

## 2019-10-22 DIAGNOSIS — I700 Atherosclerosis of aorta: Secondary | ICD-10-CM | POA: Diagnosis not present

## 2019-10-22 DIAGNOSIS — Q632 Ectopic kidney: Secondary | ICD-10-CM | POA: Diagnosis not present

## 2019-10-22 DIAGNOSIS — E7800 Pure hypercholesterolemia, unspecified: Secondary | ICD-10-CM | POA: Diagnosis not present

## 2019-10-22 DIAGNOSIS — E1169 Type 2 diabetes mellitus with other specified complication: Secondary | ICD-10-CM | POA: Diagnosis not present

## 2019-10-22 DIAGNOSIS — I10 Essential (primary) hypertension: Secondary | ICD-10-CM | POA: Diagnosis not present

## 2019-10-25 ENCOUNTER — Inpatient Hospital Stay: Payer: Managed Care, Other (non HMO) | Attending: Oncology

## 2019-10-25 ENCOUNTER — Telehealth: Payer: Self-pay | Admitting: Oncology

## 2019-10-25 ENCOUNTER — Encounter (HOSPITAL_COMMUNITY): Payer: Self-pay

## 2019-10-25 ENCOUNTER — Ambulatory Visit (HOSPITAL_COMMUNITY)
Admission: RE | Admit: 2019-10-25 | Discharge: 2019-10-25 | Disposition: A | Discharge status: Home / Self Care | Payer: Managed Care, Other (non HMO) | Source: Ambulatory Visit | Attending: Oncology | Admitting: Oncology

## 2019-10-25 ENCOUNTER — Other Ambulatory Visit: Payer: Self-pay | Admitting: *Deleted

## 2019-10-25 ENCOUNTER — Other Ambulatory Visit: Payer: Self-pay

## 2019-10-25 DIAGNOSIS — D701 Agranulocytosis secondary to cancer chemotherapy: Secondary | ICD-10-CM | POA: Insufficient documentation

## 2019-10-25 DIAGNOSIS — Z800 Family history of malignant neoplasm of digestive organs: Secondary | ICD-10-CM | POA: Insufficient documentation

## 2019-10-25 DIAGNOSIS — K4020 Bilateral inguinal hernia, without obstruction or gangrene, not specified as recurrent: Secondary | ICD-10-CM | POA: Insufficient documentation

## 2019-10-25 DIAGNOSIS — T451X5A Adverse effect of antineoplastic and immunosuppressive drugs, initial encounter: Secondary | ICD-10-CM | POA: Insufficient documentation

## 2019-10-25 DIAGNOSIS — C187 Malignant neoplasm of sigmoid colon: Secondary | ICD-10-CM | POA: Insufficient documentation

## 2019-10-25 DIAGNOSIS — E1140 Type 2 diabetes mellitus with diabetic neuropathy, unspecified: Secondary | ICD-10-CM | POA: Insufficient documentation

## 2019-10-25 DIAGNOSIS — Z79899 Other long term (current) drug therapy: Secondary | ICD-10-CM | POA: Insufficient documentation

## 2019-10-25 DIAGNOSIS — Q632 Ectopic kidney: Secondary | ICD-10-CM | POA: Insufficient documentation

## 2019-10-25 DIAGNOSIS — R718 Other abnormality of red blood cells: Secondary | ICD-10-CM | POA: Insufficient documentation

## 2019-10-25 DIAGNOSIS — I10 Essential (primary) hypertension: Secondary | ICD-10-CM | POA: Insufficient documentation

## 2019-10-25 LAB — BASIC METABOLIC PANEL - CANCER CENTER ONLY
Anion gap: 14 (ref 5–15)
BUN: 16 mg/dL (ref 8–23)
CO2: 25 mmol/L (ref 22–32)
Calcium: 9.4 mg/dL (ref 8.9–10.3)
Chloride: 100 mmol/L (ref 98–111)
Creatinine: 1.3 mg/dL — ABNORMAL HIGH (ref 0.61–1.24)
GFR, Est AFR Am: >60 mL/min (ref >60)
GFR, Estimated: 57 mL/min — ABNORMAL LOW (ref >60)
Glucose, Bld: 130 mg/dL — ABNORMAL HIGH (ref 70–99)
Potassium: 3.8 mmol/L (ref 3.5–5.1)
Sodium: 139 mmol/L (ref 135–145)

## 2019-10-25 LAB — CEA (IN HOUSE-CHCC): CEA (CHCC-In House): 1.3 ng/mL (ref 0.00–5.00)

## 2019-10-25 MED ORDER — SODIUM CHLORIDE (PF) 0.9 % IJ SOLN
INTRAMUSCULAR | Status: AC
  Filled 2019-10-25: qty 50

## 2019-10-25 MED ORDER — IOHEXOL 300 MG/ML  SOLN
100.0000 mL | Freq: Once | INTRAMUSCULAR | Status: AC | PRN
  Administered 2019-10-25: 100 mL via INTRAVENOUS

## 2019-10-25 NOTE — Progress Notes (Signed)
Per Dr. Benay Spice: Needs repeat BMP on 11/30. Creatinine is mildly elevated

## 2019-10-25 NOTE — Telephone Encounter (Signed)
Added lab 11/30 prior to f/u. Confirmed with patient.

## 2019-10-28 ENCOUNTER — Other Ambulatory Visit: Payer: Self-pay

## 2019-10-28 ENCOUNTER — Encounter: Payer: Self-pay | Admitting: *Deleted

## 2019-10-28 ENCOUNTER — Inpatient Hospital Stay (HOSPITAL_BASED_OUTPATIENT_CLINIC_OR_DEPARTMENT_OTHER): Payer: Managed Care, Other (non HMO) | Admitting: Oncology

## 2019-10-28 ENCOUNTER — Inpatient Hospital Stay: Payer: Managed Care, Other (non HMO)

## 2019-10-28 VITALS — BP 145/77 | HR 85 | Temp 98.5°F | Resp 17 | Ht 69.5 in | Wt 269.9 lb

## 2019-10-28 DIAGNOSIS — K4020 Bilateral inguinal hernia, without obstruction or gangrene, not specified as recurrent: Secondary | ICD-10-CM | POA: Diagnosis not present

## 2019-10-28 DIAGNOSIS — C187 Malignant neoplasm of sigmoid colon: Secondary | ICD-10-CM

## 2019-10-28 DIAGNOSIS — E1140 Type 2 diabetes mellitus with diabetic neuropathy, unspecified: Secondary | ICD-10-CM | POA: Diagnosis not present

## 2019-10-28 DIAGNOSIS — Q632 Ectopic kidney: Secondary | ICD-10-CM | POA: Diagnosis not present

## 2019-10-28 DIAGNOSIS — T451X5A Adverse effect of antineoplastic and immunosuppressive drugs, initial encounter: Secondary | ICD-10-CM | POA: Diagnosis not present

## 2019-10-28 DIAGNOSIS — D701 Agranulocytosis secondary to cancer chemotherapy: Secondary | ICD-10-CM | POA: Diagnosis not present

## 2019-10-28 DIAGNOSIS — I10 Essential (primary) hypertension: Secondary | ICD-10-CM | POA: Diagnosis not present

## 2019-10-28 DIAGNOSIS — Z800 Family history of malignant neoplasm of digestive organs: Secondary | ICD-10-CM | POA: Diagnosis not present

## 2019-10-28 DIAGNOSIS — R718 Other abnormality of red blood cells: Secondary | ICD-10-CM | POA: Diagnosis not present

## 2019-10-28 DIAGNOSIS — Z79899 Other long term (current) drug therapy: Secondary | ICD-10-CM | POA: Diagnosis not present

## 2019-10-28 LAB — BASIC METABOLIC PANEL - CANCER CENTER ONLY
Anion gap: 13 (ref 5–15)
BUN: 12 mg/dL (ref 8–23)
CO2: 26 mmol/L (ref 22–32)
Calcium: 9.4 mg/dL (ref 8.9–10.3)
Chloride: 99 mmol/L (ref 98–111)
Creatinine: 1.45 mg/dL — ABNORMAL HIGH (ref 0.61–1.24)
GFR, Est AFR Am: 58 mL/min — ABNORMAL LOW (ref >60)
GFR, Estimated: 50 mL/min — ABNORMAL LOW (ref >60)
Glucose, Bld: 60 mg/dL — ABNORMAL LOW (ref 70–99)
Potassium: 3.2 mmol/L — ABNORMAL LOW (ref 3.5–5.1)
Sodium: 138 mmol/L (ref 135–145)

## 2019-10-28 NOTE — Progress Notes (Signed)
CMP faxed to Dr. Delfina Redwood with note that patient had IV contrast for CT scan on 11/27 and is also on losartan/hctz and meloxicam. Fax [phone]

## 2019-10-28 NOTE — Progress Notes (Signed)
[place] OFFICE PROGRESS NOTE   Diagnosis: Colon cancer  INTERVAL HISTORY:   Angel [name] returns as scheduled.  He feels well.  Good appetite.  No complaint.  He reports neuropathy symptoms resolved within a month of completing chemotherapy.  He reports undergoing a colonoscopy this year.  Objective:  Vital signs in last 24 hours:  Blood pressure (!) 145/77, pulse 85, temperature 98.5 F (36.9 C), temperature source Temporal, resp. rate 17, height 5' 9.5" (1.765 m), weight 269 lb 14.4 oz (122.4 kg), SpO2 96 %.   Physical examination not performed secondary to distancing with the Covid pandemic  Lab Results:  Lab Results  Component Value Date   WBC 5.5 10/03/2017   HGB 14.5 10/03/2017   HCT 45.0 10/03/2017   MCV 76.1 (L) 10/03/2017   PLT 156 10/03/2017   NEUTROABS 3.2 05/26/2017    CMP  Lab Results  Component Value Date   NA 138 10/28/2019   K 3.2 (L) 10/28/2019   CL 99 10/28/2019   CO2 26 10/28/2019   GLUCOSE 60 (L) 10/28/2019   BUN 12 10/28/2019   CREATININE 1.45 (H) 10/28/2019   CALCIUM 9.4 10/28/2019   PROT 7.7 05/26/2017   ALBUMIN 4.0 05/26/2017   AST 65 (H) 05/26/2017   ALT 84 (H) 05/26/2017   ALKPHOS 99 05/26/2017   BILITOT 0.57 05/26/2017   GFRNONAA 50 (L) 10/28/2019   GFRAA 58 (L) 10/28/2019    Lab Results  Component Value Date   CEA1 1.30 10/25/2019    No results found for: INR  Imaging:  Ct Chest W Contrast  Result Date: 10/25/2019 CLINICAL DATA:  Patient with history of colon cancer. Follow-up exam. EXAM: CT CHEST, ABDOMEN, AND PELVIS WITH CONTRAST TECHNIQUE: Multidetector CT imaging of the chest, abdomen and pelvis was performed following the standard protocol during bolus administration of intravenous contrast. CONTRAST:  132m OMNIPAQUE IOHEXOL 300 MG/ML  SOLN COMPARISON:  CT CAP 11/06/2018 FINDINGS: CT CHEST FINDINGS Cardiovascular: Normal heart size. No pericardial effusion. Aorta and main pulmonary artery normal in  caliber. Thoracic aortic vascular calcifications. Mediastinum/Nodes: No enlarged axillary, mediastinal hilar lymphadenopathy. Normal appearance of the esophagus. Lungs/Pleura: Central airways are patent. Dependent atelectasis within the lower lobes bilaterally. No large area pulmonary consolidation. No pleural effusion or pneumothorax. No enlarging pulmonary nodules. Unchanged 3-5 mm nodules within the left lung, most compatible benign process given stability over time. Reference subpleural left lower lobe nodule measures 5 mm (image 85; series 6). Musculoskeletal: Thoracic spine degenerative changes. No aggressive or acute appearing osseous lesions. CT ABDOMEN PELVIS FINDINGS Hepatobiliary: The liver is normal in size and contour. No focal hepatic lesion is identified. Gallbladder is unremarkable. No intrahepatic or extrahepatic biliary ductal dilatation. Pancreas: Unremarkable Spleen: Unremarkable Adrenals/Urinary Tract: Normal adrenal glands. Redemonstrated malrotated kidneys bilaterally with pelvic location of the left kidney. No hydronephrosis. Stomach/Bowel: Oral contrast material to the level of the rectum. No evidence for bowel obstruction. No free fluid or free intraperitoneal air. Stable postsurgical changes compatible with partial descending colectomy. Vascular/Lymphatic: Normal caliber abdominal aorta. Peripheral calcified atherosclerotic plaque. Interval increase in size of 1.0 cm small bowel mesenteric node (image 72; series 2), previously 0.7 cm. No retroperitoneal lymphadenopathy. Reproductive: Heterogeneous prostate. Other: Small fat containing bilateral inguinal hernias. Postsurgical changes anterior abdominal wall. Musculoskeletal: Bilateral hip joint and lower lumbar spine degenerative changes. No aggressive or acute appearing osseous lesions. IMPRESSION: Mild interval increase in size of now 1 cm small bowel mesenteric lymph node, nonspecific. Otherwise no evidence for  recurrent or metastatic  disease within the chest, abdomen or pelvis. Electronically Signed   By: Lovey Newcomer M.D.   On: 10/25/2019 10:33   Ct Abdomen Pelvis W Contrast  Result Date: 10/25/2019 CLINICAL DATA:  Patient with history of colon cancer. Follow-up exam. EXAM: CT CHEST, ABDOMEN, AND PELVIS WITH CONTRAST TECHNIQUE: Multidetector CT imaging of the chest, abdomen and pelvis was performed following the standard protocol during bolus administration of intravenous contrast. CONTRAST:  155m OMNIPAQUE IOHEXOL 300 MG/ML  SOLN COMPARISON:  CT CAP 11/06/2018 FINDINGS: CT CHEST FINDINGS Cardiovascular: Normal heart size. No pericardial effusion. Aorta and main pulmonary artery normal in caliber. Thoracic aortic vascular calcifications. Mediastinum/Nodes: No enlarged axillary, mediastinal hilar lymphadenopathy. Normal appearance of the esophagus. Lungs/Pleura: Central airways are patent. Dependent atelectasis within the lower lobes bilaterally. No large area pulmonary consolidation. No pleural effusion or pneumothorax. No enlarging pulmonary nodules. Unchanged 3-5 mm nodules within the left lung, most compatible benign process given stability over time. Reference subpleural left lower lobe nodule measures 5 mm (image 85; series 6). Musculoskeletal: Thoracic spine degenerative changes. No aggressive or acute appearing osseous lesions. CT ABDOMEN PELVIS FINDINGS Hepatobiliary: The liver is normal in size and contour. No focal hepatic lesion is identified. Gallbladder is unremarkable. No intrahepatic or extrahepatic biliary ductal dilatation. Pancreas: Unremarkable Spleen: Unremarkable Adrenals/Urinary Tract: Normal adrenal glands. Redemonstrated malrotated kidneys bilaterally with pelvic location of the left kidney. No hydronephrosis. Stomach/Bowel: Oral contrast material to the level of the rectum. No evidence for bowel obstruction. No free fluid or free intraperitoneal air. Stable postsurgical changes compatible with partial descending  colectomy. Vascular/Lymphatic: Normal caliber abdominal aorta. Peripheral calcified atherosclerotic plaque. Interval increase in size of 1.0 cm small bowel mesenteric node (image 72; series 2), previously 0.7 cm. No retroperitoneal lymphadenopathy. Reproductive: Heterogeneous prostate. Other: Small fat containing bilateral inguinal hernias. Postsurgical changes anterior abdominal wall. Musculoskeletal: Bilateral hip joint and lower lumbar spine degenerative changes. No aggressive or acute appearing osseous lesions. IMPRESSION: Mild interval increase in size of now 1 cm small bowel mesenteric lymph node, nonspecific. Otherwise no evidence for recurrent or metastatic disease within the chest, abdomen or pelvis. Electronically Signed   By: DLovey NewcomerM.D.   On: 10/25/2019 10:33    Medications: I have reviewed the patient's current medications.   Assessment/Plan: 1. Colon cancer, sigmoid, stage IIIa (T1,N1), status post a partial colectomy 02/02/2017 ? 1/26 lymph nodes positive ? MSI-stable, no loss of mismatch repair protein expression ? Staging CTs 12/29/2016-small indeterminate left lung nodules ? Normal preoperative CEA ? Cycle 1 CAPOX 03/03/2017 ? Cycle 2 CAPOX 03/24/2017 ? Cycle 3 CAPOX 04/14/2017 ? Cycle 4 CAPOX 05/05/2017 ? CTs 11/06/2018- negative for recurrent disease, small left lung nodule stable from 2007-consider benign  ? CTs 10/25/2019-mild increase in a 1 cm small bowel mesenteric lymph node, no evidence of recurrent disease  2. Indeterminate left lung nodules on a chest CT 12/29/2016, stable lung nodules on CT 10/25/2019  3. Chronic red cell microcytosis  4. Diabetes  5. Hypertension  6. Family history of colon cancer  7. Multiple polyps noted on the colonoscopy 12/19/2016  8.Neutropenia secondary to chemotherapy    Disposition: Mr. WMulveyis in clinical remission from colon cancer.  He will return for an office visit and CEA in 6 months.  I  reviewed the CT images with Mr. WDeliaand his wife.  The small mesenteric lymph node was present on the last CT and is likely a benign finding.  He will be scheduled  for a CT in 1 year.  We will follow-up on the colonoscopy from this year.  The creatinine is mildly elevated.  I will forward this result to Dr. Delfina Redwood.  He is taking losartan/HCTZ and reports using meloxicam daily.  Betsy Coder, MD  10/28/2019  12:39 PM

## 2019-10-29 ENCOUNTER — Telehealth: Payer: Self-pay | Admitting: Oncology

## 2019-10-29 NOTE — Telephone Encounter (Signed)
Scheduled per los. Called and left msg. Mailed printout  °

## 2020-02-20 DIAGNOSIS — M25562 Pain in left knee: Secondary | ICD-10-CM | POA: Diagnosis not present

## 2020-04-28 ENCOUNTER — Inpatient Hospital Stay: Payer: Managed Care, Other (non HMO)

## 2020-04-28 ENCOUNTER — Other Ambulatory Visit: Payer: Self-pay

## 2020-05-08 ENCOUNTER — Telehealth: Payer: Self-pay

## 2020-05-08 ENCOUNTER — Inpatient Hospital Stay (HOSPITAL_BASED_OUTPATIENT_CLINIC_OR_DEPARTMENT_OTHER): Payer: Managed Care, Other (non HMO) | Admitting: Oncology

## 2020-05-08 ENCOUNTER — Other Ambulatory Visit: Payer: Self-pay

## 2020-05-08 ENCOUNTER — Inpatient Hospital Stay: Payer: Managed Care, Other (non HMO) | Attending: Oncology

## 2020-05-08 VITALS — BP 121/76 | HR 80 | Temp 98.1°F | Resp 18 | Ht 69.5 in | Wt 257.3 lb

## 2020-05-08 DIAGNOSIS — M25569 Pain in unspecified knee: Secondary | ICD-10-CM | POA: Insufficient documentation

## 2020-05-08 DIAGNOSIS — Z86010 Personal history of colonic polyps: Secondary | ICD-10-CM | POA: Insufficient documentation

## 2020-05-08 DIAGNOSIS — M7989 Other specified soft tissue disorders: Secondary | ICD-10-CM | POA: Diagnosis not present

## 2020-05-08 DIAGNOSIS — R718 Other abnormality of red blood cells: Secondary | ICD-10-CM | POA: Insufficient documentation

## 2020-05-08 DIAGNOSIS — R634 Abnormal weight loss: Secondary | ICD-10-CM | POA: Diagnosis not present

## 2020-05-08 DIAGNOSIS — Z800 Family history of malignant neoplasm of digestive organs: Secondary | ICD-10-CM | POA: Diagnosis not present

## 2020-05-08 DIAGNOSIS — C187 Malignant neoplasm of sigmoid colon: Secondary | ICD-10-CM | POA: Diagnosis present

## 2020-05-08 DIAGNOSIS — D701 Agranulocytosis secondary to cancer chemotherapy: Secondary | ICD-10-CM | POA: Insufficient documentation

## 2020-05-08 DIAGNOSIS — Z79899 Other long term (current) drug therapy: Secondary | ICD-10-CM | POA: Insufficient documentation

## 2020-05-08 DIAGNOSIS — I10 Essential (primary) hypertension: Secondary | ICD-10-CM | POA: Diagnosis not present

## 2020-05-08 DIAGNOSIS — T451X5A Adverse effect of antineoplastic and immunosuppressive drugs, initial encounter: Secondary | ICD-10-CM | POA: Diagnosis not present

## 2020-05-08 DIAGNOSIS — E119 Type 2 diabetes mellitus without complications: Secondary | ICD-10-CM | POA: Insufficient documentation

## 2020-05-08 LAB — BASIC METABOLIC PANEL - CANCER CENTER ONLY
Anion gap: 13 (ref 5–15)
BUN: 12 mg/dL (ref 8–23)
CO2: 27 mmol/L (ref 22–32)
Calcium: 9.7 mg/dL (ref 8.9–10.3)
Chloride: 98 mmol/L (ref 98–111)
Creatinine: 1.16 mg/dL (ref 0.61–1.24)
GFR, Est AFR Am: >60 mL/min (ref >60)
GFR, Estimated: >60 mL/min (ref >60)
Glucose, Bld: 119 mg/dL — ABNORMAL HIGH (ref 70–99)
Potassium: 3.8 mmol/L (ref 3.5–5.1)
Sodium: 138 mmol/L (ref 135–145)

## 2020-05-08 LAB — CEA (IN HOUSE-CHCC): CEA (CHCC-In House): 1.45 ng/mL (ref 0.00–5.00)

## 2020-05-08 NOTE — Progress Notes (Signed)
   Angel [name]   Diagnosis: Colon cancer  INTERVAL HISTORY:   Angel [name] returns as scheduled.  He feels well.  Good appetite.  No difficulty with bowel function.  He reports weight loss after switching diabetes medication. He had surgery on the left knee approximately 7 weeks ago.  Reports knee pain has improved.  He has mild swelling of the left lower leg, no associated pain.  Objective:  Vital signs in last 24 hours:  Blood pressure 121/76, pulse 80, temperature 98.1 F (36.7 C), temperature source Temporal, resp. rate 18, height 5' 9.5" (1.765 m), weight 257 lb 4.8 oz (116.7 kg), SpO2 100 %.    Lymphatics: No cervical, supraclavicular, axillary, or inguinal nodes Resp: Lungs clear bilaterally Cardio: Regular rate and rhythm GI: No hepatosplenomegaly, no mass, nontender Vascular: No leg edema, the left lower leg is slightly larger than the right side    Lab Results:  Lab Results  Component Value Date   WBC 5.5 10/03/2017   HGB 14.5 10/03/2017   HCT 45.0 10/03/2017   MCV 76.1 (L) 10/03/2017   PLT 156 10/03/2017   NEUTROABS 3.2 05/26/2017    CMP  Lab Results  Component Value Date   NA 138 10/28/2019   K 3.2 (L) 10/28/2019   CL 99 10/28/2019   CO2 26 10/28/2019   GLUCOSE 60 (L) 10/28/2019   BUN 12 10/28/2019   CREATININE 1.45 (H) 10/28/2019   CALCIUM 9.4 10/28/2019   PROT 7.7 05/26/2017   ALBUMIN 4.0 05/26/2017   AST 65 (H) 05/26/2017   ALT 84 (H) 05/26/2017   ALKPHOS 99 05/26/2017   BILITOT 0.57 05/26/2017   GFRNONAA 50 (L) 10/28/2019   GFRAA 58 (L) 10/28/2019    Lab Results  Component Value Date   CEA1 1.30 10/25/2019     Medications: I have reviewed the patient's current medications.   Assessment/Plan:  1. Colon cancer, sigmoid, stage IIIa (T1,N1), status post a partial colectomy 02/02/2017 ? 1/26 lymph nodes positive ? MSI-stable, no loss of mismatch repair protein expression ? Staging CTs  12/29/2016-small indeterminate left lung nodules ? Normal preoperative CEA ? Cycle 1 CAPOX 03/03/2017 ? Cycle 2 CAPOX 03/24/2017 ? Cycle 3 CAPOX 04/14/2017 ? Cycle 4 CAPOX 05/05/2017 ? CTs 11/06/2018- negative for recurrent disease, small left lung nodule stable from 2007-consider benign ? Colonoscopy 05/29/2019-negative ? CTs 10/25/2019-mild increase in a 1 cm small bowel mesenteric lymph node, no evidence of recurrent disease  2.  Indeterminate left lung nodules on a chest CT 12/29/2016, stable lung nodules on CT 10/25/2019  3. Chronic red cell microcytosis  4. Diabetes  5. Hypertension  6. Family history of colon cancer  7. Multiple polyps noted on the colonoscopy 12/19/2016  8.Neutropenia secondary to chemotherapy     Disposition: Angel [name] is in clinical remission from colon cancer.  We will follow up on the CEA from today.  He will return for an office visit and restaging CTs in November.  We will check his creatinine on the day of the CT scan.  He will be due for colonoscopy in 2023.  Betsy Coder, MD  05/08/2020  8:42 AM

## 2020-05-08 NOTE — Telephone Encounter (Signed)
TC to pt per Dr Benay Spice to let him know that his CEA and creatinine are normal, follow-up as scheduled. Patient verbalized understanding.

## 2020-05-11 ENCOUNTER — Telehealth: Payer: Self-pay | Admitting: Oncology

## 2020-05-11 NOTE — Telephone Encounter (Signed)
Scheduled per los. Called and left msg. Mailed printout  °

## 2020-10-02 ENCOUNTER — Telehealth: Payer: Self-pay | Admitting: *Deleted

## 2020-10-02 NOTE — Telephone Encounter (Signed)
Notified patient that per his insurance, had to move his CT scan from [facility] to [place] at [facility] on 10/20/20 at 0950 with arrival 20 minutes sooner. Drink oral contrast at 0750 and 0850 and NPO 4 hours prior. He already has his oral contrast at home. He agrees to lab on 11/22 at 3pm. Will call him with OV once MD reviews schedule.

## 2020-10-05 ENCOUNTER — Ambulatory Visit (HOSPITAL_COMMUNITY): Payer: Managed Care, Other (non HMO)

## 2020-10-05 ENCOUNTER — Inpatient Hospital Stay: Payer: Managed Care, Other (non HMO)

## 2020-10-05 ENCOUNTER — Encounter (HOSPITAL_COMMUNITY): Payer: Self-pay

## 2020-10-06 ENCOUNTER — Inpatient Hospital Stay: Payer: Managed Care, Other (non HMO) | Admitting: Oncology

## 2020-10-06 ENCOUNTER — Ambulatory Visit: Payer: Managed Care, Other (non HMO) | Admitting: Oncology

## 2020-10-19 ENCOUNTER — Inpatient Hospital Stay: Payer: Managed Care, Other (non HMO) | Attending: Oncology

## 2020-10-19 ENCOUNTER — Other Ambulatory Visit: Payer: Self-pay

## 2020-10-19 ENCOUNTER — Telehealth: Payer: Self-pay | Admitting: *Deleted

## 2020-10-19 DIAGNOSIS — C187 Malignant neoplasm of sigmoid colon: Secondary | ICD-10-CM | POA: Diagnosis not present

## 2020-10-19 LAB — BASIC METABOLIC PANEL - CANCER CENTER ONLY
Anion gap: 8 (ref 5–15)
BUN: 13 mg/dL (ref 8–23)
CO2: 26 mmol/L (ref 22–32)
Calcium: 9.1 mg/dL (ref 8.9–10.3)
Chloride: 104 mmol/L (ref 98–111)
Creatinine: 1.36 mg/dL — ABNORMAL HIGH (ref 0.61–1.24)
GFR, Estimated: 57 mL/min — ABNORMAL LOW (ref >60)
Glucose, Bld: 136 mg/dL — ABNORMAL HIGH (ref 70–99)
Potassium: 4.5 mmol/L (ref 3.5–5.1)
Sodium: 138 mmol/L (ref 135–145)

## 2020-10-19 LAB — CEA (IN HOUSE-CHCC): CEA (CHCC-In House): 1.52 ng/mL (ref 0.00–5.00)

## 2020-10-19 NOTE — Telephone Encounter (Signed)
Informed him that renal functions are elevated. Do not take losartan/hctz tomorrow and hold until Dr. Benay Spice says to resume. He understands and agrees.

## 2020-10-20 ENCOUNTER — Ambulatory Visit
Admission: RE | Admit: 2020-10-20 | Discharge: 2020-10-20 | Disposition: A | Discharge status: Home / Self Care | Payer: Managed Care, Other (non HMO) | Source: Ambulatory Visit | Attending: Oncology | Admitting: Oncology

## 2020-10-20 DIAGNOSIS — C187 Malignant neoplasm of sigmoid colon: Secondary | ICD-10-CM

## 2020-10-20 MED ORDER — IOPAMIDOL (ISOVUE-300) INJECTION 61%
100.0000 mL | Freq: Once | INTRAVENOUS | Status: AC | PRN
  Administered 2020-10-20: 100 mL via INTRAVENOUS

## 2020-10-21 ENCOUNTER — Telehealth: Payer: Self-pay

## 2020-10-21 NOTE — Telephone Encounter (Signed)
Results of ct scan given and instruction to restart losarten as well as making pt aware f/u appt to be created x 1 month

## 2020-10-27 ENCOUNTER — Encounter: Payer: Self-pay | Admitting: *Deleted

## 2020-10-27 ENCOUNTER — Telehealth: Payer: Self-pay | Admitting: Nurse Practitioner

## 2020-10-27 NOTE — Telephone Encounter (Signed)
Scheduled appt per 11/30 sch msg - pt is aware of appt date and time

## 2020-10-27 NOTE — Progress Notes (Signed)
Per Dr. Benay Spice: Needs to see NP on 12/9 or 12/10. Scheduling message sent.

## 2020-11-02 ENCOUNTER — Ambulatory Visit
Admission: RE | Admit: 2020-11-02 | Discharge: 2020-11-02 | Disposition: A | Discharge status: Home / Self Care | Payer: Managed Care, Other (non HMO) | Source: Ambulatory Visit | Attending: Family Medicine | Admitting: Family Medicine

## 2020-11-02 ENCOUNTER — Other Ambulatory Visit: Payer: Self-pay | Admitting: Family Medicine

## 2020-11-02 DIAGNOSIS — M25562 Pain in left knee: Secondary | ICD-10-CM

## 2020-11-02 DIAGNOSIS — G8929 Other chronic pain: Secondary | ICD-10-CM

## 2020-11-03 ENCOUNTER — Telehealth: Payer: Self-pay | Admitting: Oncology

## 2020-11-03 NOTE — Telephone Encounter (Signed)
Release: [phone] Faxed medical records to [facility] @ fax [phone]

## 2020-11-06 ENCOUNTER — Encounter: Payer: Self-pay | Admitting: Nurse Practitioner

## 2020-11-06 ENCOUNTER — Other Ambulatory Visit: Payer: Self-pay

## 2020-11-06 ENCOUNTER — Inpatient Hospital Stay: Payer: Managed Care, Other (non HMO) | Attending: Oncology | Admitting: Nurse Practitioner

## 2020-11-06 VITALS — BP 135/68 | HR 76 | Temp 98.3°F | Resp 18 | Ht 69.5 in | Wt 260.1 lb

## 2020-11-06 DIAGNOSIS — Z79899 Other long term (current) drug therapy: Secondary | ICD-10-CM | POA: Insufficient documentation

## 2020-11-06 DIAGNOSIS — D701 Agranulocytosis secondary to cancer chemotherapy: Secondary | ICD-10-CM | POA: Diagnosis not present

## 2020-11-06 DIAGNOSIS — T451X5A Adverse effect of antineoplastic and immunosuppressive drugs, initial encounter: Secondary | ICD-10-CM | POA: Insufficient documentation

## 2020-11-06 DIAGNOSIS — I10 Essential (primary) hypertension: Secondary | ICD-10-CM | POA: Insufficient documentation

## 2020-11-06 DIAGNOSIS — Z23 Encounter for immunization: Secondary | ICD-10-CM | POA: Diagnosis not present

## 2020-11-06 DIAGNOSIS — Z800 Family history of malignant neoplasm of digestive organs: Secondary | ICD-10-CM | POA: Diagnosis not present

## 2020-11-06 DIAGNOSIS — C187 Malignant neoplasm of sigmoid colon: Secondary | ICD-10-CM | POA: Diagnosis present

## 2020-11-06 DIAGNOSIS — K760 Fatty (change of) liver, not elsewhere classified: Secondary | ICD-10-CM | POA: Insufficient documentation

## 2020-11-06 DIAGNOSIS — E119 Type 2 diabetes mellitus without complications: Secondary | ICD-10-CM | POA: Insufficient documentation

## 2020-11-06 DIAGNOSIS — R718 Other abnormality of red blood cells: Secondary | ICD-10-CM | POA: Diagnosis not present

## 2020-11-06 MED ORDER — INFLUENZA VAC A&B SA ADJ QUAD 0.5 ML IM PRSY
0.5000 mL | PREFILLED_SYRINGE | Freq: Once | INTRAMUSCULAR | Status: AC
  Administered 2020-11-06: 0.5 mL via INTRAMUSCULAR

## 2020-11-06 MED ORDER — INFLUENZA VAC A&B SA ADJ QUAD 0.5 ML IM PRSY
PREFILLED_SYRINGE | INTRAMUSCULAR | Status: AC
  Filled 2020-11-06: qty 0.5

## 2020-11-06 NOTE — Progress Notes (Addendum)
  [facility] OFFICE PROGRESS NOTE   Diagnosis: Colon cancer  INTERVAL HISTORY:   Angel [name] returns as scheduled.  He feels well.  No change in bowel habits.  No bleeding or pain with bowel movements.  He has a good appetite.  Objective:  Vital signs in last 24 hours:  Blood pressure 135/68, pulse 76, temperature 98.3 F (36.8 C), temperature source Tympanic, resp. rate 18, height 5' 9.5" (1.765 m), weight 260 lb 1.6 oz (118 kg), SpO2 100 %.    HEENT: Neck without mass. Lymphatics: No palpable cervical, supraclavicular, axillary or inguinal lymph nodes. Resp: Lungs clear bilaterally. Cardio: Regular rate and rhythm. GI: Abdomen soft and nontender.  No hepatomegaly. Vascular: No leg edema.  Lab Results:  Lab Results  Component Value Date   WBC 5.5 10/03/2017   HGB 14.5 10/03/2017   HCT 45.0 10/03/2017   MCV 76.1 (L) 10/03/2017   PLT 156 10/03/2017   NEUTROABS 3.2 05/26/2017    Imaging:  No results found.  Medications: I have reviewed the patient's current medications.  Assessment/Plan: 1. Colon cancer, sigmoid, stage IIIa (T1,N1), status post a partial colectomy 02/02/2017 ? 1/26 lymph nodes positive ? MSI-stable, no loss of mismatch repair protein expression ? Staging CTs 12/29/2016-small indeterminate left lung nodules ? Normal preoperative CEA ? Cycle 1 CAPOX 03/03/2017 ? Cycle 2 CAPOX 03/24/2017 ? Cycle 3 CAPOX 04/14/2017 ? Cycle 4 CAPOX 05/05/2017 ? CTs 11/06/2018- negative for recurrent disease, small left lung nodule stable from 2007-consider benign ? Colonoscopy 05/29/2019-negative ? CTs 10/25/2019-mild increase in a 1 cm small bowel mesenteric lymph node, no evidence of recurrent disease ? CTs 10/20/2020-several small left-sided lung nodule stable when compared to prior CTs.  No evidence of metastatic disease in the abdomen or pelvis.  Diffuse fatty infiltration of the liver.  2.  Indeterminate left lung nodules on a chest CT  12/29/2016, stable lung nodules on CT 10/25/2019  3. Chronic red cell microcytosis  4. Diabetes  5. Hypertension  6. Family history of colon cancer  7. Multiple polyps noted on the colonoscopy 12/19/2016  8.Neutropenia secondary to chemotherapy  Disposition: Angel [name] remains in remission from colon cancer.  The recent CT scans show no evidence of current/metastatic disease.  Most recent CEA in normal range.  He will be 4 years out from diagnosis in March 2022.  He will return for a CEA and follow-up visit in 8 months.  He will contact the office in the interim with any problems.  Influenza vaccine administered at today's visit.  Patient seen with Dr. Benay Spice.  Angel [name] ANP/GNP-BC   11/06/2020  2:00 PM  This was a shared visit with Angel [name].  Angel [name] remains in remission from colon cancer.  He will return for office visit in 8 months.  Angel Manson, MD

## 2020-11-23 ENCOUNTER — Telehealth: Payer: Self-pay | Admitting: Oncology

## 2020-11-23 NOTE — Telephone Encounter (Signed)
Released records to [facility] family practice to [phone]  Release: [phone]

## 2020-12-04 ENCOUNTER — Telehealth: Payer: Self-pay | Admitting: Oncology

## 2020-12-04 NOTE — Telephone Encounter (Signed)
Release: [phone] Faxed medical records to [facility] family practice  @ [phone]

## 2021-06-30 ENCOUNTER — Inpatient Hospital Stay: Payer: Managed Care, Other (non HMO) | Admitting: Oncology

## 2021-06-30 ENCOUNTER — Inpatient Hospital Stay: Payer: Managed Care, Other (non HMO) | Attending: Internal Medicine

## 2021-07-02 ENCOUNTER — Other Ambulatory Visit: Payer: Managed Care, Other (non HMO)

## 2021-07-02 ENCOUNTER — Ambulatory Visit: Payer: Managed Care, Other (non HMO) | Admitting: Oncology

## 2021-08-25 DIAGNOSIS — Z7984 Long term (current) use of oral hypoglycemic drugs: Secondary | ICD-10-CM | POA: Diagnosis not present

## 2021-08-25 DIAGNOSIS — E1169 Type 2 diabetes mellitus with other specified complication: Secondary | ICD-10-CM | POA: Diagnosis not present

## 2021-08-25 DIAGNOSIS — E7800 Pure hypercholesterolemia, unspecified: Secondary | ICD-10-CM | POA: Diagnosis not present

## 2021-08-25 DIAGNOSIS — I10 Essential (primary) hypertension: Secondary | ICD-10-CM | POA: Diagnosis not present

## 2021-08-25 DIAGNOSIS — Z23 Encounter for immunization: Secondary | ICD-10-CM | POA: Diagnosis not present

## 2021-08-25 DIAGNOSIS — Z794 Long term (current) use of insulin: Secondary | ICD-10-CM | POA: Diagnosis not present

## 2021-08-25 DIAGNOSIS — Q632 Ectopic kidney: Secondary | ICD-10-CM | POA: Diagnosis not present

## 2021-08-25 DIAGNOSIS — Z0000 Encounter for general adult medical examination without abnormal findings: Secondary | ICD-10-CM | POA: Diagnosis not present

## 2021-08-25 DIAGNOSIS — E039 Hypothyroidism, unspecified: Secondary | ICD-10-CM | POA: Diagnosis not present

## 2021-08-25 DIAGNOSIS — I700 Atherosclerosis of aorta: Secondary | ICD-10-CM | POA: Diagnosis not present

## 2021-08-25 DIAGNOSIS — C189 Malignant neoplasm of colon, unspecified: Secondary | ICD-10-CM | POA: Diagnosis not present

## 2022-03-24 DIAGNOSIS — I700 Atherosclerosis of aorta: Secondary | ICD-10-CM | POA: Diagnosis not present

## 2022-03-24 DIAGNOSIS — E7800 Pure hypercholesterolemia, unspecified: Secondary | ICD-10-CM | POA: Diagnosis not present

## 2022-03-24 DIAGNOSIS — E1169 Type 2 diabetes mellitus with other specified complication: Secondary | ICD-10-CM | POA: Diagnosis not present

## 2022-03-24 DIAGNOSIS — I10 Essential (primary) hypertension: Secondary | ICD-10-CM | POA: Diagnosis not present

## 2022-03-24 DIAGNOSIS — C189 Malignant neoplasm of colon, unspecified: Secondary | ICD-10-CM | POA: Diagnosis not present

## 2022-03-24 DIAGNOSIS — E039 Hypothyroidism, unspecified: Secondary | ICD-10-CM | POA: Diagnosis not present

## 2022-03-24 DIAGNOSIS — Z794 Long term (current) use of insulin: Secondary | ICD-10-CM | POA: Diagnosis not present

## 2022-03-24 DIAGNOSIS — R634 Abnormal weight loss: Secondary | ICD-10-CM | POA: Diagnosis not present

## 2022-04-07 DIAGNOSIS — E039 Hypothyroidism, unspecified: Secondary | ICD-10-CM | POA: Diagnosis not present

## 2022-04-07 DIAGNOSIS — E1169 Type 2 diabetes mellitus with other specified complication: Secondary | ICD-10-CM | POA: Diagnosis not present

## 2022-04-07 DIAGNOSIS — I10 Essential (primary) hypertension: Secondary | ICD-10-CM | POA: Diagnosis not present

## 2022-04-07 DIAGNOSIS — E7800 Pure hypercholesterolemia, unspecified: Secondary | ICD-10-CM | POA: Diagnosis not present

## 2022-04-14 ENCOUNTER — Inpatient Hospital Stay: Payer: 59

## 2022-04-14 ENCOUNTER — Inpatient Hospital Stay: Payer: 59 | Attending: Oncology | Admitting: Oncology

## 2022-04-14 VITALS — BP 137/80 | HR 77 | Temp 98.1°F | Resp 18 | Ht 69.5 in | Wt 256.0 lb

## 2022-04-14 DIAGNOSIS — C187 Malignant neoplasm of sigmoid colon: Secondary | ICD-10-CM

## 2022-04-14 DIAGNOSIS — I10 Essential (primary) hypertension: Secondary | ICD-10-CM | POA: Insufficient documentation

## 2022-04-14 DIAGNOSIS — R350 Frequency of micturition: Secondary | ICD-10-CM | POA: Insufficient documentation

## 2022-04-14 DIAGNOSIS — Z79899 Other long term (current) drug therapy: Secondary | ICD-10-CM | POA: Insufficient documentation

## 2022-04-14 DIAGNOSIS — R718 Other abnormality of red blood cells: Secondary | ICD-10-CM | POA: Diagnosis not present

## 2022-04-14 DIAGNOSIS — K760 Fatty (change of) liver, not elsewhere classified: Secondary | ICD-10-CM | POA: Insufficient documentation

## 2022-04-14 DIAGNOSIS — D701 Agranulocytosis secondary to cancer chemotherapy: Secondary | ICD-10-CM | POA: Diagnosis not present

## 2022-04-14 DIAGNOSIS — T451X5A Adverse effect of antineoplastic and immunosuppressive drugs, initial encounter: Secondary | ICD-10-CM | POA: Insufficient documentation

## 2022-04-14 DIAGNOSIS — E119 Type 2 diabetes mellitus without complications: Secondary | ICD-10-CM | POA: Insufficient documentation

## 2022-04-14 DIAGNOSIS — Z800 Family history of malignant neoplasm of digestive organs: Secondary | ICD-10-CM | POA: Diagnosis not present

## 2022-04-14 LAB — CEA (ACCESS): CEA (CHCC): 3.36 ng/mL (ref 0.00–5.00)

## 2022-04-14 NOTE — Progress Notes (Signed)
  [facility] OFFICE PROGRESS NOTE   Diagnosis: Colon cancer  INTERVAL HISTORY:   Angel [name] returns as scheduled.  He was last seen at the Cancer center in December 2021.  He feels well.  Good appetite.  No difficulty with bowel function.  No bleeding.  He reports urinary frequency.  He has intermittent respiratory congestion.  Objective:  Vital signs in last 24 hours:  Blood pressure 137/80, pulse 77, temperature 98.1 F (36.7 C), temperature source Oral, resp. rate 18, height 5' 9.5" (1.765 m), weight 256 lb (116.1 kg), SpO2 100 %.     Lymphatics: No cervical, supraclavicular, axillary, or inguinal nodes Resp: End inspiratory rhonchi at the posterior bases bilaterally, no respiratory distress Cardio: Regular rate and rhythm GI: No mass, nontender, no hepatosplenomegaly Vascular: No leg edema     Lab Results:  Lab Results  Component Value Date   WBC 5.5 10/03/2017   HGB 14.5 10/03/2017   HCT 45.0 10/03/2017   MCV 76.1 (L) 10/03/2017   PLT 156 10/03/2017   NEUTROABS 3.2 05/26/2017    CMP  Lab Results  Component Value Date   NA 138 10/19/2020   K 4.5 10/19/2020   CL 104 10/19/2020   CO2 26 10/19/2020   GLUCOSE 136 (H) 10/19/2020   BUN 13 10/19/2020   CREATININE 1.36 (H) 10/19/2020   CALCIUM 9.1 10/19/2020   PROT 7.7 05/26/2017   ALBUMIN 4.0 05/26/2017   AST 65 (H) 05/26/2017   ALT 84 (H) 05/26/2017   ALKPHOS 99 05/26/2017   BILITOT 0.57 05/26/2017   GFRNONAA 57 (L) 10/19/2020   GFRAA >60 05/08/2020    Lab Results  Component Value Date   CEA1 1.52 10/19/2020   CEA 3.6 12/20/2016     Medications: I have reviewed the patient's current medications.   Assessment/Plan: Colon cancer, sigmoid, stage IIIa (T1,N1), status post a partial colectomy 02/02/2017 1/26 lymph nodes positive MSI-stable, no loss of mismatch repair protein expression Staging CTs 12/29/2016-small indeterminate left lung nodules Normal preoperative CEA Cycle 1 CAPOX  03/03/2017 Cycle 2 CAPOX 03/24/2017 Cycle 3 CAPOX 04/14/2017 Cycle 4 CAPOX 05/05/2017 CTs 11/06/2018- negative for recurrent disease, small left lung nodule stable from 2007-consider benign Colonoscopy 05/29/2019-negative CTs 10/25/2019-mild increase in a 1 cm small bowel mesenteric lymph node, no evidence of recurrent disease CTs 10/20/2020-several small left-sided lung nodule stable when compared to prior CTs.  No evidence of metastatic disease in the abdomen or pelvis.  Diffuse fatty infiltration of the liver.   2.  Indeterminate left lung nodules on a chest CT 12/29/2016, stable lung nodules on CT 10/25/2019   3.   Chronic red cell microcytosis   4.   Diabetes   5.   Hypertension   6.   Family history of colon cancer   7.   Multiple polyps noted on the colonoscopy 12/19/2016   8.   Neutropenia secondary to chemotherapy    Disposition: Angel [name] is in clinical remission from colon cancer.  He is now 5 years out from diagnosis.  We will check the CEA today.  I will refer him to Dr. Watt Climes for a surveillance colonoscopy.  He will return for an office visit in 1 year.  Betsy Coder, MD  04/14/2022  9:07 AM

## 2022-04-15 ENCOUNTER — Telehealth: Payer: Self-pay

## 2022-04-15 NOTE — Telephone Encounter (Signed)
-----   Message from Ladell Pier, MD sent at 04/14/2022  5:30 PM EDT ----- Please call patient, CEA is normal, but slightly higher than on past readings, likely secondary to the different CEA assay here, repeat CEA in 4 months

## 2022-04-15 NOTE — Telephone Encounter (Addendum)
Pt verbalized understanding. Scheduling note sent to schedule lab visit in 4 months.

## 2022-04-18 ENCOUNTER — Other Ambulatory Visit: Payer: Self-pay | Admitting: *Deleted

## 2022-04-18 ENCOUNTER — Encounter: Payer: Self-pay | Admitting: *Deleted

## 2022-04-18 DIAGNOSIS — C187 Malignant neoplasm of sigmoid colon: Secondary | ICD-10-CM

## 2022-04-18 NOTE — Progress Notes (Signed)
Faxed referral order, office note and CEA result to Dr. Watt Climes at [phone]. Needs return visit for surveillance colonoscopy due in July 2023.

## 2022-04-21 DIAGNOSIS — E1169 Type 2 diabetes mellitus with other specified complication: Secondary | ICD-10-CM | POA: Diagnosis not present

## 2022-04-21 DIAGNOSIS — I10 Essential (primary) hypertension: Secondary | ICD-10-CM | POA: Diagnosis not present

## 2022-04-21 DIAGNOSIS — E039 Hypothyroidism, unspecified: Secondary | ICD-10-CM | POA: Diagnosis not present

## 2022-04-21 DIAGNOSIS — C189 Malignant neoplasm of colon, unspecified: Secondary | ICD-10-CM | POA: Diagnosis not present

## 2022-06-23 DIAGNOSIS — E1169 Type 2 diabetes mellitus with other specified complication: Secondary | ICD-10-CM | POA: Diagnosis not present

## 2022-06-23 DIAGNOSIS — I10 Essential (primary) hypertension: Secondary | ICD-10-CM | POA: Diagnosis not present

## 2022-06-23 DIAGNOSIS — E7800 Pure hypercholesterolemia, unspecified: Secondary | ICD-10-CM | POA: Diagnosis not present

## 2022-06-23 DIAGNOSIS — E039 Hypothyroidism, unspecified: Secondary | ICD-10-CM | POA: Diagnosis not present

## 2022-06-23 DIAGNOSIS — I700 Atherosclerosis of aorta: Secondary | ICD-10-CM | POA: Diagnosis not present

## 2022-06-23 DIAGNOSIS — Z794 Long term (current) use of insulin: Secondary | ICD-10-CM | POA: Diagnosis not present

## 2022-06-23 DIAGNOSIS — C189 Malignant neoplasm of colon, unspecified: Secondary | ICD-10-CM | POA: Diagnosis not present

## 2022-08-16 ENCOUNTER — Ambulatory Visit: Payer: Medicare Other | Admitting: Oncology

## 2022-08-16 ENCOUNTER — Inpatient Hospital Stay: Payer: 59 | Attending: Oncology

## 2022-08-29 DIAGNOSIS — Z86010 Personal history of colonic polyps: Secondary | ICD-10-CM | POA: Diagnosis not present

## 2022-08-29 DIAGNOSIS — Z85038 Personal history of other malignant neoplasm of large intestine: Secondary | ICD-10-CM | POA: Diagnosis not present

## 2022-08-29 DIAGNOSIS — K649 Unspecified hemorrhoids: Secondary | ICD-10-CM | POA: Diagnosis not present

## 2022-08-29 DIAGNOSIS — Z980 Intestinal bypass and anastomosis status: Secondary | ICD-10-CM | POA: Diagnosis not present

## 2022-08-29 DIAGNOSIS — D120 Benign neoplasm of cecum: Secondary | ICD-10-CM | POA: Diagnosis not present

## 2022-09-22 DIAGNOSIS — Q632 Ectopic kidney: Secondary | ICD-10-CM | POA: Diagnosis not present

## 2022-09-22 DIAGNOSIS — I700 Atherosclerosis of aorta: Secondary | ICD-10-CM | POA: Diagnosis not present

## 2022-09-22 DIAGNOSIS — Z0000 Encounter for general adult medical examination without abnormal findings: Secondary | ICD-10-CM | POA: Diagnosis not present

## 2022-09-22 DIAGNOSIS — Z23 Encounter for immunization: Secondary | ICD-10-CM | POA: Diagnosis not present

## 2022-09-22 DIAGNOSIS — C189 Malignant neoplasm of colon, unspecified: Secondary | ICD-10-CM | POA: Diagnosis not present

## 2022-09-22 DIAGNOSIS — Z794 Long term (current) use of insulin: Secondary | ICD-10-CM | POA: Diagnosis not present

## 2022-09-22 DIAGNOSIS — E1169 Type 2 diabetes mellitus with other specified complication: Secondary | ICD-10-CM | POA: Diagnosis not present

## 2022-09-22 DIAGNOSIS — E7800 Pure hypercholesterolemia, unspecified: Secondary | ICD-10-CM | POA: Diagnosis not present

## 2022-09-22 DIAGNOSIS — I10 Essential (primary) hypertension: Secondary | ICD-10-CM | POA: Diagnosis not present

## 2022-11-30 ENCOUNTER — Other Ambulatory Visit: Payer: Self-pay | Admitting: Internal Medicine

## 2022-11-30 ENCOUNTER — Ambulatory Visit
Admission: RE | Admit: 2022-11-30 | Discharge: 2022-11-30 | Disposition: A | Discharge status: Home / Self Care | Payer: 59 | Source: Ambulatory Visit | Attending: Internal Medicine | Admitting: Internal Medicine

## 2022-11-30 DIAGNOSIS — R059 Cough, unspecified: Secondary | ICD-10-CM

## 2022-11-30 DIAGNOSIS — R0602 Shortness of breath: Secondary | ICD-10-CM | POA: Diagnosis not present

## 2022-11-30 DIAGNOSIS — R0601 Orthopnea: Secondary | ICD-10-CM | POA: Diagnosis not present

## 2023-04-14 ENCOUNTER — Ambulatory Visit: Payer: Managed Care, Other (non HMO) | Admitting: Oncology

## 2023-04-14 ENCOUNTER — Other Ambulatory Visit: Payer: Managed Care, Other (non HMO)

## 2023-05-05 ENCOUNTER — Other Ambulatory Visit: Payer: Self-pay | Admitting: *Deleted

## 2023-05-05 DIAGNOSIS — C187 Malignant neoplasm of sigmoid colon: Secondary | ICD-10-CM

## 2023-05-12 ENCOUNTER — Encounter: Payer: Self-pay | Admitting: *Deleted

## 2023-05-12 ENCOUNTER — Inpatient Hospital Stay: Payer: Medicare Other

## 2023-05-12 ENCOUNTER — Inpatient Hospital Stay: Payer: Medicare Other | Attending: Oncology | Admitting: Oncology

## 2023-05-12 NOTE — Progress Notes (Signed)
"  No show" for 1 year f/u and lab. Scheduling message sent to reschedule for 1-2 months.

## 2023-05-15 ENCOUNTER — Telehealth: Payer: Self-pay | Admitting: Oncology

## 2023-05-15 NOTE — Telephone Encounter (Signed)
Called patient twice call couldn't be completed

## 2023-07-14 ENCOUNTER — Inpatient Hospital Stay: Payer: Medicare Other | Attending: Oncology

## 2023-07-14 ENCOUNTER — Inpatient Hospital Stay: Payer: Medicare Other | Admitting: Oncology

## 2023-07-14 ENCOUNTER — Encounter: Payer: Self-pay | Admitting: *Deleted

## 2023-07-14 NOTE — Progress Notes (Unsigned)
2nd "no show" for Angel [name] today. Will attempt to reschedule.

## 2023-08-21 ENCOUNTER — Inpatient Hospital Stay (HOSPITAL_BASED_OUTPATIENT_CLINIC_OR_DEPARTMENT_OTHER): Payer: 59 | Admitting: Nurse Practitioner

## 2023-08-21 ENCOUNTER — Encounter: Payer: Self-pay | Admitting: Nurse Practitioner

## 2023-08-21 ENCOUNTER — Inpatient Hospital Stay: Payer: 59 | Attending: Oncology

## 2023-08-21 VITALS — BP 137/82 | HR 79 | Temp 98.1°F | Resp 18 | Ht 69.0 in | Wt 246.0 lb

## 2023-08-21 DIAGNOSIS — K760 Fatty (change of) liver, not elsewhere classified: Secondary | ICD-10-CM | POA: Insufficient documentation

## 2023-08-21 DIAGNOSIS — R634 Abnormal weight loss: Secondary | ICD-10-CM | POA: Diagnosis not present

## 2023-08-21 DIAGNOSIS — I10 Essential (primary) hypertension: Secondary | ICD-10-CM | POA: Insufficient documentation

## 2023-08-21 DIAGNOSIS — D701 Agranulocytosis secondary to cancer chemotherapy: Secondary | ICD-10-CM | POA: Diagnosis not present

## 2023-08-21 DIAGNOSIS — R718 Other abnormality of red blood cells: Secondary | ICD-10-CM | POA: Insufficient documentation

## 2023-08-21 DIAGNOSIS — C187 Malignant neoplasm of sigmoid colon: Secondary | ICD-10-CM | POA: Diagnosis not present

## 2023-08-21 DIAGNOSIS — Z79899 Other long term (current) drug therapy: Secondary | ICD-10-CM | POA: Insufficient documentation

## 2023-08-21 DIAGNOSIS — T451X5A Adverse effect of antineoplastic and immunosuppressive drugs, initial encounter: Secondary | ICD-10-CM | POA: Diagnosis not present

## 2023-08-21 DIAGNOSIS — Z800 Family history of malignant neoplasm of digestive organs: Secondary | ICD-10-CM | POA: Insufficient documentation

## 2023-08-21 DIAGNOSIS — E119 Type 2 diabetes mellitus without complications: Secondary | ICD-10-CM | POA: Diagnosis not present

## 2023-08-21 LAB — CBC WITH DIFFERENTIAL (CANCER CENTER ONLY)
Abs Immature Granulocytes: 0.01 10*3/uL (ref 0.00–0.07)
Basophils Absolute: 0 10*3/uL (ref 0.0–0.1)
Basophils Relative: 1 %
Eosinophils Absolute: 0.2 10*3/uL (ref 0.0–0.5)
Eosinophils Relative: 3 %
HCT: 50.3 % (ref 39.0–52.0)
Hemoglobin: 15.8 g/dL (ref 13.0–17.0)
Immature Granulocytes: 0 %
Lymphocytes Relative: 62 %
Lymphs Abs: 5.4 10*3/uL — ABNORMAL HIGH (ref 0.7–4.0)
MCH: 23.1 pg — ABNORMAL LOW (ref 26.0–34.0)
MCHC: 31.4 g/dL (ref 30.0–36.0)
MCV: 73.5 fL — ABNORMAL LOW (ref 80.0–100.0)
Monocytes Absolute: 1 10*3/uL (ref 0.1–1.0)
Monocytes Relative: 12 %
Neutro Abs: 1.9 10*3/uL (ref 1.7–7.7)
Neutrophils Relative %: 22 %
Platelet Count: 146 10*3/uL — ABNORMAL LOW (ref 150–400)
RBC: 6.84 MIL/uL — ABNORMAL HIGH (ref 4.22–5.81)
RDW: 17.7 % — ABNORMAL HIGH (ref 11.5–15.5)
WBC Count: 8.6 10*3/uL (ref 4.0–10.5)
nRBC: 0 % (ref 0.0–0.2)

## 2023-08-21 LAB — CMP (CANCER CENTER ONLY)
ALT: 27 U/L (ref 0–44)
AST: 35 U/L (ref 15–41)
Albumin: 4.7 g/dL (ref 3.5–5.0)
Alkaline Phosphatase: 109 U/L (ref 38–126)
Anion gap: 9 (ref 5–15)
BUN: 14 mg/dL (ref 8–23)
CO2: 27 mmol/L (ref 22–32)
Calcium: 9.6 mg/dL (ref 8.9–10.3)
Chloride: 104 mmol/L (ref 98–111)
Creatinine: 1.04 mg/dL (ref 0.61–1.24)
GFR, Estimated: >60 mL/min (ref >60)
Glucose, Bld: 108 mg/dL — ABNORMAL HIGH (ref 70–99)
Potassium: 4.3 mmol/L (ref 3.5–5.1)
Sodium: 140 mmol/L (ref 135–145)
Total Bilirubin: 0.7 mg/dL (ref 0.3–1.2)
Total Protein: 7.6 g/dL (ref 6.5–8.1)

## 2023-08-21 LAB — CEA (ACCESS): CEA (CHCC): 2.09 ng/mL (ref 0.00–5.00)

## 2023-08-21 LAB — LACTATE DEHYDROGENASE: LDH: 510 U/L — ABNORMAL HIGH (ref 98–192)

## 2023-08-21 NOTE — Progress Notes (Signed)
  [facility] OFFICE PROGRESS NOTE   Diagnosis: Colon cancer  INTERVAL HISTORY:   Angel [name] returns for follow-up.  He was last seen at the cancer center 04/14/2022.  His wife reports noticing "swollen lymph nodes" on his neck about 2 weeks ago.  He thinks the lymph nodes are increasing in size.  No fevers or sweats.  No change in bowel habits.  No bloody or black bowel movements.  He urinates frequently at nighttime.  He reports a good appetite.  He has lost about 10 pounds since his last visit May 2023.  He wonders if the weight loss is due to Ozempic.  No cough or shortness of breath.  Objective:  Vital signs in last 24 hours:  Blood pressure 137/82, pulse 79, temperature 98.1 F (36.7 C), temperature source Temporal, resp. rate 18, height 5\' 9"  (1.753 m), weight 246 lb (111.6 kg), SpO2 100%.    HEENT: No mass in the oral cavity. Lymphatics: Bilateral cervical/scalene/supraclavicular adenopathy; bilateral axillary adenopathy left greater than right and firm fullness in both inguinal regions. Resp: Lungs clear bilaterally. Cardio: Regular rate and rhythm. GI: No hepatosplenomegaly.  No mass. Vascular: No leg edema.   Lab Results:  Lab Results  Component Value Date   WBC 5.5 10/03/2017   HGB 14.5 10/03/2017   HCT 45.0 10/03/2017   MCV 76.1 (L) 10/03/2017   PLT 156 10/03/2017   NEUTROABS 3.2 05/26/2017    Imaging:  No results found.  Medications: I have reviewed the patient's current medications.  Assessment/Plan: Colon cancer, sigmoid, stage IIIa (T1,N1), status post a partial colectomy 02/02/2017 1/26 lymph nodes positive MSI-stable, no loss of mismatch repair protein expression Staging CTs 12/29/2016-small indeterminate left lung nodules Normal preoperative CEA Cycle 1 CAPOX 03/03/2017 Cycle 2 CAPOX 03/24/2017 Cycle 3 CAPOX 04/14/2017 Cycle 4 CAPOX 05/05/2017 CTs 11/06/2018- negative for recurrent disease, small left lung nodule stable from  2007-consider benign Colonoscopy 05/29/2019-negative CTs 10/25/2019-mild increase in a 1 cm small bowel mesenteric lymph node, no evidence of recurrent disease CTs 10/20/2020-several small left-sided lung nodule stable when compared to prior CTs.  No evidence of metastatic disease in the abdomen or pelvis.  Diffuse fatty infiltration of the liver.   2.  Indeterminate left lung nodules on a chest CT 12/29/2016, stable lung nodules on CT 10/25/2019   3.   Chronic red cell microcytosis   4.   Diabetes   5.   Hypertension   6.   Family history of colon cancer   7.   Multiple polyps noted on the colonoscopy 12/19/2016   8.   Neutropenia secondary to chemotherapy  Disposition: Angel [name] has a history of stage IIIa colon cancer dating to 2018.  He presents today for routine follow-up.  On exam he appears to have diffuse peripheral adenopathy.  Obtaining baseline labs today and referring for CT scans later this week.  He will return for follow-up in 1 week.    Angel [name] ANP/GNP-BC   08/21/2023  1:40 PM

## 2023-08-23 DIAGNOSIS — R634 Abnormal weight loss: Secondary | ICD-10-CM | POA: Diagnosis not present

## 2023-08-23 DIAGNOSIS — R599 Enlarged lymph nodes, unspecified: Secondary | ICD-10-CM | POA: Diagnosis not present

## 2023-08-24 ENCOUNTER — Ambulatory Visit (HOSPITAL_COMMUNITY)
Admission: RE | Admit: 2023-08-24 | Discharge: 2023-08-24 | Disposition: A | Discharge status: Home / Self Care | Payer: Medicare Other | Source: Ambulatory Visit | Attending: Nurse Practitioner | Admitting: Nurse Practitioner

## 2023-08-24 DIAGNOSIS — I6523 Occlusion and stenosis of bilateral carotid arteries: Secondary | ICD-10-CM | POA: Diagnosis not present

## 2023-08-24 DIAGNOSIS — C187 Malignant neoplasm of sigmoid colon: Secondary | ICD-10-CM | POA: Diagnosis not present

## 2023-08-24 DIAGNOSIS — Q632 Ectopic kidney: Secondary | ICD-10-CM | POA: Diagnosis not present

## 2023-08-24 DIAGNOSIS — C189 Malignant neoplasm of colon, unspecified: Secondary | ICD-10-CM | POA: Diagnosis not present

## 2023-08-24 DIAGNOSIS — R591 Generalized enlarged lymph nodes: Secondary | ICD-10-CM | POA: Diagnosis not present

## 2023-08-24 DIAGNOSIS — J9811 Atelectasis: Secondary | ICD-10-CM | POA: Diagnosis not present

## 2023-08-24 MED ORDER — IOHEXOL 350 MG/ML SOLN
100.0000 mL | Freq: Once | INTRAVENOUS | Status: AC | PRN
  Administered 2023-08-24: 100 mL via INTRAVENOUS

## 2023-08-24 MED ORDER — IOHEXOL 9 MG/ML PO SOLN
1000.0000 mL | ORAL | Status: AC
  Administered 2023-08-24: 1000 mL via ORAL

## 2023-08-28 ENCOUNTER — Other Ambulatory Visit: Payer: Self-pay | Admitting: Nurse Practitioner

## 2023-08-28 DIAGNOSIS — C187 Malignant neoplasm of sigmoid colon: Secondary | ICD-10-CM

## 2023-08-29 ENCOUNTER — Encounter: Payer: Self-pay | Admitting: Nurse Practitioner

## 2023-08-29 ENCOUNTER — Inpatient Hospital Stay: Payer: 59 | Attending: Oncology | Admitting: Nurse Practitioner

## 2023-08-29 ENCOUNTER — Inpatient Hospital Stay: Payer: 59

## 2023-08-29 VITALS — BP 134/80 | HR 79 | Temp 98.1°F | Resp 18 | Ht 69.0 in | Wt 248.0 lb

## 2023-08-29 DIAGNOSIS — Z800 Family history of malignant neoplasm of digestive organs: Secondary | ICD-10-CM | POA: Diagnosis not present

## 2023-08-29 DIAGNOSIS — D701 Agranulocytosis secondary to cancer chemotherapy: Secondary | ICD-10-CM | POA: Diagnosis not present

## 2023-08-29 DIAGNOSIS — Z79899 Other long term (current) drug therapy: Secondary | ICD-10-CM | POA: Insufficient documentation

## 2023-08-29 DIAGNOSIS — R718 Other abnormality of red blood cells: Secondary | ICD-10-CM | POA: Diagnosis not present

## 2023-08-29 DIAGNOSIS — T451X5A Adverse effect of antineoplastic and immunosuppressive drugs, initial encounter: Secondary | ICD-10-CM | POA: Diagnosis not present

## 2023-08-29 DIAGNOSIS — E119 Type 2 diabetes mellitus without complications: Secondary | ICD-10-CM | POA: Diagnosis not present

## 2023-08-29 DIAGNOSIS — K760 Fatty (change of) liver, not elsewhere classified: Secondary | ICD-10-CM | POA: Insufficient documentation

## 2023-08-29 DIAGNOSIS — R590 Localized enlarged lymph nodes: Secondary | ICD-10-CM | POA: Insufficient documentation

## 2023-08-29 DIAGNOSIS — C187 Malignant neoplasm of sigmoid colon: Secondary | ICD-10-CM | POA: Diagnosis not present

## 2023-08-29 DIAGNOSIS — I10 Essential (primary) hypertension: Secondary | ICD-10-CM | POA: Diagnosis not present

## 2023-08-29 DIAGNOSIS — R591 Generalized enlarged lymph nodes: Secondary | ICD-10-CM | POA: Diagnosis not present

## 2023-08-29 DIAGNOSIS — C9110 Chronic lymphocytic leukemia of B-cell type not having achieved remission: Secondary | ICD-10-CM | POA: Insufficient documentation

## 2023-08-29 LAB — CBC WITH DIFFERENTIAL (CANCER CENTER ONLY)
Abs Immature Granulocytes: 0.01 10*3/uL (ref 0.00–0.07)
Basophils Absolute: 0 10*3/uL (ref 0.0–0.1)
Basophils Relative: 0 %
Eosinophils Absolute: 0.2 10*3/uL (ref 0.0–0.5)
Eosinophils Relative: 3 %
HCT: 50.1 % (ref 39.0–52.0)
Hemoglobin: 15.6 g/dL (ref 13.0–17.0)
Immature Granulocytes: 0 %
Lymphocytes Relative: 46 %
Lymphs Abs: 3.8 10*3/uL (ref 0.7–4.0)
MCH: 23.2 pg — ABNORMAL LOW (ref 26.0–34.0)
MCHC: 31.1 g/dL (ref 30.0–36.0)
MCV: 74.6 fL — ABNORMAL LOW (ref 80.0–100.0)
Monocytes Absolute: 2.1 10*3/uL — ABNORMAL HIGH (ref 0.1–1.0)
Monocytes Relative: 25 %
Neutro Abs: 2.2 10*3/uL (ref 1.7–7.7)
Neutrophils Relative %: 26 %
Platelet Count: 146 10*3/uL — ABNORMAL LOW (ref 150–400)
RBC: 6.72 MIL/uL — ABNORMAL HIGH (ref 4.22–5.81)
RDW: 17.8 % — ABNORMAL HIGH (ref 11.5–15.5)
WBC Count: 8.4 10*3/uL (ref 4.0–10.5)
nRBC: 0 % (ref 0.0–0.2)

## 2023-08-29 LAB — SAVE SMEAR(SSMR), FOR PROVIDER SLIDE REVIEW

## 2023-08-29 NOTE — Progress Notes (Signed)
[facility] OFFICE PROGRESS NOTE   [name]: Colon cancer, lymphadenopathy  INTERVAL HISTORY:   Angel [name] returns as scheduled.  He was last seen 08/21/2023.  On exam he was noted to have extensive peripheral adenopathy.  He was referred for CT scans.  A right axillary lymph node is now painful.  No fevers or sweats.  He has a good appetite.  He attributes weight loss to Ozempic.  Objective:  Vital signs in last 24 hours:  Blood pressure 134/80, pulse 79, temperature 98.1 F (36.7 C), temperature source Oral, resp. rate 18, height 5\' 9"  (1.753 m), weight 248 lb (112.5 kg), SpO2 98%.    Lymphatics: Bilateral cervical, supraclavicular, axillary and inguinal nodes.  Right femoral node. Resp: Lungs clear bilaterally. Cardio: Regular rate and rhythm. GI: No hepatosplenomegaly. Vascular: No leg edema.  Lab Results:  Lab Results  Component Value Date   WBC 8.4 08/29/2023   HGB 15.6 08/29/2023   HCT 50.1 08/29/2023   MCV 74.6 (L) 08/29/2023   PLT 146 (L) 08/29/2023   NEUTROABS 2.2 08/29/2023  Peripheral blood smear-increased number of atypical lymphs, no clear monotonous population, few smudge cells; platelets appear mildly decreased, few giant platelets; few ovalocytes, rare teardrop, polychromasia not increased.  Imaging:  No results found.  Medications: I have reviewed the patient's current medications.  Assessment/Plan: Colon cancer, sigmoid, stage IIIa (T1,N1), status post a partial colectomy 02/02/2017 1/26 lymph nodes positive MSI-stable, no loss of mismatch repair protein expression Staging CTs 12/29/2016-small indeterminate left lung nodules Normal preoperative CEA Cycle 1 CAPOX 03/03/2017 Cycle 2 CAPOX 03/24/2017 Cycle 3 CAPOX 04/14/2017 Cycle 4 CAPOX 05/05/2017 CTs 11/06/2018- negative for recurrent disease, small left lung nodule stable from 2007-consider benign Colonoscopy 05/29/2019-negative CTs 10/25/2019-mild increase in a 1 cm small  bowel mesenteric lymph node, no evidence of recurrent disease CTs 10/20/2020-several small left-sided lung nodule stable when compared to prior CTs.  No evidence of metastatic disease in the abdomen or pelvis.  Diffuse fatty infiltration of the liver.   2.  Indeterminate left lung nodules on a chest CT 12/29/2016, stable lung nodules on CT 10/25/2019   3.   Chronic red cell microcytosis   4.   Diabetes   5.   Hypertension   6.   Family history of colon cancer   7.   Multiple polyps noted on the colonoscopy 12/19/2016   8.   Neutropenia secondary to chemotherapy  9.   Extensive peripheral adenopathy on exam 08/21/2023-CTs neck through pelvis 08/24/2023 with extensive lymphadenopathy neck, chest, abdomen and pelvis.  Scattered tiny lung nodule stable from prior examination and compatible with a benign finding.    Disposition: Angel [name] has a history of colon cancer dating to 2018.  He was seen in routine follow-up last week and noted to have significant peripheral adenopathy.  CT scans confirm extensive adenopathy.  We are referring him for an urgent excisional lymph node biopsy.  He is largely asymptomatic.  He began experiencing right axillary pain recently.  He will return for follow-up in about 10 days to review biopsy results.  We are available to see him sooner if needed.  He understands to contact the office if he develops dyspnea, dysphagia or other worrisome symptoms.  Patient seen with Dr. Truett Perna.  Lonna Cobb ANP/GNP-BC   08/29/2023  12:08 PM This was a shared visit with Lonna Cobb.  Angel [name] was interviewed and examined.  I reviewed the peripheral blood smear.  He presents with marked diffuse lymphadenopathy  on physical exam and by CT imaging.  There is a mild peripheral lymphocytosis.  Review the peripheral blood smear reveals an atypical mononuclear cell population, but I do not see a clear monotonous population diagnostic of malignancy.  We will submit peripheral  blood for flow cytometry and refer him for a diagnostic lymph node biopsy.  We discussed the differential [name] with Angel [name].  He most likely has lymphoma.  CLL is in the differential [name].  I was present for greater than 50% of today's visit.  I performed medical decision making.  Mancel Bale, MD

## 2023-08-30 LAB — BETA 2 MICROGLOBULIN, SERUM: Beta-2 Microglobulin: 2.7 mg/L — ABNORMAL HIGH (ref 0.6–2.4)

## 2023-08-30 LAB — KAPPA/LAMBDA LIGHT CHAINS
Kappa free light chain: 14.8 mg/L (ref 3.3–19.4)
Kappa, lambda light chain ratio: 0.69 (ref 0.26–1.65)
Lambda free light chains: 21.3 mg/L (ref 5.7–26.3)

## 2023-08-31 LAB — MULTIPLE MYELOMA PANEL, SERUM
Albumin SerPl Elph-Mcnc: 3.6 g/dL (ref 2.9–4.4)
Albumin/Glob SerPl: 1.2 (ref 0.7–1.7)
Alpha 1: 0.3 g/dL (ref 0.0–0.4)
Alpha2 Glob SerPl Elph-Mcnc: 0.8 g/dL (ref 0.4–1.0)
B-Globulin SerPl Elph-Mcnc: 1.2 g/dL (ref 0.7–1.3)
Gamma Glob SerPl Elph-Mcnc: 0.8 g/dL (ref 0.4–1.8)
Globulin, Total: 3.1 g/dL (ref 2.2–3.9)
IgA: 128 mg/dL (ref 61–437)
IgG (Immunoglobin G), Serum: 870 mg/dL (ref 603–1613)
IgM (Immunoglobulin M), Srm: 60 mg/dL (ref 20–172)
Total Protein ELP: 6.7 g/dL (ref 6.0–8.5)

## 2023-09-04 ENCOUNTER — Ambulatory Visit: Payer: Self-pay | Admitting: General Surgery

## 2023-09-04 ENCOUNTER — Other Ambulatory Visit: Payer: Self-pay

## 2023-09-04 ENCOUNTER — Telehealth: Payer: Self-pay

## 2023-09-04 DIAGNOSIS — R599 Enlarged lymph nodes, unspecified: Secondary | ICD-10-CM | POA: Diagnosis not present

## 2023-09-04 LAB — SURGICAL PATHOLOGY

## 2023-09-04 NOTE — H&P (View-Only) (Signed)
Expand All Collapse All      REFERRING PHYSICIAN:  Dr Truett Perna   PROVIDER:  Elenora Gamma, MD   MRN: [id number] DOB: 1951/08/20 DATE OF ENCOUNTER: 09/04/2023    Subjective   Chief Complaint: New Consultation (Colon Cancer)       History of Present Illness: Angel [name] is a 72 y.o. male who is seen today as an office consultation at the request of Dr. Truett Perna for evaluation of lymphadenopathy.     Patient is known to me due to a colon cancer that was resected in March 2018.  He then underwent incisional hernia repair in November 2018.  He has been doing well until recently.  He reported noticing some swollen lymph nodes approximately 3 weeks ago.  He also has a 10 pound weight loss in the last year.  He was noted to have diffuse lymphadenopathy.  Went CT scans of the chest abdomen pelvis and neck.  Extensive cervical, chest abdomen and pelvis lymphadenopathy was present.   Review of Systems: A complete review of systems was obtained from the patient.  I have reviewed this information and discussed as appropriate with the patient.  See HPI as well for other ROS.       Medical History:  Past Medical History Past Medical History: Diagnosis Date  Arthritis    Asthma, unspecified asthma severity, unspecified whether complicated, unspecified whether persistent (HHS-HCC)    Diabetes mellitus without complication (CMS/HHS-HCC)    History of cancer          Problem List There is no problem list on file for this patient.      Past Surgical History Past Surgical History: Procedure Laterality Date  APPENDECTOMY      COLON SURGERY      HERNIA REPAIR           Allergies No Known Allergies     Medications Ordered Prior to Encounter Current Outpatient Medications on File Prior to Visit Medication Sig Dispense Refill  aspirin 81 MG EC tablet Take 81 mg by mouth once daily      glyBURIDE (DIABETA) 5 MG tablet Take 5 mg by mouth daily with breakfast       metFORMIN (GLUCOPHAGE) 1000 MG tablet        omeprazole (PRILOSEC) 20 MG DR capsule TAKE 1 CAPSULE BY MOUTH EVERY DAY 30 MINUTES BEFORE MORNING MEAL FOR 90 DAYS      OZEMPIC 1 mg/dose (4 mg/3 mL) pen injector 1 mg Subcutaneous once a week for 30 days        No current facility-administered medications on file prior to visit.       Family History Family History Problem Relation Age of Onset  High blood pressure (Hypertension) Mother    Colon cancer Mother         Tobacco Use History Social History    Tobacco Use Smoking Status Never Smokeless Tobacco Never       Social History Social History    Socioeconomic History  Marital status: Married Tobacco Use  Smoking status: Never  Smokeless tobacco: Never Vaping Use  Vaping status: Never Used Substance and Sexual Activity  Alcohol use: Yes  Drug use: Never       Objective:      Vitals:   09/04/23 0918 09/04/23 0921 BP: 132/84   Pulse: 87   Temp: 36.7 C (98 F)   SpO2: 92%   Weight: (!) 112.9 kg (248 lb 12.8 oz)   Height: 177.8 cm (5\' 10" )  PainSc:   0-No pain     Exam Gen: NAD Abd: soft Significant cervical lymphadenopathy noted bilaterally along the anterior and posterior chains.  Large axillary lymph node and a smaller mobile axillary node noted in the right axilla.     Labs, Imaging and Diagnostic Testing: CT reports reviewed    Assessment and Plan: Diagnoses and all orders for this visit:   Adenopathy       72 year old male with a history of colon cancer who presents to the office with new lymphadenopathy noted.  CT scans suggest generalized lymphadenopathy throughout the entire body.  This is concerning for a possible lymphoma.  Lymph node biopsy was recommended.  Patient is somewhat symptomatic from his right axillary lymph node.  This appears superficial and therefore I have recommended excision of this lymph node for symptom relief as well as diagnosis.  We we will schedule this  for later this week as an urgent case.   Angel Panda, MD Colon and Rectal Surgery [facility]

## 2023-09-04 NOTE — H&P (View-Only) (Signed)
Expand All Collapse All      REFERRING PHYSICIAN:  Dr Truett Perna   PROVIDER:  Elenora Gamma, MD   MRN: [id number] DOB: 1951/08/20 DATE OF ENCOUNTER: 09/04/2023    Subjective   Chief Complaint: New Consultation (Colon Cancer)       History of Present Illness: Angel [name] is a 72 y.o. male who is seen today as an office consultation at the request of Dr. Truett Perna for evaluation of lymphadenopathy.     Patient is known to me due to a colon cancer that was resected in March 2018.  He then underwent incisional hernia repair in November 2018.  He has been doing well until recently.  He reported noticing some swollen lymph nodes approximately 3 weeks ago.  He also has a 10 pound weight loss in the last year.  He was noted to have diffuse lymphadenopathy.  Went CT scans of the chest abdomen pelvis and neck.  Extensive cervical, chest abdomen and pelvis lymphadenopathy was present.   Review of Systems: A complete review of systems was obtained from the patient.  I have reviewed this information and discussed as appropriate with the patient.  See HPI as well for other ROS.       Medical History:  Past Medical History Past Medical History: Diagnosis Date  Arthritis    Asthma, unspecified asthma severity, unspecified whether complicated, unspecified whether persistent (HHS-HCC)    Diabetes mellitus without complication (CMS/HHS-HCC)    History of cancer          Problem List There is no problem list on file for this patient.      Past Surgical History Past Surgical History: Procedure Laterality Date  APPENDECTOMY      COLON SURGERY      HERNIA REPAIR           Allergies No Known Allergies     Medications Ordered Prior to Encounter Current Outpatient Medications on File Prior to Visit Medication Sig Dispense Refill  aspirin 81 MG EC tablet Take 81 mg by mouth once daily      glyBURIDE (DIABETA) 5 MG tablet Take 5 mg by mouth daily with breakfast       metFORMIN (GLUCOPHAGE) 1000 MG tablet        omeprazole (PRILOSEC) 20 MG DR capsule TAKE 1 CAPSULE BY MOUTH EVERY DAY 30 MINUTES BEFORE MORNING MEAL FOR 90 DAYS      OZEMPIC 1 mg/dose (4 mg/3 mL) pen injector 1 mg Subcutaneous once a week for 30 days        No current facility-administered medications on file prior to visit.       Family History Family History Problem Relation Age of Onset  High blood pressure (Hypertension) Mother    Colon cancer Mother         Tobacco Use History Social History    Tobacco Use Smoking Status Never Smokeless Tobacco Never       Social History Social History    Socioeconomic History  Marital status: Married Tobacco Use  Smoking status: Never  Smokeless tobacco: Never Vaping Use  Vaping status: Never Used Substance and Sexual Activity  Alcohol use: Yes  Drug use: Never       Objective:      Vitals:   09/04/23 0918 09/04/23 0921 BP: 132/84   Pulse: 87   Temp: 36.7 C (98 F)   SpO2: 92%   Weight: (!) 112.9 kg (248 lb 12.8 oz)   Height: 177.8 cm (5\' 10" )  PainSc:   0-No pain     Exam Gen: NAD Abd: soft Significant cervical lymphadenopathy noted bilaterally along the anterior and posterior chains.  Large axillary lymph node and a smaller mobile axillary node noted in the right axilla.     Labs, Imaging and Diagnostic Testing: CT reports reviewed    Assessment and Plan: Diagnoses and all orders for this visit:   Adenopathy       72 year old male with a history of colon cancer who presents to the office with new lymphadenopathy noted.  CT scans suggest generalized lymphadenopathy throughout the entire body.  This is concerning for a possible lymphoma.  Lymph node biopsy was recommended.  Patient is somewhat symptomatic from his right axillary lymph node.  This appears superficial and therefore I have recommended excision of this lymph node for symptom relief as well as diagnosis.  We we will schedule this  for later this week as an urgent case.   Vanita Panda, MD Colon and Rectal Surgery [facility]

## 2023-09-04 NOTE — Telephone Encounter (Signed)
Patient is schedule on 09/14/23 at 1700 for his lymph node biopsy.

## 2023-09-04 NOTE — Telephone Encounter (Signed)
-----   Message from Lonna Cobb sent at 09/04/2023  8:34 AM EDT ----- Has he been scheduled for the lymph node biopsy?

## 2023-09-04 NOTE — H&P (Signed)
Expand All Collapse All      REFERRING PHYSICIAN:  Dr Truett Perna   PROVIDER:  Elenora Gamma, MD   MRN: [id number] DOB: 1951/08/20 DATE OF ENCOUNTER: 09/04/2023    Subjective   Chief Complaint: New Consultation (Colon Cancer)       History of Present Illness: Angel [name] is a 72 y.o. male who is seen today as an office consultation at the request of Dr. Truett Perna for evaluation of lymphadenopathy.     Patient is known to me due to a colon cancer that was resected in March 2018.  He then underwent incisional hernia repair in November 2018.  He has been doing well until recently.  He reported noticing some swollen lymph nodes approximately 3 weeks ago.  He also has a 10 pound weight loss in the last year.  He was noted to have diffuse lymphadenopathy.  Went CT scans of the chest abdomen pelvis and neck.  Extensive cervical, chest abdomen and pelvis lymphadenopathy was present.   Review of Systems: A complete review of systems was obtained from the patient.  I have reviewed this information and discussed as appropriate with the patient.  See HPI as well for other ROS.       Medical History:  Past Medical History Past Medical History: Diagnosis Date  Arthritis    Asthma, unspecified asthma severity, unspecified whether complicated, unspecified whether persistent (HHS-HCC)    Diabetes mellitus without complication (CMS/HHS-HCC)    History of cancer          Problem List There is no problem list on file for this patient.      Past Surgical History Past Surgical History: Procedure Laterality Date  APPENDECTOMY      COLON SURGERY      HERNIA REPAIR           Allergies No Known Allergies     Medications Ordered Prior to Encounter Current Outpatient Medications on File Prior to Visit Medication Sig Dispense Refill  aspirin 81 MG EC tablet Take 81 mg by mouth once daily      glyBURIDE (DIABETA) 5 MG tablet Take 5 mg by mouth daily with breakfast       metFORMIN (GLUCOPHAGE) 1000 MG tablet        omeprazole (PRILOSEC) 20 MG DR capsule TAKE 1 CAPSULE BY MOUTH EVERY DAY 30 MINUTES BEFORE MORNING MEAL FOR 90 DAYS      OZEMPIC 1 mg/dose (4 mg/3 mL) pen injector 1 mg Subcutaneous once a week for 30 days        No current facility-administered medications on file prior to visit.       Family History Family History Problem Relation Age of Onset  High blood pressure (Hypertension) Mother    Colon cancer Mother         Tobacco Use History Social History    Tobacco Use Smoking Status Never Smokeless Tobacco Never       Social History Social History    Socioeconomic History  Marital status: Married Tobacco Use  Smoking status: Never  Smokeless tobacco: Never Vaping Use  Vaping status: Never Used Substance and Sexual Activity  Alcohol use: Yes  Drug use: Never       Objective:      Vitals:   09/04/23 0918 09/04/23 0921 BP: 132/84   Pulse: 87   Temp: 36.7 C (98 F)   SpO2: 92%   Weight: (!) 112.9 kg (248 lb 12.8 oz)   Height: 177.8 cm (5\' 10" )  PainSc:   0-No pain     Exam Gen: NAD Abd: soft Significant cervical lymphadenopathy noted bilaterally along the anterior and posterior chains.  Large axillary lymph node and a smaller mobile axillary node noted in the right axilla.     Labs, Imaging and Diagnostic Testing: CT reports reviewed    Assessment and Plan: Diagnoses and all orders for this visit:   Adenopathy       72 year old male with a history of colon cancer who presents to the office with new lymphadenopathy noted.  CT scans suggest generalized lymphadenopathy throughout the entire body.  This is concerning for a possible lymphoma.  Lymph node biopsy was recommended.  Patient is somewhat symptomatic from his right axillary lymph node.  This appears superficial and therefore I have recommended excision of this lymph node for symptom relief as well as diagnosis.  We we will schedule this  for later this week as an urgent case.   Vanita Panda, MD Colon and Rectal Surgery [facility]

## 2023-09-05 ENCOUNTER — Encounter (HOSPITAL_BASED_OUTPATIENT_CLINIC_OR_DEPARTMENT_OTHER): Payer: Self-pay | Admitting: General Surgery

## 2023-09-05 NOTE — Progress Notes (Addendum)
Spoke w/ via phone for pre-op interview--- pt Lab needs dos----   [employer], ekg      Lab results------ no COVID test -----patient states asymptomatic no test needed Arrive at ------- 1230 on 09-08-2023 NPO after MN NO Solid Food.  Clear liquids from MN until--- 1130 Med rec completed Medications to take morning of surgery ----- lipitor, pirlosec Diabetic medication -----do not take jardiance, metformin morning of surgery.  Do half dose of tresiba morning of surgery.  Per pt was not given instructions about ozempic.  Pt does his ozempic on Monday's.  Pt stated did his dose yesterday Monday 09-04-2023. Patient instructed no nail polish to be worn day of surgery Patient instructed to bring photo id and insurance card day of surgery Patient aware to have Driver (ride ) / caregiver    for 24 hours after surgery - wife, felicia Patient Special Instructions ----- n/a Pre-Op special Instructions ----- n/a Patient verbalized understanding of instructions that were given at this phone interview. Patient denies chest pain, sob, fever, cough at the interview.   Chart reviewed w/ anesthesia Dr Kirtland Bouchard. [facility] MDA.  Dr Hart [place] MDA stated ok for pt to proceed with surgery even though he did his ozempic 09-04-2023, plan would be to do general anesthesia and intubate patient.

## 2023-09-06 ENCOUNTER — Telehealth: Payer: Self-pay

## 2023-09-06 NOTE — Telephone Encounter (Signed)
-----   Message from Lonna Cobb sent at 09/06/2023  3:15 PM EDT ----- Please cancel his appointment 09/08/2023.  Let him know we will reschedule once we have the results of the lymph node biopsy.

## 2023-09-06 NOTE — Telephone Encounter (Signed)
The patient appointment has been canceled, and he has been informed that we will contact him once the biopsy results are received.

## 2023-09-07 LAB — FLOW CYTOMETRY

## 2023-09-07 NOTE — Anesthesia Preprocedure Evaluation (Addendum)
Anesthesia Evaluation  Patient identified by MRN, date of birth, ID band Patient awake    Reviewed: Allergy & Precautions, NPO status , Patient's Chart, lab work & pertinent test results  History of Anesthesia Complications Negative for: history of anesthetic complications  Airway Mallampati: III  TM Distance: >3 FB Neck ROM: Full   Comment: Previous grade II view with MAC 4, easy mask Dental  (+) Dental Advisory Given,    Pulmonary neg shortness of breath, neg sleep apnea, neg COPD, neg recent URI, former smoker Pulmonary nodules   Pulmonary exam normal breath sounds clear to auscultation       Cardiovascular hypertension (losartan), Pt. on medications (-) angina (-) Past MI, (-) Cardiac Stents and (-) CABG (-) dysrhythmias  Rhythm:Regular Rate:Normal  HLD   Neuro/Psych negative neurological ROS     GI/Hepatic Neg liver ROS,GERD  Medicated,,H/o colon cancer   Endo/Other  diabetes, Type 2, Oral Hypoglycemic Agents    Renal/GU Renal disease (ectopic kidney)     Musculoskeletal  (+) Arthritis ,    Abdominal  (+) + obese  Peds  Hematology negative hematology ROS (+)   Anesthesia Other Findings Last Ozempic: 09/04/2023  Reproductive/Obstetrics                             Anesthesia Physical Anesthesia Plan  ASA: 3  Anesthesia Plan: General   Post-op Pain Management: Tylenol PO (pre-op)*   Induction: Intravenous and Rapid sequence  PONV Risk Score and Plan: 1 and Propofol infusion, Ondansetron and Treatment may vary due to age or medical condition  Airway Management Planned: Oral ETT  Additional Equipment:   Intra-op Plan:   Post-operative Plan: Extubation in OR  Informed Consent: I have reviewed the patients History and Physical, chart, labs and discussed the procedure including the risks, benefits and alternatives for the proposed anesthesia with the patient or authorized  representative who has indicated his/her understanding and acceptance.     Dental advisory given  Plan Discussed with: CRNA and Anesthesiologist  Anesthesia Plan Comments: (Risks of general anesthesia discussed including, but not limited to, sore throat, hoarse voice, chipped/damaged teeth, injury to vocal cords, nausea and vomiting, allergic reactions, lung infection, heart attack, stroke, and death. All questions answered.  )        Anesthesia Quick Evaluation

## 2023-09-08 ENCOUNTER — Other Ambulatory Visit: Payer: Self-pay

## 2023-09-08 ENCOUNTER — Inpatient Hospital Stay: Payer: 59 | Admitting: Nurse Practitioner

## 2023-09-08 ENCOUNTER — Ambulatory Visit (HOSPITAL_BASED_OUTPATIENT_CLINIC_OR_DEPARTMENT_OTHER): Payer: 59 | Admitting: Anesthesiology

## 2023-09-08 ENCOUNTER — Ambulatory Visit (HOSPITAL_BASED_OUTPATIENT_CLINIC_OR_DEPARTMENT_OTHER)
Admission: RE | Admit: 2023-09-08 | Discharge: 2023-09-08 | Disposition: A | Discharge status: Home / Self Care | Payer: 59 | Attending: General Surgery | Admitting: General Surgery

## 2023-09-08 ENCOUNTER — Encounter (HOSPITAL_BASED_OUTPATIENT_CLINIC_OR_DEPARTMENT_OTHER): Payer: Self-pay | Admitting: General Surgery

## 2023-09-08 ENCOUNTER — Encounter (HOSPITAL_BASED_OUTPATIENT_CLINIC_OR_DEPARTMENT_OTHER)
Admission: RE | Disposition: A | Discharge status: Home / Self Care | Payer: Self-pay | Source: Home / Self Care | Attending: General Surgery

## 2023-09-08 DIAGNOSIS — K219 Gastro-esophageal reflux disease without esophagitis: Secondary | ICD-10-CM | POA: Insufficient documentation

## 2023-09-08 DIAGNOSIS — E119 Type 2 diabetes mellitus without complications: Secondary | ICD-10-CM | POA: Diagnosis not present

## 2023-09-08 DIAGNOSIS — R599 Enlarged lymph nodes, unspecified: Secondary | ICD-10-CM | POA: Diagnosis not present

## 2023-09-08 DIAGNOSIS — Z85038 Personal history of other malignant neoplasm of large intestine: Secondary | ICD-10-CM | POA: Diagnosis not present

## 2023-09-08 DIAGNOSIS — Z01818 Encounter for other preprocedural examination: Secondary | ICD-10-CM

## 2023-09-08 DIAGNOSIS — R590 Localized enlarged lymph nodes: Secondary | ICD-10-CM | POA: Diagnosis not present

## 2023-09-08 DIAGNOSIS — Z7984 Long term (current) use of oral hypoglycemic drugs: Secondary | ICD-10-CM | POA: Insufficient documentation

## 2023-09-08 DIAGNOSIS — I898 Other specified noninfective disorders of lymphatic vessels and lymph nodes: Secondary | ICD-10-CM | POA: Insufficient documentation

## 2023-09-08 DIAGNOSIS — Z87891 Personal history of nicotine dependence: Secondary | ICD-10-CM | POA: Insufficient documentation

## 2023-09-08 DIAGNOSIS — Z7985 Long-term (current) use of injectable non-insulin antidiabetic drugs: Secondary | ICD-10-CM | POA: Diagnosis not present

## 2023-09-08 DIAGNOSIS — N189 Chronic kidney disease, unspecified: Secondary | ICD-10-CM | POA: Diagnosis not present

## 2023-09-08 DIAGNOSIS — I10 Essential (primary) hypertension: Secondary | ICD-10-CM | POA: Diagnosis not present

## 2023-09-08 DIAGNOSIS — E1122 Type 2 diabetes mellitus with diabetic chronic kidney disease: Secondary | ICD-10-CM | POA: Diagnosis not present

## 2023-09-08 DIAGNOSIS — I129 Hypertensive chronic kidney disease with stage 1 through stage 4 chronic kidney disease, or unspecified chronic kidney disease: Secondary | ICD-10-CM | POA: Diagnosis not present

## 2023-09-08 HISTORY — DX: Other nonspecific abnormal finding of lung field: R91.8

## 2023-09-08 HISTORY — DX: Presence of spectacles and contact lenses: Z97.3

## 2023-09-08 HISTORY — DX: Ectopic kidney: Q63.2

## 2023-09-08 HISTORY — DX: Adverse effect of antineoplastic and immunosuppressive drugs, initial encounter: T45.1X5A

## 2023-09-08 HISTORY — DX: Localized enlarged lymph nodes: R59.0

## 2023-09-08 HISTORY — PX: AXILLARY LYMPH NODE BIOPSY: SHX5737

## 2023-09-08 HISTORY — DX: Nocturia: R35.1

## 2023-09-08 HISTORY — DX: Type 2 diabetes mellitus without complications: E11.9

## 2023-09-08 HISTORY — DX: Type 2 diabetes mellitus without complications: Z79.4

## 2023-09-08 HISTORY — DX: Unspecified osteoarthritis, unspecified site: M19.90

## 2023-09-08 LAB — POCT I-STAT, CHEM 8
BUN: 16 mg/dL (ref 8–23)
Calcium, Ion: 1.25 mmol/L (ref 1.15–1.40)
Chloride: 104 mmol/L (ref 98–111)
Creatinine, Ser: 0.9 mg/dL (ref 0.61–1.24)
Glucose, Bld: 107 mg/dL — ABNORMAL HIGH (ref 70–99)
HCT: 55 % — ABNORMAL HIGH (ref 39.0–52.0)
Hemoglobin: 18.7 g/dL — ABNORMAL HIGH (ref 13.0–17.0)
Potassium: 4 mmol/L (ref 3.5–5.1)
Sodium: 142 mmol/L (ref 135–145)
TCO2: 22 mmol/L (ref 22–32)

## 2023-09-08 LAB — GLUCOSE, CAPILLARY: Glucose-Capillary: 110 mg/dL — ABNORMAL HIGH (ref 70–99)

## 2023-09-08 SURGERY — AXILLARY LYMPH NODE BIOPSY
Anesthesia: General | Laterality: Right

## 2023-09-08 MED ORDER — ACETAMINOPHEN 500 MG PO TABS: 1000.0000 mg | ORAL_TABLET | Freq: Once | ORAL | Status: DC

## 2023-09-08 MED ORDER — GLYCOPYRROLATE 0.2 MG/ML IJ SOLN
INTRAMUSCULAR | Status: DC | PRN
  Administered 2023-09-08: .2 mg via INTRAVENOUS

## 2023-09-08 MED ORDER — 0.9 % SODIUM CHLORIDE (POUR BTL) OPTIME
TOPICAL | Status: DC | PRN
  Administered 2023-09-08: 500 mL

## 2023-09-08 MED ORDER — SUCCINYLCHOLINE CHLORIDE 200 MG/10ML IV SOSY
PREFILLED_SYRINGE | INTRAVENOUS | Status: DC | PRN
  Administered 2023-09-08: 120 mg via INTRAVENOUS

## 2023-09-08 MED ORDER — PROPOFOL 10 MG/ML IV BOLUS
INTRAVENOUS | Status: DC | PRN
  Administered 2023-09-08: 200 mg via INTRAVENOUS

## 2023-09-08 MED ORDER — FENTANYL CITRATE (PF) 100 MCG/2ML IJ SOLN
INTRAMUSCULAR | Status: DC | PRN
  Administered 2023-09-08 (×4): 50 ug via INTRAVENOUS

## 2023-09-08 MED ORDER — PROPOFOL 10 MG/ML IV BOLUS
INTRAVENOUS | Status: AC
  Filled 2023-09-08: qty 20

## 2023-09-08 MED ORDER — SODIUM CHLORIDE 0.9% FLUSH: 10.0000 mL | Freq: Two times a day (BID) | INTRAVENOUS | Status: DC

## 2023-09-08 MED ORDER — LACTATED RINGERS IV SOLN: INTRAVENOUS | Status: DC

## 2023-09-08 MED ORDER — ACETAMINOPHEN 500 MG PO TABS
ORAL_TABLET | ORAL | Status: AC
  Filled 2023-09-08: qty 2

## 2023-09-08 MED ORDER — ONDANSETRON HCL 4 MG/2ML IJ SOLN
INTRAMUSCULAR | Status: AC
  Filled 2023-09-08: qty 2

## 2023-09-08 MED ORDER — ROCURONIUM BROMIDE 100 MG/10ML IV SOLN
INTRAVENOUS | Status: DC | PRN
  Administered 2023-09-08: 40 mg via INTRAVENOUS

## 2023-09-08 MED ORDER — ACETAMINOPHEN 500 MG PO TABS
1000.0000 mg | ORAL_TABLET | ORAL | Status: AC
  Administered 2023-09-08: 1000 mg via ORAL

## 2023-09-08 MED ORDER — CEFAZOLIN SODIUM-DEXTROSE 2-4 GM/100ML-% IV SOLN
INTRAVENOUS | Status: AC
  Filled 2023-09-08: qty 100

## 2023-09-08 MED ORDER — FENTANYL CITRATE (PF) 100 MCG/2ML IJ SOLN: 25.0000 ug | INTRAMUSCULAR | Status: DC | PRN

## 2023-09-08 MED ORDER — FENTANYL CITRATE (PF) 100 MCG/2ML IJ SOLN
INTRAMUSCULAR | Status: AC
  Filled 2023-09-08: qty 2

## 2023-09-08 MED ORDER — LIDOCAINE HCL (CARDIAC) PF 100 MG/5ML IV SOSY
PREFILLED_SYRINGE | INTRAVENOUS | Status: DC | PRN
  Administered 2023-09-08: 100 mg via INTRAVENOUS

## 2023-09-08 MED ORDER — ROCURONIUM BROMIDE 10 MG/ML (PF) SYRINGE
PREFILLED_SYRINGE | INTRAVENOUS | Status: AC
  Filled 2023-09-08: qty 10

## 2023-09-08 MED ORDER — OXYCODONE HCL 5 MG PO TABS: 5.0000 mg | ORAL_TABLET | Freq: Once | ORAL | Status: DC | PRN

## 2023-09-08 MED ORDER — LIDOCAINE HCL (PF) 2 % IJ SOLN
INTRAMUSCULAR | Status: AC
  Filled 2023-09-08: qty 5

## 2023-09-08 MED ORDER — SODIUM CHLORIDE 0.9% FLUSH: 3.0000 mL | Freq: Two times a day (BID) | INTRAVENOUS | Status: DC

## 2023-09-08 MED ORDER — AMISULPRIDE (ANTIEMETIC) 5 MG/2ML IV SOLN: 10.0000 mg | Freq: Once | INTRAVENOUS | Status: DC | PRN

## 2023-09-08 MED ORDER — SUCCINYLCHOLINE CHLORIDE 200 MG/10ML IV SOSY
PREFILLED_SYRINGE | INTRAVENOUS | Status: AC
  Filled 2023-09-08: qty 10

## 2023-09-08 MED ORDER — SUGAMMADEX SODIUM 200 MG/2ML IV SOLN
INTRAVENOUS | Status: DC | PRN
Start: 2023-09-08 — End: 2023-09-08
  Administered 2023-09-08: 50 mg via INTRAVENOUS
  Administered 2023-09-08: 200 mg via INTRAVENOUS

## 2023-09-08 MED ORDER — OXYCODONE HCL 5 MG/5ML PO SOLN: 5.0000 mg | Freq: Once | ORAL | Status: DC | PRN

## 2023-09-08 MED ORDER — ONDANSETRON HCL 4 MG/2ML IJ SOLN
INTRAMUSCULAR | Status: DC | PRN
  Administered 2023-09-08: 4 mg via INTRAVENOUS

## 2023-09-08 MED ORDER — TRAMADOL HCL 50 MG PO TABS
50.0000 mg | ORAL_TABLET | Freq: Four times a day (QID) | ORAL | 0 refills | Status: DC | PRN
Start: 2023-09-08 — End: 2023-09-22

## 2023-09-08 MED ORDER — BUPIVACAINE-EPINEPHRINE 0.5% -1:200000 IJ SOLN
INTRAMUSCULAR | Status: DC | PRN
  Administered 2023-09-08: 30 mL

## 2023-09-08 MED ORDER — LACTATED RINGERS IV SOLN
INTRAVENOUS | Status: DC | PRN
Start: 2023-09-08 — End: 2023-09-08

## 2023-09-08 MED ORDER — CEFAZOLIN SODIUM-DEXTROSE 2-4 GM/100ML-% IV SOLN
2.0000 g | INTRAVENOUS | Status: AC
  Administered 2023-09-08: 2 g via INTRAVENOUS

## 2023-09-08 MED ORDER — PROPOFOL 1000 MG/100ML IV EMUL
INTRAVENOUS | Status: AC
  Filled 2023-09-08: qty 100

## 2023-09-08 MED ORDER — GLYCOPYRROLATE PF 0.2 MG/ML IJ SOSY
PREFILLED_SYRINGE | INTRAMUSCULAR | Status: AC
  Filled 2023-09-08: qty 1

## 2023-09-08 SURGICAL SUPPLY — 48 items
ADH SKN CLS APL DERMABOND .7 (GAUZE/BANDAGES/DRESSINGS) ×2
APL PRP STRL LF DISP 70% ISPRP (MISCELLANEOUS) ×1
APL SKNCLS STERI-STRIP NONHPOA (GAUZE/BANDAGES/DRESSINGS)
BENZOIN TINCTURE PRP APPL 2/3 (GAUZE/BANDAGES/DRESSINGS) IMPLANT
BLADE CLIPPER SENSICLIP SURGIC (BLADE) IMPLANT
BLADE EXTENDED COATED 6.5IN (ELECTRODE) IMPLANT
BLADE SURG 10 STRL SS (BLADE) ×1 IMPLANT
CHLORAPREP W/TINT 26 (MISCELLANEOUS) ×1 IMPLANT
COVER BACK TABLE 60X90IN (DRAPES) ×1 IMPLANT
COVER MAYO STAND STRL (DRAPES) ×1 IMPLANT
DERMABOND ADVANCED .7 DNX12 (GAUZE/BANDAGES/DRESSINGS) IMPLANT
DRAPE LAPAROTOMY 100X72 PEDS (DRAPES) ×1 IMPLANT
DRAPE UTILITY XL STRL (DRAPES) ×1 IMPLANT
DRSG TEGADERM 4X4.75 (GAUZE/BANDAGES/DRESSINGS) IMPLANT
ELECT REM PT RETURN 9FT ADLT (ELECTROSURGICAL) ×1
ELECTRODE REM PT RTRN 9FT ADLT (ELECTROSURGICAL) ×1 IMPLANT
GAUZE 4X4 16PLY ~~LOC~~+RFID DBL (SPONGE) ×1 IMPLANT
GAUZE SPONGE 4X4 12PLY STRL (GAUZE/BANDAGES/DRESSINGS) ×1 IMPLANT
GLOVE BIO SURGEON STRL SZ 6.5 (GLOVE) ×2 IMPLANT
GLOVE BIOGEL PI IND STRL 7.0 (GLOVE) ×1 IMPLANT
GLOVE INDICATOR 6.5 STRL GRN (GLOVE) ×1 IMPLANT
KIT TURNOVER CYSTO (KITS) ×1 IMPLANT
NDL HYPO 22X1.5 SAFETY MO (MISCELLANEOUS) ×1 IMPLANT
NEEDLE HYPO 22X1.5 SAFETY MO (MISCELLANEOUS) ×1 IMPLANT
NS IRRIG 500ML POUR BTL (IV SOLUTION) IMPLANT
PACK BASIN DAY SURGERY FS (CUSTOM PROCEDURE TRAY) ×1 IMPLANT
PAD ARMBOARD 7.5X6 YLW CONV (MISCELLANEOUS) IMPLANT
PENCIL SMOKE EVACUATOR (MISCELLANEOUS) ×1 IMPLANT
SLEEVE SCD COMPRESS KNEE MED (STOCKING) ×1 IMPLANT
SPIKE FLUID TRANSFER (MISCELLANEOUS) IMPLANT
STRIP CLOSURE SKIN 1/2X4 (GAUZE/BANDAGES/DRESSINGS) IMPLANT
SUT ETHILON 2 0 FS 18 (SUTURE) IMPLANT
SUT ETHILON 4 0 PS 2 18 (SUTURE) IMPLANT
SUT SILK 2 0 SH (SUTURE) IMPLANT
SUT VIC AB 2-0 SH 27 (SUTURE) ×1
SUT VIC AB 2-0 SH 27XBRD (SUTURE) IMPLANT
SUT VIC AB 3-0 SH 18 (SUTURE) IMPLANT
SUT VIC AB 4-0 PS2 18 (SUTURE) IMPLANT
SUT VIC AB 4-0 SH 18 (SUTURE) IMPLANT
SWAB CULTURE ESWAB REG 1ML (MISCELLANEOUS) IMPLANT
SYR BULB IRRIG 60ML STRL (SYRINGE) ×1 IMPLANT
SYR CONTROL 10ML LL (SYRINGE) ×1 IMPLANT
TOWEL OR 17X24 6PK STRL BLUE (TOWEL DISPOSABLE) ×1 IMPLANT
TRAY DSU PREP LF (CUSTOM PROCEDURE TRAY) IMPLANT
TUBE CONNECTING 12X1/4 (SUCTIONS) ×1 IMPLANT
UNDERPAD 30X36 HEAVY ABSORB (UNDERPADS AND DIAPERS) IMPLANT
WATER STERILE IRR 500ML POUR (IV SOLUTION) ×1 IMPLANT
YANKAUER SUCT BULB TIP NO VENT (SUCTIONS) ×1 IMPLANT

## 2023-09-08 NOTE — Interval H&P Note (Signed)
History and Physical Interval Note:  09/08/2023 7:13 AM  Angel [name]  has presented today for surgery, with the diagnosis of LYMPHEDENOPATHY.  The various methods of treatment have been discussed with the patient and family. After consideration of risks, benefits and other options for treatment, the patient has consented to  Procedure(s): RIGHT AXILLARY LYMPH NODE EXCISIONAL BIOPSY (Right) as a surgical intervention.  The patient's history has been reviewed, patient examined, no change in status, stable for surgery.  I have reviewed the patient's chart and labs.  Questions were answered to the patient's satisfaction.     Vanita Panda, MD  Colorectal and General Surgery [facility]

## 2023-09-08 NOTE — Anesthesia Procedure Notes (Signed)
Procedure Name: Intubation Date/Time: 09/08/2023 7:44 AM  Performed by: Jessica Priest, CRNAPre-anesthesia Checklist: Patient identified, Emergency Drugs available, Suction available, Patient being monitored and Timeout performed Patient Re-evaluated:Patient Re-evaluated prior to induction Oxygen Delivery Method: Circle system utilized Preoxygenation: Pre-oxygenation with 100% oxygen Induction Type: IV induction Ventilation: Mask ventilation without difficulty Laryngoscope Size: Mac and 4 Grade View: Grade II Tube type: Oral Tube size: 7.5 mm Number of attempts: 1 Airway Equipment and Method: Stylet and Oral airway Placement Confirmation: ETT inserted through vocal cords under direct vision, positive ETCO2, breath sounds checked- equal and bilateral and CO2 detector Secured at: 24 cm Tube secured with: Tape Dental Injury: Teeth and Oropharynx as per pre-operative assessment

## 2023-09-08 NOTE — Transfer of Care (Signed)
Immediate Anesthesia Transfer of Care Note  Patient: Angel [name]  Procedure(s) Performed: Procedure(s) (LRB): RIGHT AXILLARY LYMPH NODE EXCISIONAL BIOPSY (Right)  Patient Location: PACU  Anesthesia Type: GA  Level of Consciousness: awake, sedated, patient cooperative and responds to stimulation  Airway & Oxygen Therapy: Patient Spontanous Breathing and Patient connected to [place] oxygen  Post-op Assessment: Report given to PACU RN, Post -op Vital signs reviewed and stable and Patient moving all extremities  Post vital signs: Reviewed and stable  Complications: No apparent anesthesia complications

## 2023-09-08 NOTE — Discharge Instructions (Addendum)
GENERAL SURGERY: POST OP INSTRUCTIONS  DIET: Follow a light bland diet the first 24 hours after arrival home, such as soup, liquids, crackers, etc.  Be sure to include lots of fluids daily.  Avoid fast food or heavy meals as your are more likely to get nauseated.   Take your usually prescribed home medications unless otherwise directed. PAIN CONTROL: Pain is best controlled by a usual combination of three different methods TOGETHER: Ice/Heat Over the counter pain medication Prescription pain medication Most patients will experience some swelling and bruising around the incisions.  Ice packs or heating pads (30-60 minutes up to 6 times a day) will help. Use ice for the first few days to help decrease swelling and bruising, then switch to heat to help relax tight/sore spots and speed recovery.  Some people prefer to use ice alone, heat alone, alternating between ice & heat.  Experiment to what works for you.  Swelling and bruising can take several weeks to resolve.   It is helpful to take an over-the-counter pain medication regularly for the first few weeks.  Choose one of the following that works best for you: Naproxen (Aleve, etc)  Two 220mg  tabs twice a day Ibuprofen (Advil, etc) Three 200mg  tabs four times a day (every meal & bedtime) A  prescription for pain medication (such as Percocet, oxycodone, hydrocodone, etc) should be given to you upon discharge.  Take your pain medication as prescribed.  If you are having problems/concerns with the prescription medicine (does not control pain, nausea, vomiting, rash, itching, etc), please call us [phone] to see if we need to switch you to a different pain medicine that will work better for you and/or control your side effect better. If you need a refill on your pain medication, please contact your pharmacy.  They will contact our office to request authorization. Prescriptions will not be filled after 5 pm or on week-ends. Avoid getting constipated.   Between the surgery and the pain medications, it is common to experience some constipation.  Increasing fluid intake and taking a fiber supplement (such as Metamucil, Citrucel, FiberCon, MiraLax, etc) 1-2 times a day regularly will usually help prevent this problem from occurring.  A mild laxative (prune juice, Milk of Magnesia, MiraLax, etc) should be taken according to package directions if there are no bowel movements after 48 hours.   Wash / shower every day.  You may shower over the dressings as they are waterproof.  Continue to shower over incision(s) after the dressing is off. Remove your waterproof bandages 5 days after surgery.  You may leave the incision open to air.  You may have skin tapes (Steri Strips) covering the incision(s).  Leave them on until one week, then remove.  You may replace a dressing/Band-Aid to cover the incision for comfort if you wish.      ACTIVITIES as tolerated:   You may resume regular (light) daily activities beginning the next day--such as daily self-care, walking, climbing stairs--gradually increasing activities as tolerated.  If you can walk 30 minutes without difficulty, it is safe to try more intense activity such as jogging, treadmill, bicycling, low-impact aerobics, swimming, etc. Save the most intensive and strenuous activity for last such as sit-ups, heavy lifting, contact sports, etc  Refrain from any heavy lifting or straining until you are off narcotics for pain control.   DO NOT PUSH THROUGH PAIN.  Let pain be your guide: If it hurts to do something, don't do it.  Pain is your  body warning you to avoid that activity for another week until the pain goes down. You may drive when you are no longer taking prescription pain medication, you can comfortably wear a seatbelt, and you can safely maneuver your car and apply brakes. You may have sexual intercourse when it is comfortable.  FOLLOW UP in our office Please call [facility] at [phone] to set up an  appointment to see your surgeon in the office for a follow-up appointment approximately 2-3 weeks after your surgery. Make sure that you call for this appointment the day you arrive home to insure a convenient appointment time. 9. IF YOU HAVE DISABILITY OR FAMILY LEAVE FORMS, BRING THEM TO THE OFFICE FOR PROCESSING.  DO NOT GIVE THEM TO YOUR DOCTOR.   WHEN TO CALL us [phone]: Poor pain control Reactions / problems with new medications (rash/itching, nausea, etc)  Fever over 101.5 F (38.5 C) Worsening swelling or bruising Continued bleeding from incision. Increased pain, redness, or drainage from the incision   The clinic staff is available to answer your questions during regular business hours (8:30am-5pm).  Please don't hesitate to call and ask to speak to one of our nurses for clinical concerns.   If you have a medical emergency, go to the nearest emergency room or call 911.  A surgeon from [facility] is always on call at the [facility], [place] [address], [address], [place], [place]  [address] ? MAIN: [phone] ? TOLL FREE: [phone] ?  FAX [phone] [email or web address]           No acetaminophen/Tylenol until after 12:00 pm today if needed.     Post Anesthesia Home Care Instructions  Activity: Get plenty of rest for the remainder of the day. A responsible individual must stay with you for 24 hours following the procedure.  For the next 24 hours, DO NOT: -Drive a car -Advertising copywriter -Drink alcoholic beverages -Take any medication unless instructed by your physician -Make any legal decisions or sign important papers.  Meals: Start with liquid foods such as gelatin or soup. Progress to regular foods as tolerated. Avoid greasy, spicy, heavy foods. If nausea and/or vomiting occur, drink only clear liquids until the nausea and/or vomiting subsides. Call your physician if vomiting  continues.  Special Instructions/Symptoms: Your throat may feel dry or sore from the anesthesia or the breathing tube placed in your throat during surgery. If this causes discomfort, gargle with warm salt water. The discomfort should disappear within 24 hours.

## 2023-09-08 NOTE — Anesthesia Postprocedure Evaluation (Signed)
Anesthesia Post Note  Patient: Angel [name]  Procedure(s) Performed: RIGHT AXILLARY LYMPH NODE EXCISIONAL BIOPSY (Right)     Patient location during evaluation: PACU Anesthesia Type: General Level of consciousness: awake Pain management: pain level controlled Vital Signs Assessment: post-procedure vital signs reviewed and stable Respiratory status: spontaneous breathing, nonlabored ventilation and respiratory function stable Cardiovascular status: blood pressure returned to baseline and stable Postop Assessment: no apparent nausea or vomiting Anesthetic complications: no   No notable events documented.  Last Vitals:  Vitals:   09/08/23 0900 09/08/23 0930  BP: 135/69 134/82  Pulse: (!) 106 96  Resp: 15 14  Temp:  36.6 C  SpO2: 90% 94%    Last Pain:  Vitals:   09/08/23 0930  TempSrc:   PainSc: 0-No pain                 Linton Rump

## 2023-09-08 NOTE — Op Note (Signed)
09/08/2023  8:22 AM  PATIENT:  Angel [name]  72 y.o. male  Patient Care Team: Renford Dills, MD as PCP - General (Internal Medicine)  PRE-OPERATIVE DIAGNOSIS:  Leslye Peer  POST-OPERATIVE DIAGNOSIS:  LYMPHEDENOPATHY  PROCEDURE:  RIGHT AXILLARY LYMPH NODE EXCISIONAL BIOPSY    Surgeon(s): Romie Levee, MD  ASSISTANT: none   ANESTHESIA:   local and general  EBL:72ml  Total I/O In: -  Out: 2 [Blood:2]  DRAINS: none   SPECIMEN:  Source of Specimen:  R axillary lymph node  DISPOSITION OF SPECIMEN:  PATHOLOGY  COUNTS:  YES  PLAN OF CARE: Discharge to home after PACU  PATIENT DISPOSITION:  PACU - hemodynamically stable.  INDICATION: 72 y.o. M with generalized lymphadenopathy in need of lymph node biopsy.   OR FINDINGS: palpable R axillary node  DESCRIPTION: the patient was identified in the preoperative holding area and taken to the OR where they were laid supine on the operating room table.  General anesthesia was induced without difficulty. SCDs were also noted to be in place prior to the initiation of anesthesia.  The patient was then prepped and draped in the usual sterile fashion.   A surgical timeout was performed indicating the correct patient, procedure, positioning and need for preoperative antibiotics.   I began by making an incision in the right axilla using 15 blade scalpel.  This was carried down through the skin and cutaneous levels to the level of the axillary subcutaneous tissue.  The lymph node was palpated and elevated using a suture.  I dissected around the lymph node using electrocautery.  The lymph node continued deeper into the subcutaneous tissues.  I tied this off with a 2-0 Vicryl suture.  The specimen was then sent to pathology for further examination.  There seem to be a pocket of necrosis in part of the lymph node.  This was also sent to pathology.  The axillary tissue was then reapproximated using interrupted 3-0 Vicryl suture.  3-0 Vicryl  suture was used to close the dermal layer in an interrupted fashion.  A 4-0 Vicryl subcuticular suture was then used to close the skin and Dermabond was placed over this.  The patient was then awakened from anesthesia and sent to the postanesthesia care unit in stable condition.  All counts were correct per operating room staff.  Vanita Panda, MD  Colorectal and General Surgery [facility]

## 2023-09-11 ENCOUNTER — Encounter (HOSPITAL_BASED_OUTPATIENT_CLINIC_OR_DEPARTMENT_OTHER): Payer: Self-pay | Admitting: General Surgery

## 2023-09-11 LAB — SURGICAL PATHOLOGY

## 2023-09-12 LAB — SURGICAL PATHOLOGY

## 2023-09-15 ENCOUNTER — Ambulatory Visit: Payer: Medicare Other | Admitting: Nurse Practitioner

## 2023-09-18 ENCOUNTER — Encounter (HOSPITAL_BASED_OUTPATIENT_CLINIC_OR_DEPARTMENT_OTHER): Payer: Self-pay | Admitting: General Surgery

## 2023-09-18 ENCOUNTER — Ambulatory Visit: Payer: Self-pay | Admitting: General Surgery

## 2023-09-18 NOTE — Progress Notes (Addendum)
Spoke w/ via phone for pre-op interview--- [place] needs dos---- ISTAT per anesthesia, surgeon orders pending.       Lab results------Current EKG in Epic dated 09/08/23  COVID test -----patient states asymptomatic no test needed Arrive at -------0900 NPO after MN NO Solid Food.  Clear liquids from MN until---0800 Med rec completed Medications to take morning of surgery -----Lipitor, Prilosec Diabetic medication -----NO DM medications AM of surgery. Patient instructed no nail polish to be worn day of surgery Patient instructed to bring photo id and insurance card day of surgery Patient aware to have Driver (ride ) / caregiver    for 24 hours after surgery - Wife Delray Alt Patient Special Instructions ----- Pre-Op special Instructions ----- Patient verbalized understanding of instructions that were given at this phone interview. Patient denies chest pain, sob, fever, cough at the interview.   Patient takes Ozempic last dose 09/11/23. Pt verbalized understanding to not take another dose until after procedure.

## 2023-09-22 ENCOUNTER — Ambulatory Visit (HOSPITAL_BASED_OUTPATIENT_CLINIC_OR_DEPARTMENT_OTHER): Payer: 59 | Admitting: Anesthesiology

## 2023-09-22 ENCOUNTER — Encounter (HOSPITAL_BASED_OUTPATIENT_CLINIC_OR_DEPARTMENT_OTHER)
Admission: RE | Disposition: A | Discharge status: Home / Self Care | Payer: Self-pay | Source: Home / Self Care | Attending: General Surgery

## 2023-09-22 ENCOUNTER — Other Ambulatory Visit: Payer: Self-pay

## 2023-09-22 ENCOUNTER — Ambulatory Visit (HOSPITAL_BASED_OUTPATIENT_CLINIC_OR_DEPARTMENT_OTHER)
Admission: RE | Admit: 2023-09-22 | Discharge: 2023-09-22 | Disposition: A | Discharge status: Home / Self Care | Payer: 59 | Attending: General Surgery | Admitting: General Surgery

## 2023-09-22 ENCOUNTER — Encounter (HOSPITAL_BASED_OUTPATIENT_CLINIC_OR_DEPARTMENT_OTHER): Payer: Self-pay | Admitting: General Surgery

## 2023-09-22 DIAGNOSIS — K219 Gastro-esophageal reflux disease without esophagitis: Secondary | ICD-10-CM | POA: Insufficient documentation

## 2023-09-22 DIAGNOSIS — C8514 Unspecified B-cell lymphoma, lymph nodes of axilla and upper limb: Secondary | ICD-10-CM | POA: Insufficient documentation

## 2023-09-22 DIAGNOSIS — Z85038 Personal history of other malignant neoplasm of large intestine: Secondary | ICD-10-CM | POA: Diagnosis not present

## 2023-09-22 DIAGNOSIS — Z9049 Acquired absence of other specified parts of digestive tract: Secondary | ICD-10-CM | POA: Diagnosis not present

## 2023-09-22 DIAGNOSIS — R591 Generalized enlarged lymph nodes: Secondary | ICD-10-CM | POA: Diagnosis not present

## 2023-09-22 DIAGNOSIS — E669 Obesity, unspecified: Secondary | ICD-10-CM | POA: Insufficient documentation

## 2023-09-22 DIAGNOSIS — Z7984 Long term (current) use of oral hypoglycemic drugs: Secondary | ICD-10-CM | POA: Insufficient documentation

## 2023-09-22 DIAGNOSIS — E1122 Type 2 diabetes mellitus with diabetic chronic kidney disease: Secondary | ICD-10-CM | POA: Diagnosis not present

## 2023-09-22 DIAGNOSIS — I129 Hypertensive chronic kidney disease with stage 1 through stage 4 chronic kidney disease, or unspecified chronic kidney disease: Secondary | ICD-10-CM | POA: Diagnosis not present

## 2023-09-22 DIAGNOSIS — Z01818 Encounter for other preprocedural examination: Secondary | ICD-10-CM

## 2023-09-22 DIAGNOSIS — E119 Type 2 diabetes mellitus without complications: Secondary | ICD-10-CM | POA: Diagnosis not present

## 2023-09-22 DIAGNOSIS — R590 Localized enlarged lymph nodes: Secondary | ICD-10-CM | POA: Diagnosis not present

## 2023-09-22 DIAGNOSIS — I10 Essential (primary) hypertension: Secondary | ICD-10-CM | POA: Diagnosis not present

## 2023-09-22 DIAGNOSIS — J449 Chronic obstructive pulmonary disease, unspecified: Secondary | ICD-10-CM | POA: Diagnosis not present

## 2023-09-22 DIAGNOSIS — R599 Enlarged lymph nodes, unspecified: Secondary | ICD-10-CM

## 2023-09-22 DIAGNOSIS — N189 Chronic kidney disease, unspecified: Secondary | ICD-10-CM

## 2023-09-22 DIAGNOSIS — Z6835 Body mass index (BMI) 35.0-35.9, adult: Secondary | ICD-10-CM | POA: Diagnosis not present

## 2023-09-22 DIAGNOSIS — Z7985 Long-term (current) use of injectable non-insulin antidiabetic drugs: Secondary | ICD-10-CM | POA: Diagnosis not present

## 2023-09-22 DIAGNOSIS — C8334 Diffuse large B-cell lymphoma, lymph nodes of axilla and upper limb: Secondary | ICD-10-CM | POA: Diagnosis not present

## 2023-09-22 HISTORY — PX: LYMPH NODE BIOPSY: SHX201

## 2023-09-22 LAB — POCT I-STAT, CHEM 8
BUN: 13 mg/dL (ref 8–23)
Calcium, Ion: 1.25 mmol/L (ref 1.15–1.40)
Chloride: 102 mmol/L (ref 98–111)
Creatinine, Ser: 0.9 mg/dL (ref 0.61–1.24)
Glucose, Bld: 115 mg/dL — ABNORMAL HIGH (ref 70–99)
HCT: 52 % (ref 39.0–52.0)
Hemoglobin: 17.7 g/dL — ABNORMAL HIGH (ref 13.0–17.0)
Potassium: 4.2 mmol/L (ref 3.5–5.1)
Sodium: 140 mmol/L (ref 135–145)
TCO2: 24 mmol/L (ref 22–32)

## 2023-09-22 LAB — GLUCOSE, CAPILLARY: Glucose-Capillary: 127 mg/dL — ABNORMAL HIGH (ref 70–99)

## 2023-09-22 SURGERY — LYMPH NODE BIOPSY
Anesthesia: Monitor Anesthesia Care | Site: Axilla | Laterality: Left

## 2023-09-22 MED ORDER — SODIUM CHLORIDE 0.9% FLUSH: 3.0000 mL | Freq: Two times a day (BID) | INTRAVENOUS | Status: DC

## 2023-09-22 MED ORDER — ONDANSETRON HCL 4 MG/2ML IJ SOLN
INTRAMUSCULAR | Status: DC | PRN
  Administered 2023-09-22: 4 mg via INTRAVENOUS

## 2023-09-22 MED ORDER — BUPIVACAINE-EPINEPHRINE 0.5% -1:200000 IJ SOLN
INTRAMUSCULAR | Status: DC | PRN
  Administered 2023-09-22: 30 mL

## 2023-09-22 MED ORDER — SODIUM CHLORIDE 0.9 % IV SOLN: INTRAVENOUS | Status: DC

## 2023-09-22 MED ORDER — PROPOFOL 10 MG/ML IV BOLUS
INTRAVENOUS | Status: AC
  Filled 2023-09-22: qty 20

## 2023-09-22 MED ORDER — 0.9 % SODIUM CHLORIDE (POUR BTL) OPTIME
TOPICAL | Status: DC | PRN
  Administered 2023-09-22: 500 mL

## 2023-09-22 MED ORDER — STERILE WATER FOR IRRIGATION IR SOLN
Status: DC | PRN
  Administered 2023-09-22: 500 mL

## 2023-09-22 MED ORDER — OXYCODONE HCL 5 MG/5ML PO SOLN: 5.0000 mg | Freq: Once | ORAL | Status: DC | PRN

## 2023-09-22 MED ORDER — ONDANSETRON HCL 4 MG/2ML IJ SOLN: 4.0000 mg | Freq: Once | INTRAMUSCULAR | Status: DC | PRN

## 2023-09-22 MED ORDER — ONDANSETRON HCL 4 MG/2ML IJ SOLN
INTRAMUSCULAR | Status: AC
  Filled 2023-09-22: qty 2

## 2023-09-22 MED ORDER — CEFAZOLIN SODIUM-DEXTROSE 2-4 GM/100ML-% IV SOLN
INTRAVENOUS | Status: AC
  Filled 2023-09-22: qty 100

## 2023-09-22 MED ORDER — OXYCODONE HCL 5 MG PO TABS: 5.0000 mg | ORAL_TABLET | Freq: Once | ORAL | Status: DC | PRN

## 2023-09-22 MED ORDER — FENTANYL CITRATE (PF) 100 MCG/2ML IJ SOLN
INTRAMUSCULAR | Status: AC
  Filled 2023-09-22: qty 2

## 2023-09-22 MED ORDER — FENTANYL CITRATE (PF) 100 MCG/2ML IJ SOLN
25.0000 ug | INTRAMUSCULAR | Status: DC | PRN
  Administered 2023-09-22 (×2): 25 ug via INTRAVENOUS

## 2023-09-22 MED ORDER — TRAMADOL HCL 50 MG PO TABS
50.0000 mg | ORAL_TABLET | Freq: Four times a day (QID) | ORAL | 0 refills | Status: DC | PRN
Start: 2023-09-22 — End: 2023-11-03

## 2023-09-22 MED ORDER — FENTANYL CITRATE (PF) 100 MCG/2ML IJ SOLN
INTRAMUSCULAR | Status: DC | PRN
  Administered 2023-09-22 (×2): 25 ug via INTRAVENOUS

## 2023-09-22 MED ORDER — CEFAZOLIN SODIUM-DEXTROSE 2-4 GM/100ML-% IV SOLN
2.0000 g | INTRAVENOUS | Status: AC
  Administered 2023-09-22: 2 g via INTRAVENOUS

## 2023-09-22 MED ORDER — PROPOFOL 500 MG/50ML IV EMUL
INTRAVENOUS | Status: DC | PRN
  Administered 2023-09-22: 100 ug/kg/min via INTRAVENOUS

## 2023-09-22 MED ORDER — LIDOCAINE HCL (PF) 2 % IJ SOLN
INTRAMUSCULAR | Status: AC
  Filled 2023-09-22: qty 5

## 2023-09-22 MED ORDER — LACTATED RINGERS IV SOLN: INTRAVENOUS | Status: DC

## 2023-09-22 MED ORDER — PROPOFOL 1000 MG/100ML IV EMUL
INTRAVENOUS | Status: AC
  Filled 2023-09-22: qty 100

## 2023-09-22 SURGICAL SUPPLY — 40 items
ADH SKN CLS APL DERMABOND .7 (GAUZE/BANDAGES/DRESSINGS) ×2
APL PRP STRL LF DISP 70% ISPRP (MISCELLANEOUS) ×1
BLADE SURG 15 STRL LF DISP TIS (BLADE) ×1 IMPLANT
BLADE SURG 15 STRL SS (BLADE) ×1
CHLORAPREP W/TINT 26 (MISCELLANEOUS) IMPLANT
COVER BACK TABLE 60X90IN (DRAPES) ×1 IMPLANT
COVER MAYO STAND STRL (DRAPES) ×1 IMPLANT
DERMABOND ADVANCED .7 DNX12 (GAUZE/BANDAGES/DRESSINGS) ×1 IMPLANT
DRAPE HALF SHEET 40X57 (DRAPES) IMPLANT
DRAPE LAPAROTOMY 100X72 PEDS (DRAPES) ×1 IMPLANT
DRAPE SHEET LG 3/4 BI-LAMINATE (DRAPES) IMPLANT
DRAPE UTILITY XL STRL (DRAPES) ×1 IMPLANT
ELECT REM PT RETURN 9FT ADLT (ELECTROSURGICAL) ×1
ELECTRODE REM PT RTRN 9FT ADLT (ELECTROSURGICAL) ×1 IMPLANT
GLOVE BIO SURGEON STRL SZ 6 (GLOVE) ×1 IMPLANT
GLOVE INDICATOR 6.5 STRL GRN (GLOVE) ×1 IMPLANT
GOWN STRL REUS W/ TWL LRG LVL3 (GOWN DISPOSABLE) ×1 IMPLANT
GOWN STRL REUS W/TWL LRG LVL3 (GOWN DISPOSABLE) ×1
HEMOSTAT SURGICEL 2X14 (HEMOSTASIS) IMPLANT
KIT TURNOVER CYSTO (KITS) ×1 IMPLANT
MARKER SKIN DUAL TIP RULER LAB (MISCELLANEOUS) ×1 IMPLANT
NDL HYPO 22X1.5 SAFETY MO (MISCELLANEOUS) ×1 IMPLANT
NEEDLE HYPO 22X1.5 SAFETY MO (MISCELLANEOUS) ×1
PACK BASIN DAY SURGERY FS (CUSTOM PROCEDURE TRAY) ×1 IMPLANT
PENCIL SMOKE EVACUATOR (MISCELLANEOUS) ×1 IMPLANT
SLEEVE SCD COMPRESS KNEE MED (STOCKING) ×1 IMPLANT
SPIKE FLUID TRANSFER (MISCELLANEOUS) ×1 IMPLANT
SPONGE T-LAP 4X18 ~~LOC~~+RFID (SPONGE) ×1 IMPLANT
SUCTION TUBE FRAZIER 10FR DISP (SUCTIONS) IMPLANT
SUT MNCRL AB 4-0 PS2 18 (SUTURE) ×1 IMPLANT
SUT VIC AB 3-0 SH 27 (SUTURE) ×1
SUT VIC AB 3-0 SH 27X BRD (SUTURE) ×1 IMPLANT
SUT VICRYL 0 TIES 12 18 (SUTURE) ×1 IMPLANT
SUT VICRYL 2-0 SH 8X27 (SUTURE) IMPLANT
SYR BULB IRRIG 60ML STRL (SYRINGE) ×1 IMPLANT
SYR CONTROL 10ML LL (SYRINGE) ×1 IMPLANT
TOWEL OR 17X24 6PK STRL BLUE (TOWEL DISPOSABLE) ×1 IMPLANT
TRAY DSU PREP LF (CUSTOM PROCEDURE TRAY) IMPLANT
TUBE CONNECTING 12X1/4 (SUCTIONS) ×1 IMPLANT
YANKAUER SUCT BULB TIP NO VENT (SUCTIONS) ×1 IMPLANT

## 2023-09-22 NOTE — Anesthesia Procedure Notes (Signed)
Procedure Name: MAC Date/Time: 09/22/2023 10:32 AM  Performed by: Jessica Priest, CRNAPre-anesthesia Checklist: Patient identified, Emergency Drugs available, Suction available, Patient being monitored and Timeout performed Patient Re-evaluated:Patient Re-evaluated prior to induction Oxygen Delivery Method: Simple face mask Preoxygenation: Pre-oxygenation with 100% oxygen Induction Type: IV induction Placement Confirmation: breath sounds checked- equal and bilateral, CO2 detector and positive ETCO2

## 2023-09-22 NOTE — Interval H&P Note (Signed)
History and Physical Interval Note:  09/22/2023 8:10 AM  Angel [name]  has presented today for surgery, with the diagnosis of LYMPHADENOPATHY.  The various methods of treatment have been discussed with the patient and family. Most recent lymph node biopsy was not diagnostic.  Discussed with Dr Truett Perna and we have decided to repeat this.  After consideration of risks, benefits and other options for treatment, the patient has consented to  Procedure(s) with comments: LYMPH NODE BIOPSY (N/A) - 47 as a surgical intervention.  The patient's history has been reviewed, patient examined, no change in status, stable for surgery.  I have reviewed the patient's chart and labs.  Questions were answered to the patient's satisfaction.     Vanita Panda, MD  Colorectal and General Surgery [facility]

## 2023-09-22 NOTE — Anesthesia Postprocedure Evaluation (Signed)
Anesthesia Post Note  Patient: JEFFRI CANELA  Procedure(s) Performed: LYMPH NODE BIOPSY LEFT AXILLA (Left: Axilla)     Patient location during evaluation: PACU Anesthesia Type: MAC Level of consciousness: awake and alert and oriented Pain management: pain level controlled Vital Signs Assessment: post-procedure vital signs reviewed and stable Respiratory status: spontaneous breathing, nonlabored ventilation and respiratory function stable Cardiovascular status: stable and blood pressure returned to baseline Postop Assessment: no apparent nausea or vomiting Anesthetic complications: no   No notable events documented.  Last Vitals:  Vitals:   09/22/23 1230 09/22/23 1239  BP: 114/68 114/77  Pulse: 77 74  Resp: 14 17  Temp:    SpO2: 96% 92%    Last Pain:  Vitals:   09/22/23 1239  TempSrc:   PainSc: 2                  Ader Fritze A.

## 2023-09-22 NOTE — Discharge Instructions (Addendum)
GENERAL SURGERY: POST OP INSTRUCTIONS  DIET: Follow a light bland diet the first 24 hours after arrival home, such as soup, liquids, crackers, etc.  Be sure to include lots of fluids daily.  Avoid fast food or heavy meals as your are more likely to get nauseated.   Take your usually prescribed home medications unless otherwise directed. PAIN CONTROL: Pain is best controlled by a usual combination of three different methods TOGETHER: Ice/Heat Over the counter pain medication Prescription pain medication Most patients will experience some swelling and bruising around the incisions.  Ice packs or heating pads (30-60 minutes up to 6 times a day) will help. Use ice for the first few days to help decrease swelling and bruising, then switch to heat to help relax tight/sore spots and speed recovery.  Some people prefer to use ice alone, heat alone, alternating between ice & heat.  Experiment to what works for you.  Swelling and bruising can take several weeks to resolve.   It is helpful to take an over-the-counter pain medication regularly for the first few weeks.  Choose one of the following that works best for you: Naproxen (Aleve, etc)  Two 220mg  tabs twice a day Ibuprofen (Advil, etc) Three 200mg  tabs four times a day (every meal & bedtime) A  prescription for pain medication (such as Percocet, oxycodone, hydrocodone, etc) should be given to you upon discharge.  Take your pain medication as prescribed.  If you are having problems/concerns with the prescription medicine (does not control pain, nausea, vomiting, rash, itching, etc), please call us [phone] to see if we need to switch you to a different pain medicine that will work better for you and/or control your side effect better. If you need a refill on your pain medication, please contact your pharmacy.  They will contact our office to request authorization. Prescriptions will not be filled after 5 pm or on week-ends. Avoid getting constipated.   Between the surgery and the pain medications, it is common to experience some constipation.  Increasing fluid intake and taking a fiber supplement (such as Metamucil, Citrucel, FiberCon, MiraLax, etc) 1-2 times a day regularly will usually help prevent this problem from occurring.  A mild laxative (prune juice, Milk of Magnesia, MiraLax, etc) should be taken according to package directions if there are no bowel movements after 48 hours.   Wash / shower every day.  You may shower over the dressings as they are waterproof.  Continue to shower over incision(s) after the dressing is off. Remove your waterproof bandages 5 days after surgery.  You may leave the incision open to air.  You may have skin tapes (Steri Strips) covering the incision(s).  Leave them on until one week, then remove.  You may replace a dressing/Band-Aid to cover the incision for comfort if you wish.      ACTIVITIES as tolerated:   You may resume regular (light) daily activities beginning the next day--such as daily self-care, walking, climbing stairs--gradually increasing activities as tolerated.  If you can walk 30 minutes without difficulty, it is safe to try more intense activity such as jogging, treadmill, bicycling, low-impact aerobics, swimming, etc. Save the most intensive and strenuous activity for last such as sit-ups, heavy lifting, contact sports, etc  Refrain from any heavy lifting or straining until you are off narcotics for pain control.   DO NOT PUSH THROUGH PAIN.  Let pain be your guide: If it hurts to do something, don't do it.  Pain is your  body warning you to avoid that activity for another week until the pain goes down. You may drive when you are no longer taking prescription pain medication, you can comfortably wear a seatbelt, and you can safely maneuver your car and apply brakes. You may have sexual intercourse when it is comfortable.  FOLLOW UP in our office Please call [facility] at [phone] to set up an  appointment to see your surgeon in the office for a follow-up appointment approximately 2-3 weeks after your surgery. Make sure that you call for this appointment the day you arrive home to insure a convenient appointment time. 9. IF YOU HAVE DISABILITY OR FAMILY LEAVE FORMS, BRING THEM TO THE OFFICE FOR PROCESSING.  DO NOT GIVE THEM TO YOUR DOCTOR.   WHEN TO CALL us [phone]: Poor pain control Reactions / problems with new medications (rash/itching, nausea, etc)  Fever over 101.5 F (38.5 C) Worsening swelling or bruising Continued bleeding from incision. Increased pain, redness, or drainage from the incision   The clinic staff is available to answer your questions during regular business hours (8:30am-5pm).  Please don't hesitate to call and ask to speak to one of our nurses for clinical concerns.   If you have a medical emergency, go to the nearest emergency room or call 911.  A surgeon from [facility] is always on call at the [facility], [place] [address], [address], [place], [place]  [address] ? MAIN: [phone] ? TOLL FREE: [phone] ?  FAX [phone] [email or web address]     Post Anesthesia Home Care Instructions  Activity: Get plenty of rest for the remainder of the day. A responsible individual must stay with you for 24 hours following the procedure.  For the next 24 hours, DO NOT: -Drive a car -Advertising copywriter -Drink alcoholic beverages -Take any medication unless instructed by your physician -Make any legal decisions or sign important papers.  Meals: Start with liquid foods such as gelatin or soup. Progress to regular foods as tolerated. Avoid greasy, spicy, heavy foods. If nausea and/or vomiting occur, drink only clear liquids until the nausea and/or vomiting subsides. Call your physician if vomiting continues.  Special Instructions/Symptoms: Your throat may feel dry or sore from the  anesthesia or the breathing tube placed in your throat during surgery. If this causes discomfort, gargle with warm salt water. The discomfort should disappear within 24 hours.

## 2023-09-22 NOTE — Anesthesia Preprocedure Evaluation (Signed)
Anesthesia Evaluation  Patient identified by MRN, date of birth, ID band Patient awake    Reviewed: Allergy & Precautions, NPO status , Patient's Chart, lab work & pertinent test results, reviewed documented beta blocker date and time   History of Anesthesia Complications Negative for: history of anesthetic complications  Airway Mallampati: III  TM Distance: >3 FB Neck ROM: Full   Comment: Previous grade II view with MAC 4, easy mask Dental  (+) Dental Advisory Given,    Pulmonary neg shortness of breath, neg sleep apnea, neg COPD, neg recent URI, former smoker Pulmonary nodules   Pulmonary exam normal breath sounds clear to auscultation       Cardiovascular hypertension, Pt. on medications (-) angina (-) Past MI, (-) Cardiac Stents and (-) CABG (-) dysrhythmias  Rhythm:Regular Rate:Normal  HLD   Neuro/Psych negative neurological ROS  negative psych ROS   GI/Hepatic Neg liver ROS,GERD  Medicated,,H/o colon cancer   Endo/Other  diabetes, Well Controlled, Type 2, Oral Hypoglycemic Agents  GLP-1 RA therapy Obesity HLD  Renal/GU Renal disease (ectopic kidney)     Musculoskeletal  (+) Arthritis , Osteoarthritis,    Abdominal  (+) + obese  Peds  Hematology negative hematology ROS (+)   Anesthesia Other Findings   Reproductive/Obstetrics                              Anesthesia Physical Anesthesia Plan  ASA: 3  Anesthesia Plan: MAC   Post-op Pain Management: Tylenol PO (pre-op)*   Induction: Intravenous  PONV Risk Score and Plan: 1 and 2 and Propofol infusion, Ondansetron and Treatment may vary due to age or medical condition  Airway Management Planned: Natural Airway and Simple Face Mask  Additional Equipment: None  Intra-op Plan:   Post-operative Plan:   Informed Consent: I have reviewed the patients History and Physical, chart, labs and discussed the procedure including the  risks, benefits and alternatives for the proposed anesthesia with the patient or authorized representative who has indicated his/her understanding and acceptance.     Dental advisory given  Plan Discussed with: CRNA and Anesthesiologist  Anesthesia Plan Comments: (  )         Anesthesia Quick Evaluation

## 2023-09-22 NOTE — Op Note (Signed)
09/22/2023  11:59 AM  PATIENT:  Angel [name]  72 y.o. male  Patient Care Team: Renford Dills, MD as PCP - General (Internal Medicine)  PRE-OPERATIVE DIAGNOSIS:  LYMPHADENOPATHY  POST-OPERATIVE DIAGNOSIS:  LYMPHADENOPATHY  PROCEDURE:   LYMPH NODE EXCISIONAL BIOPSY    Surgeon(s): Romie Levee, MD  ASSISTANT: none   ANESTHESIA:   local and MAC  EBL:  Total I/O In: 350 [I.V.:350] Out: 10 [Blood:10]  DRAINS: none   SPECIMEN:  Source of Specimen:  L axillary lymph node  DISPOSITION OF SPECIMEN:  PATHOLOGY  COUNTS:  YES  PLAN OF CARE: Discharge to home after PACU  PATIENT DISPOSITION:  PACU - hemodynamically stable.  INDICATION: 72 y.o. M with lymphadenopathy.  We attempted an excisional biopsy approximately 2 weeks ago and tissue was nondiagnostic.  His oncologist has asked that we try to excise another lymph node.   OR FINDINGS: Lymphadenopathy  DESCRIPTION: the patient was identified in the preoperative holding area and taken to the OR where they were laid supine on the operating room table.  MAC anesthesia was induced without difficulty. SCDs were also noted to be in place prior to the initiation of anesthesia.  The patient was then prepped and draped in the usual sterile fashion.   A surgical timeout was performed indicating the correct patient, procedure, positioning and need for preoperative antibiotics.   I began by reopening his previous incision in the right axilla.  I took this down through the subcutaneous tissues and into the axillary pocket.  I evaluated this for approximately 20 minutes but was unable to locate any other enlarged lymph nodes in this area.  I decided to switch to the other side.  I closed the subcutaneous tissue with 2-0 and 3-0 Vicryl sutures.  The skin was closed with 4-0 Vicryl suture and Dermabond was applied.  We then switched to the other side and this was prepped in a sterile fashion.  I identified the lymph node and placed a field  block around this area.  An incision was made over the lymph node using a 15 blade scalpel.  Dissection was carried down through the subcutaneous tissues and into the axillary pocket using electrocautery.  I then dissected out a large lymph node bluntly.  During dissection the lymph node broke in half.  The pieces were removed and sent to pathology as a fresh specimen.  The area was then packed to obtain hemostasis.  Electrocautery was also used for hemostasis of the edges of the pocket.  There was minimal oozing.  I placed a Surgicel into the wound and repacked it.  I checked this a couple minutes later and it appeared dry.  I decided to close the area and leave the Surgicel in place.  This was closed using interrupted 2-0 Vicryl sutures and 3-0 Vicryl sutures.  The skin was closed using a running 4-0 Vicryl suture and Dermabond was applied.  The patient was then awakened from anesthesia and sent to the postanesthesia care unit in stable condition.  All counts were correct per operating room staff.  Vanita Panda, MD  Colorectal and General Surgery [facility]

## 2023-09-22 NOTE — Transfer of Care (Signed)
Immediate Anesthesia Transfer of Care Note  Patient: Angel [name]  Procedure(s) Performed: LYMPH NODE BIOPSY  Patient Location: PACU  Anesthesia Type:MAC  Level of Consciousness: sedated  Airway & Oxygen Therapy: Patient Spontanous Breathing and Patient connected to face mask oxygen  Post-op Assessment: Report given to RN and Post -op Vital signs reviewed and stable  Post vital signs: Reviewed and stable  Last Vitals:  Vitals Value Taken Time  BP 93/67 09/22/23 1205  Temp    Pulse 90 09/22/23 1206  Resp 17 09/22/23 1206  SpO2 97 % 09/22/23 1206  Vitals shown include unfiled device data.  Last Pain:  Vitals:   09/22/23 0836  TempSrc: Oral  PainSc: 0-No pain      Patients Stated Pain Goal: 6 (09/22/23 0836)  Complications: No notable events documented.

## 2023-09-26 ENCOUNTER — Encounter (HOSPITAL_BASED_OUTPATIENT_CLINIC_OR_DEPARTMENT_OTHER): Payer: Self-pay | Admitting: General Surgery

## 2023-09-26 LAB — SURGICAL PATHOLOGY

## 2023-09-28 ENCOUNTER — Inpatient Hospital Stay (HOSPITAL_BASED_OUTPATIENT_CLINIC_OR_DEPARTMENT_OTHER): Payer: 59 | Admitting: Oncology

## 2023-09-28 ENCOUNTER — Inpatient Hospital Stay: Payer: 59

## 2023-09-28 ENCOUNTER — Encounter: Payer: Self-pay | Admitting: Oncology

## 2023-09-28 VITALS — BP 151/87 | HR 88 | Temp 98.1°F | Resp 18 | Ht 70.0 in | Wt 247.3 lb

## 2023-09-28 DIAGNOSIS — T451X5A Adverse effect of antineoplastic and immunosuppressive drugs, initial encounter: Secondary | ICD-10-CM | POA: Diagnosis not present

## 2023-09-28 DIAGNOSIS — R590 Localized enlarged lymph nodes: Secondary | ICD-10-CM | POA: Diagnosis not present

## 2023-09-28 DIAGNOSIS — D701 Agranulocytosis secondary to cancer chemotherapy: Secondary | ICD-10-CM | POA: Diagnosis not present

## 2023-09-28 DIAGNOSIS — C9110 Chronic lymphocytic leukemia of B-cell type not having achieved remission: Secondary | ICD-10-CM | POA: Diagnosis not present

## 2023-09-28 DIAGNOSIS — Z79899 Other long term (current) drug therapy: Secondary | ICD-10-CM | POA: Diagnosis not present

## 2023-09-28 DIAGNOSIS — C8300 Small cell B-cell lymphoma, unspecified site: Secondary | ICD-10-CM | POA: Insufficient documentation

## 2023-09-28 DIAGNOSIS — K760 Fatty (change of) liver, not elsewhere classified: Secondary | ICD-10-CM | POA: Diagnosis not present

## 2023-09-28 DIAGNOSIS — E119 Type 2 diabetes mellitus without complications: Secondary | ICD-10-CM | POA: Diagnosis not present

## 2023-09-28 DIAGNOSIS — C187 Malignant neoplasm of sigmoid colon: Secondary | ICD-10-CM | POA: Diagnosis not present

## 2023-09-28 DIAGNOSIS — R718 Other abnormality of red blood cells: Secondary | ICD-10-CM | POA: Diagnosis not present

## 2023-09-28 DIAGNOSIS — Z800 Family history of malignant neoplasm of digestive organs: Secondary | ICD-10-CM | POA: Diagnosis not present

## 2023-09-28 DIAGNOSIS — I10 Essential (primary) hypertension: Secondary | ICD-10-CM | POA: Diagnosis not present

## 2023-09-28 LAB — CBC WITH DIFFERENTIAL (CANCER CENTER ONLY)
Abs Immature Granulocytes: 0.01 10*3/uL (ref 0.00–0.07)
Basophils Absolute: 0 10*3/uL (ref 0.0–0.1)
Basophils Relative: 1 %
Eosinophils Absolute: 0.3 10*3/uL (ref 0.0–0.5)
Eosinophils Relative: 5 %
HCT: 47.5 % (ref 39.0–52.0)
Hemoglobin: 14.9 g/dL (ref 13.0–17.0)
Immature Granulocytes: 0 %
Lymphocytes Relative: 48 %
Lymphs Abs: 3 10*3/uL (ref 0.7–4.0)
MCH: 23.2 pg — ABNORMAL LOW (ref 26.0–34.0)
MCHC: 31.4 g/dL (ref 30.0–36.0)
MCV: 74 fL — ABNORMAL LOW (ref 80.0–100.0)
Monocytes Absolute: 0.9 10*3/uL (ref 0.1–1.0)
Monocytes Relative: 14 %
Neutro Abs: 2 10*3/uL (ref 1.7–7.7)
Neutrophils Relative %: 32 %
Platelet Count: 166 10*3/uL (ref 150–400)
RBC: 6.42 MIL/uL — ABNORMAL HIGH (ref 4.22–5.81)
RDW: 17.2 % — ABNORMAL HIGH (ref 11.5–15.5)
WBC Count: 6.2 10*3/uL (ref 4.0–10.5)
WBC Morphology: ABNORMAL
nRBC: 0 % (ref 0.0–0.2)

## 2023-09-28 LAB — CMP (CANCER CENTER ONLY)
ALT: 15 U/L (ref 0–44)
AST: 24 U/L (ref 15–41)
Albumin: 4.7 g/dL (ref 3.5–5.0)
Alkaline Phosphatase: 98 U/L (ref 38–126)
Anion gap: 8 (ref 5–15)
BUN: 19 mg/dL (ref 8–23)
CO2: 29 mmol/L (ref 22–32)
Calcium: 9.9 mg/dL (ref 8.9–10.3)
Chloride: 102 mmol/L (ref 98–111)
Creatinine: 1.09 mg/dL (ref 0.61–1.24)
GFR, Estimated: >60 mL/min (ref >60)
Glucose, Bld: 93 mg/dL (ref 70–99)
Potassium: 4 mmol/L (ref 3.5–5.1)
Sodium: 139 mmol/L (ref 135–145)
Total Bilirubin: 0.6 mg/dL (ref 0.3–1.2)
Total Protein: 7.1 g/dL (ref 6.5–8.1)

## 2023-09-28 LAB — HEPATITIS B CORE ANTIBODY, TOTAL: Hep B Core Total Ab: REACTIVE — AB

## 2023-09-28 LAB — LACTATE DEHYDROGENASE: LDH: 413 U/L — ABNORMAL HIGH (ref 98–192)

## 2023-09-28 LAB — HEPATITIS B SURFACE ANTIGEN: Hepatitis B Surface Ag: NONREACTIVE

## 2023-09-28 LAB — URIC ACID: Uric Acid, Serum: 5.8 mg/dL (ref 3.7–8.6)

## 2023-09-28 NOTE — Progress Notes (Signed)
[facility] OFFICE PROGRESS NOTE   Diagnosis: Small cell lymphoma  INTERVAL HISTORY:   Angel [name] returns as scheduled.  He underwent a axillary lymph node biopsy 09/08/2023.  The tissue was necrotic and nondiagnostic.  Left axillary lymph node biopsy 09/22/2023.  He generally feels well.  He has discomfort related to bilateral axillary lymphadenopathy.  Good appetite.  No fever.  He occasionally feels hot at night.  No dyspnea. Objective:  Vital signs in last 24 hours:  Blood pressure (!) 151/87, pulse 88, temperature 98.1 F (36.7 C), temperature source Temporal, resp. rate 18, height 5\' 10"  (1.778 m), weight 247 lb 4.8 oz (112.2 kg), SpO2 98%.    HEENT: Enlargement of the right greater than left tonsil Lymphatics: Bulky lateral cervical, supraclavicular, axillary, and inguinal nodes Resp: Lungs clear bilaterally Cardio: Regular rate and rhythm GI: No hepatosplenomegaly, no mass, nontender Vascular: No leg edema  Skin: Axillary and incisions appear to be healing   Lab Results:  Lab Results  Component Value Date   WBC 8.4 08/29/2023   HGB 17.7 (H) 09/22/2023   HCT 52.0 09/22/2023   MCV 74.6 (L) 08/29/2023   PLT 146 (L) 08/29/2023   NEUTROABS 2.2 08/29/2023    CMP  Lab Results  Component Value Date   NA 140 09/22/2023   K 4.2 09/22/2023   CL 102 09/22/2023   CO2 27 08/21/2023   GLUCOSE 115 (H) 09/22/2023   BUN 13 09/22/2023   CREATININE 0.90 09/22/2023   CALCIUM 9.6 08/21/2023   PROT 7.6 08/21/2023   ALBUMIN 4.7 08/21/2023   AST 35 08/21/2023   ALT 27 08/21/2023   ALKPHOS 109 08/21/2023   BILITOT 0.7 08/21/2023   GFRNONAA >60 08/21/2023   GFRAA >60 05/08/2020    Lab Results  Component Value Date   CEA1 1.52 10/19/2020   CEA 2.09 08/21/2023    Medications: I have reviewed the patient's current medications.   Assessment/Plan: Colon cancer, sigmoid, stage IIIa (T1,N1), status post a partial colectomy 02/02/2017 1/26 lymph nodes  positive MSI-stable, no loss of mismatch repair protein expression Staging CTs 12/29/2016-small indeterminate left lung nodules Normal preoperative CEA Cycle 1 CAPOX 03/03/2017 Cycle 2 CAPOX 03/24/2017 Cycle 3 CAPOX 04/14/2017 Cycle 4 CAPOX 05/05/2017 CTs 11/06/2018- negative for recurrent disease, small left lung nodule stable from 2007-consider benign Colonoscopy 05/29/2019-negative CTs 10/25/2019-mild increase in a 1 cm small bowel mesenteric lymph node, no evidence of recurrent disease CTs 10/20/2020-several small left-sided lung nodule stable when compared to prior CTs.  No evidence of metastatic disease in the abdomen or pelvis.  Diffuse fatty infiltration of the liver.   2.  Indeterminate left lung nodules on a chest CT 12/29/2016, stable lung nodules on CT 10/25/2019   3.   Chronic red cell microcytosis   4.   Diabetes   5.   Hypertension   6.   Family history of colon cancer   7.   Multiple polyps noted on the colonoscopy 12/19/2016   8.   Neutropenia secondary to chemotherapy  9.   Extensive peripheral adenopathy on exam 08/21/2023-CTs neck through pelvis 08/24/2023 with extensive lymphadenopathy neck, chest, abdomen and pelvis.  Scattered tiny lung nodule stable from prior examination and compatible with a benign finding. 09/08/2023-right axillary lymph node biopsy-necrotic lymph node 09/22/2023-left axillary lymph node biopsy-75% of lymphocytes with CD5/CD200 positive lambda restricted on flow cytometry, surgical otology with a CD5 positive B-cell lymphoma, CD20, PAX5, cyclin D1 negative, perforation right 5-10%, CLL/SLL favored    Disposition:  Angel [name] has been diagnosed with non-Hodgkin's lymphoma.  The clinical presentation, flow cytometry, and left axillary lymph node pathology.  We discussed treatment options.  We initially suggest Bendamustine/rituximab, but with data supporting a benefit from targeted therapy I will recommend a B TK inhibitor versus  obinutuzumab/venetoclax.  We are waiting on results from a CLL FISH panel.  I discussed options of obinutuzumab/venetoclax versus acalabrutinib with Angel [name].  He prefers fixed duration obinutuzumab/venetoclax.  He understands the potential need for hospitalization in order to receive the initial dosing of venetoclax.  We discussed the potential of an allergic reaction, atypical infections, reactivation of hepatitis, and pneumonitis associated with obinutuzumab.  He agrees to proceed.  A treatment plan was entered today.  He will begin treatment with single agent obinutuzumab.  Venetoclax will be added on a dose escalation schedule beginning with cycle 2.    Thornton Papas, MD  09/28/2023  11:18 AM

## 2023-09-28 NOTE — Progress Notes (Signed)
DISCONTINUE OFF PATHWAY REGIMEN - Lymphoma and CLL   [id number]:Bendamustine 70 mg/m2 IV D1,2 + Rituximab 375/500 mg/m2 IV D1 q28 Days x 6 Cycles:   Cycle 1: A cycle is 28 days:     Rituximab-xxxx      Bendamustine    Cycles 2 through 6: A cycle is every 28 days:     Rituximab-xxxx      Bendamustine   **Always confirm dose/schedule in your pharmacy ordering system**  REASON: Other Reason PRIOR TREATMENT: Off Pathway: Bendamustine 70 mg/m2 IV D1,2 + Rituximab 375/500 mg/m2 IV D1 q28 Days x 6 Cycles  START ON PATHWAY REGIMEN - Lymphoma and CLL     Cycle 1: A cycle is 28 days:     Venetoclax      Obinutuzumab      Obinutuzumab      Obinutuzumab    Cycle 2: A cycle is 28 days:     Venetoclax      Venetoclax      Venetoclax      Venetoclax      Obinutuzumab    Cycles 3 through 6: A cycle is 28 days:     Venetoclax      Obinutuzumab    Cycles 7 through 12: A cycle is 28 days:     Venetoclax   **Always confirm dose/schedule in your pharmacy ordering system**  Patient Characteristics: Small Lymphocytic Lymphoma (SLL), Treatment Indicated, First Line Disease Type: Small Lymphocytic Lymphoma (SLL) Disease Type: Not Applicable Disease Type: Not Applicable Treatment Indicated<= Treatment Indicated Line of Therapy: First Line Intent of Therapy: Non-Curative / Palliative Intent, Discussed with Patient

## 2023-09-28 NOTE — Progress Notes (Signed)
START OFF PATHWAY REGIMEN - Lymphoma and CLL   [id number]:Bendamustine 70 mg/m2 IV D1,2 + Rituximab 375/500 mg/m2 IV D1 q28 Days x 6 Cycles:   Cycle 1: A cycle is 28 days:     Rituximab-xxxx      Bendamustine    Cycles 2 through 6: A cycle is every 28 days:     Rituximab-xxxx      Bendamustine   **Always confirm dose/schedule in your pharmacy ordering system**  Patient Characteristics: Small Lymphocytic Lymphoma (SLL), Treatment Indicated, First Line Disease Type: Small Lymphocytic Lymphoma (SLL) Disease Type: Not Applicable Disease Type: Not Applicable Treatment Indicated<= Treatment Indicated Line of Therapy: First Line Intent of Therapy: Non-Curative / Palliative Intent, Discussed with Patient

## 2023-09-29 ENCOUNTER — Other Ambulatory Visit: Payer: Self-pay

## 2023-09-29 ENCOUNTER — Telehealth: Payer: Self-pay | Admitting: *Deleted

## 2023-09-29 MED ORDER — PROCHLORPERAZINE MALEATE 10 MG PO TABS: 10.0000 mg | ORAL_TABLET | Freq: Four times a day (QID) | ORAL | 1 refills | Status: AC | PRN

## 2023-09-29 MED ORDER — ALLOPURINOL 100 MG PO TABS: 100.0000 mg | ORAL_TABLET | Freq: Every day | ORAL | 0 refills | Status: DC

## 2023-09-29 NOTE — Telephone Encounter (Signed)
Called Angel [name] about scripts for allopurinol and compazine. Start the allopurinol on 11/4 (day before 1st tx). He understands and agrees. Will provide printed information on [facility] when he is here on 11/5. Dr. Truett Perna had called him day prior to go over this regimen and side effects.

## 2023-09-30 LAB — BETA 2 MICROGLOBULIN, SERUM: Beta-2 Microglobulin: 3.6 mg/L — ABNORMAL HIGH (ref 0.6–2.4)

## 2023-10-01 ENCOUNTER — Other Ambulatory Visit: Payer: Self-pay | Admitting: Oncology

## 2023-10-03 ENCOUNTER — Inpatient Hospital Stay: Payer: 59 | Attending: Oncology | Admitting: Nurse Practitioner

## 2023-10-03 ENCOUNTER — Inpatient Hospital Stay: Payer: 59 | Admitting: Licensed Clinical Social Worker

## 2023-10-03 ENCOUNTER — Other Ambulatory Visit: Payer: Medicare Other

## 2023-10-03 ENCOUNTER — Other Ambulatory Visit: Payer: Self-pay

## 2023-10-03 ENCOUNTER — Inpatient Hospital Stay: Payer: 59

## 2023-10-03 ENCOUNTER — Encounter: Payer: Self-pay | Admitting: Nurse Practitioner

## 2023-10-03 ENCOUNTER — Encounter (HOSPITAL_COMMUNITY): Payer: Self-pay

## 2023-10-03 VITALS — BP 130/82 | HR 95 | Temp 97.9°F | Resp 18 | Ht 70.0 in | Wt 249.0 lb

## 2023-10-03 VITALS — BP 144/83 | HR 95 | Temp 97.9°F | Resp 16

## 2023-10-03 DIAGNOSIS — T451X5A Adverse effect of antineoplastic and immunosuppressive drugs, initial encounter: Secondary | ICD-10-CM | POA: Diagnosis not present

## 2023-10-03 DIAGNOSIS — Z860100 Personal history of colon polyps, unspecified: Secondary | ICD-10-CM | POA: Diagnosis not present

## 2023-10-03 DIAGNOSIS — Z79620 Long term (current) use of immunosuppressive biologic: Secondary | ICD-10-CM | POA: Diagnosis not present

## 2023-10-03 DIAGNOSIS — Z800 Family history of malignant neoplasm of digestive organs: Secondary | ICD-10-CM | POA: Insufficient documentation

## 2023-10-03 DIAGNOSIS — Z79899 Other long term (current) drug therapy: Secondary | ICD-10-CM | POA: Diagnosis not present

## 2023-10-03 DIAGNOSIS — K760 Fatty (change of) liver, not elsewhere classified: Secondary | ICD-10-CM | POA: Diagnosis not present

## 2023-10-03 DIAGNOSIS — C8300 Small cell B-cell lymphoma, unspecified site: Secondary | ICD-10-CM | POA: Diagnosis not present

## 2023-10-03 DIAGNOSIS — R590 Localized enlarged lymph nodes: Secondary | ICD-10-CM | POA: Diagnosis not present

## 2023-10-03 DIAGNOSIS — D701 Agranulocytosis secondary to cancer chemotherapy: Secondary | ICD-10-CM | POA: Diagnosis not present

## 2023-10-03 DIAGNOSIS — R718 Other abnormality of red blood cells: Secondary | ICD-10-CM | POA: Insufficient documentation

## 2023-10-03 DIAGNOSIS — E119 Type 2 diabetes mellitus without complications: Secondary | ICD-10-CM | POA: Diagnosis not present

## 2023-10-03 DIAGNOSIS — I10 Essential (primary) hypertension: Secondary | ICD-10-CM | POA: Diagnosis not present

## 2023-10-03 DIAGNOSIS — Z5112 Encounter for antineoplastic immunotherapy: Secondary | ICD-10-CM | POA: Insufficient documentation

## 2023-10-03 DIAGNOSIS — R911 Solitary pulmonary nodule: Secondary | ICD-10-CM | POA: Diagnosis not present

## 2023-10-03 DIAGNOSIS — C8514 Unspecified B-cell lymphoma, lymph nodes of axilla and upper limb: Secondary | ICD-10-CM | POA: Insufficient documentation

## 2023-10-03 MED ORDER — SODIUM CHLORIDE 0.9 % IV SOLN
20.0000 mg | Freq: Once | INTRAVENOUS | Status: AC
  Administered 2023-10-03: 20 mg via INTRAVENOUS
  Filled 2023-10-03: qty 20

## 2023-10-03 MED ORDER — SODIUM CHLORIDE 0.9 % IV SOLN: INTRAVENOUS | Status: DC

## 2023-10-03 MED ORDER — DIPHENHYDRAMINE HCL 50 MG/ML IJ SOLN
50.0000 mg | Freq: Once | INTRAMUSCULAR | Status: AC
  Administered 2023-10-03: 50 mg via INTRAVENOUS
  Filled 2023-10-03: qty 1

## 2023-10-03 MED ORDER — SODIUM CHLORIDE 0.9 % IV SOLN
100.0000 mg | Freq: Once | INTRAVENOUS | Status: AC
  Administered 2023-10-03: 100 mg via INTRAVENOUS
  Filled 2023-10-03: qty 4

## 2023-10-03 MED ORDER — ACETAMINOPHEN 325 MG PO TABS
650.0000 mg | ORAL_TABLET | Freq: Once | ORAL | Status: AC
Start: 2023-10-03 — End: 2023-10-03
  Administered 2023-10-03: 650 mg via ORAL
  Filled 2023-10-03: qty 2

## 2023-10-03 NOTE — Progress Notes (Signed)
[facility] Clinical Social Work  Initial Assessment   Angel [name] is a 72 y.o. year old male accompanied by spouse. Clinical Social Work met with patient in infusion for assessment of psychosocial needs.   SDOH (Social Determinants of Health) assessments performed: Yes SDOH Interventions    Flowsheet Row Clinical Support from 10/03/2023 in [facility] - A Dept Of [place]  SDOH Interventions   Food Insecurity Interventions Intervention Not Indicated  Housing Interventions Intervention Not Indicated  Transportation Interventions Intervention Not Indicated  Utilities Interventions Intervention Not Indicated  Financial Strain Interventions Intervention Not Indicated  Health Literacy Interventions Intervention Not Indicated       SDOH Screenings   Food Insecurity: No Food Insecurity (10/03/2023)  Housing: Low Risk  (10/03/2023)  Transportation Needs: No Transportation Needs (10/03/2023)  Utilities: Not At Risk (10/03/2023)  Financial Resource Strain: Low Risk  (10/03/2023)  Tobacco Use: Medium Risk (10/03/2023)  Health Literacy: Adequate Health Literacy (10/03/2023)     Distress Screen completed: No     No data to display            Family/Social Information:  Housing Arrangement: patient lives with his wife, Angel [name]. Family members/support persons in your life? Family Transportation concerns: no  Employment: Retired  Income source: Actor concerns: No Type of concern: None Food access concerns: no Religious or spiritual practice: Yes-Patient identifies as Non-Denominational. Services Currently in place:  [facility] Medicare  Coping/ Adjustment to diagnosis: Patient understands treatment plan and what happens next? yes Concerns about diagnosis and/or treatment:  Angel [name] did not express any. Patient reported stressors:  None identified by wife. Hopes and/or priorities: Family Patient enjoys time with family/  friends Current coping skills/ strengths: Capable of independent living , Contractor , General fund of knowledge , Motivation for treatment/growth , and Supportive family/friends     SUMMARY: Current SDOH Barriers:  None identified by spouse.  Clinical Social Work Clinical Goal(s):  No clinical social work goals at this time  Interventions: Discussed common feeling and emotions when being diagnosed with cancer, and the importance of support during treatment Informed patient of the support team roles and support services at [facility] Provided CSW contact information and encouraged patient to call with any questions or concerns Provided patient with information about social work role and contact information.  Patient was sleeping during the visit and Angel [name] answered the questions.  She expressed no needs at this time.   Follow Up Plan: Patient will contact CSW with any support or resource needs Patient verbalizes understanding of plan: Yes    Dorothey Baseman, LCSW Clinical Social Worker [facility]

## 2023-10-03 NOTE — Progress Notes (Signed)
Patient seen by Lonna Cobb NP today  Vitals are within treatment parameters:Yes   Labs are within treatment parameters: Yes   Treatment plan has been signed: Yes   Per physician team, Patient is ready for treatment and there are NO modifications to the treatment plan.

## 2023-10-03 NOTE — Progress Notes (Signed)
[facility] OFFICE PROGRESS NOTE   Diagnosis: Small cell lymphoma  INTERVAL HISTORY:   Angel [name] returns as scheduled.  No fevers or sweats.  He has a good appetite.  No nausea or vomiting.  Bowels are moving.  Some discomfort at the axillary surgical incision sites.  He began allopurinol yesterday.  Objective:  Vital signs in last 24 hours:  Blood pressure 130/82, pulse 95, temperature 97.9 F (36.6 C), temperature source Temporal, resp. rate 18, height 5\' 10"  (1.778 m), weight 249 lb (112.9 kg), SpO2 98%.    Lymphatics: Bulky bilateral cervical, supraclavicular, axillary and inguinal lymph nodes.  Small right femoral node. Resp: Lungs clear bilaterally. Cardio: Regular rate and rhythm. GI: No hepatosplenomegaly. Vascular: No leg edema. Skin: Right axillary incision appears healed.  Left axillary incision with a few very superficially open areas.   Lab Results:  Lab Results  Component Value Date   WBC 6.2 09/28/2023   HGB 14.9 09/28/2023   HCT 47.5 09/28/2023   MCV 74.0 (L) 09/28/2023   PLT 166 09/28/2023   NEUTROABS 2.0 09/28/2023    Imaging:  No results found.  Medications: I have reviewed the patient's current medications.  Assessment/Plan: Colon cancer, sigmoid, stage IIIa (T1,N1), status post a partial colectomy 02/02/2017 1/26 lymph nodes positive MSI-stable, no loss of mismatch repair protein expression Staging CTs 12/29/2016-small indeterminate left lung nodules Normal preoperative CEA Cycle 1 CAPOX 03/03/2017 Cycle 2 CAPOX 03/24/2017 Cycle 3 CAPOX 04/14/2017 Cycle 4 CAPOX 05/05/2017 CTs 11/06/2018- negative for recurrent disease, small left lung nodule stable from 2007-consider benign Colonoscopy 05/29/2019-negative CTs 10/25/2019-mild increase in a 1 cm small bowel mesenteric lymph node, no evidence of recurrent disease CTs 10/20/2020-several small left-sided lung nodule stable when compared to prior CTs.  No evidence of metastatic  disease in the abdomen or pelvis.  Diffuse fatty infiltration of the liver.    2.  Indeterminate left lung nodules on a chest CT 12/29/2016, stable lung nodules on CT 10/25/2019   3.   Chronic red cell microcytosis   4.   Diabetes   5.   Hypertension   6.   Family history of colon cancer   7.   Multiple polyps noted on the colonoscopy 12/19/2016   8.   Neutropenia secondary to chemotherapy   9.   Extensive peripheral adenopathy on exam 08/21/2023-CTs neck through pelvis 08/24/2023 with extensive lymphadenopathy neck, chest, abdomen and pelvis.  Scattered tiny lung nodule stable from prior examination and compatible with a benign finding. 09/08/2023-right axillary lymph node biopsy-necrotic lymph node 09/22/2023-left axillary lymph node biopsy-75% of lymphocytes with CD5/CD200 positive lambda restricted on flow cytometry, surgical otology with a CD5 positive B-cell lymphoma, CD20, PAX5, cyclin D1 negative, perforation right 5-10%, CLL/SLL favored Cycle 1 day 1 obinutuzumab 10/03/2023, day 2 10/04/2023  10.  Positive hepatitis B core antibody and negative surface antigen 09/28/2023-repeat labs in 4 weeks.    Disposition: Angel [name] appears unchanged.  He has persistent bulky adenopathy.  He is scheduled to begin treatment today with obinutuzumab.  We again reviewed potential toxicities.  He understands the chance of an allergic/infusion reaction, hematologic toxicity, tumor lysis syndrome, reactivation of hepatitis B.  He agrees to proceed.  Plan to proceed with day 1 obinutuzumab today as scheduled.  Labs from 09/28/2023 reviewed, adequate to proceed with treatment.  He understands the positive hepatitis B core antibody test and the potential for reactivation of hepatitis B.  Hepatitis B surface antigen will be repeated in approximately 4  weeks.  We discussed tumor lysis syndrome in detail.  He understands to remain hydrated and to continue allopurinol as prescribed.  He will return for  day 2 obinutuzumab as scheduled.  We will see him in follow-up prior to the day 8 treatment.  He will contact the office in the interim with any problems.  Patient seen with Dr. Truett Perna.    Lonna Cobb ANP/GNP-BC   10/03/2023  8:32 AM  This was a shared visit with Lonna Cobb.  We discussed the plan for obinutuzumab/venetoclax therapy with Angel [name].  We reviewed potential toxicities associated with this regimen.  He agrees to proceed.  He understands the potential for an allergic reaction and tumor lysis syndrome.  Venetoclax will be introduced after approximately 3-4 weeks of obinutuzumab treatment. The hepatitis B core antibody returned positive indicating previous hepatitis B exposure.  We discussed the potential for reactivation of hepatitis.  He will be monitored for hepatitis B.  He will begin antibiotic prophylaxis within the next few weeks.  I was present for greater than 50% of today's visit.  I performed medical decision making.  Mancel Bale, MD

## 2023-10-03 NOTE — Patient Instructions (Signed)
[facility] - A DEPT OF [facility]  Discharge Instructions: Thank you for choosing [facility] to provide your oncology and hematology care.   If you have a lab appointment with the Cancer Center, please go directly to the Cancer Center and check in at the registration area.   Wear comfortable clothing and clothing appropriate for easy access to any Portacath or PICC line.   We strive to give you quality time with your provider. You may need to reschedule your appointment if you arrive late (15 or more minutes).  Arriving late affects you and other patients whose appointments are after yours.  Also, if you miss three or more appointments without notifying the office, you may be dismissed from the clinic at the provider's discretion.      For prescription refill requests, have your pharmacy contact our office and allow 72 hours for refills to be completed.    Today you received the following chemotherapy and/or immunotherapy agents: obinutuzumab      To help prevent nausea and vomiting after your treatment, we encourage you to take your nausea medication as directed.  BELOW ARE SYMPTOMS THAT SHOULD BE REPORTED IMMEDIATELY: *FEVER GREATER THAN 100.4 F (38 C) OR HIGHER *CHILLS OR SWEATING *NAUSEA AND VOMITING THAT IS NOT CONTROLLED WITH YOUR NAUSEA MEDICATION *UNUSUAL SHORTNESS OF BREATH *UNUSUAL BRUISING OR BLEEDING *URINARY PROBLEMS (pain or burning when urinating, or frequent urination) *BOWEL PROBLEMS (unusual diarrhea, constipation, pain near the anus) TENDERNESS IN MOUTH AND THROAT WITH OR WITHOUT PRESENCE OF ULCERS (sore throat, sores in mouth, or a toothache) UNUSUAL RASH, SWELLING OR PAIN  UNUSUAL VAGINAL DISCHARGE OR ITCHING   Items with * indicate a potential emergency and should be followed up as soon as possible or go to the Emergency Department if any problems should occur.  Please show the CHEMOTHERAPY ALERT CARD or  IMMUNOTHERAPY ALERT CARD at check-in to the Emergency Department and triage nurse.  Should you have questions after your visit or need to cancel or reschedule your appointment, please contact [facility] - A DEPT OF [facility]  Dept: [phone]  and follow the prompts.  Office hours are 8:00 a.m. to 4:30 p.m. Monday - Friday. Please note that voicemails left after 4:00 p.m. may not be returned until the following business day.  We are closed weekends and major holidays. You have access to a nurse at all times for urgent questions. Please call the main number to the clinic Dept: [phone] and follow the prompts.   For any non-urgent questions, you may also contact your provider using MyChart. We now offer e-Visits for anyone 51 and older to request care online for non-urgent symptoms. For details visit [email or web address].   Also download the MyChart app! Go to the app store, search "MyChart", open the app, select [place], and log in with your MyChart username and password.

## 2023-10-04 ENCOUNTER — Inpatient Hospital Stay: Payer: 59

## 2023-10-04 VITALS — BP 144/88 | HR 90 | Temp 97.9°F | Resp 16

## 2023-10-04 DIAGNOSIS — Z860100 Personal history of colon polyps, unspecified: Secondary | ICD-10-CM | POA: Diagnosis not present

## 2023-10-04 DIAGNOSIS — R590 Localized enlarged lymph nodes: Secondary | ICD-10-CM | POA: Diagnosis not present

## 2023-10-04 DIAGNOSIS — E119 Type 2 diabetes mellitus without complications: Secondary | ICD-10-CM | POA: Diagnosis not present

## 2023-10-04 DIAGNOSIS — R718 Other abnormality of red blood cells: Secondary | ICD-10-CM | POA: Diagnosis not present

## 2023-10-04 DIAGNOSIS — T451X5A Adverse effect of antineoplastic and immunosuppressive drugs, initial encounter: Secondary | ICD-10-CM | POA: Diagnosis not present

## 2023-10-04 DIAGNOSIS — Z5112 Encounter for antineoplastic immunotherapy: Secondary | ICD-10-CM | POA: Diagnosis not present

## 2023-10-04 DIAGNOSIS — Z79899 Other long term (current) drug therapy: Secondary | ICD-10-CM | POA: Diagnosis not present

## 2023-10-04 DIAGNOSIS — Z79620 Long term (current) use of immunosuppressive biologic: Secondary | ICD-10-CM | POA: Diagnosis not present

## 2023-10-04 DIAGNOSIS — R911 Solitary pulmonary nodule: Secondary | ICD-10-CM | POA: Diagnosis not present

## 2023-10-04 DIAGNOSIS — Z800 Family history of malignant neoplasm of digestive organs: Secondary | ICD-10-CM | POA: Diagnosis not present

## 2023-10-04 DIAGNOSIS — K760 Fatty (change of) liver, not elsewhere classified: Secondary | ICD-10-CM | POA: Diagnosis not present

## 2023-10-04 DIAGNOSIS — C8300 Small cell B-cell lymphoma, unspecified site: Secondary | ICD-10-CM

## 2023-10-04 DIAGNOSIS — D701 Agranulocytosis secondary to cancer chemotherapy: Secondary | ICD-10-CM | POA: Diagnosis not present

## 2023-10-04 DIAGNOSIS — C8514 Unspecified B-cell lymphoma, lymph nodes of axilla and upper limb: Secondary | ICD-10-CM | POA: Diagnosis not present

## 2023-10-04 DIAGNOSIS — I10 Essential (primary) hypertension: Secondary | ICD-10-CM | POA: Diagnosis not present

## 2023-10-04 MED ORDER — SODIUM CHLORIDE 0.9 % IV SOLN
20.0000 mg | Freq: Once | INTRAVENOUS | Status: AC
  Administered 2023-10-04: 20 mg via INTRAVENOUS
  Filled 2023-10-04: qty 20

## 2023-10-04 MED ORDER — SODIUM CHLORIDE 0.9 % IV SOLN
900.0000 mg | Freq: Once | INTRAVENOUS | Status: AC
  Administered 2023-10-04: 900 mg via INTRAVENOUS
  Filled 2023-10-04: qty 36

## 2023-10-04 MED ORDER — ACETAMINOPHEN 325 MG PO TABS
650.0000 mg | ORAL_TABLET | Freq: Once | ORAL | Status: AC
Start: 2023-10-04 — End: 2023-10-04
  Administered 2023-10-04: 650 mg via ORAL
  Filled 2023-10-04: qty 2

## 2023-10-04 MED ORDER — SODIUM CHLORIDE 0.9 % IV SOLN: INTRAVENOUS | Status: DC

## 2023-10-04 MED ORDER — DIPHENHYDRAMINE HCL 50 MG/ML IJ SOLN
50.0000 mg | Freq: Once | INTRAMUSCULAR | Status: AC
  Administered 2023-10-04: 50 mg via INTRAVENOUS
  Filled 2023-10-04: qty 1

## 2023-10-05 ENCOUNTER — Other Ambulatory Visit: Payer: Self-pay

## 2023-10-05 ENCOUNTER — Encounter (HOSPITAL_COMMUNITY): Payer: Self-pay

## 2023-10-08 ENCOUNTER — Other Ambulatory Visit: Payer: Self-pay | Admitting: Oncology

## 2023-10-09 ENCOUNTER — Inpatient Hospital Stay: Payer: 59

## 2023-10-09 ENCOUNTER — Other Ambulatory Visit: Payer: Self-pay | Admitting: *Deleted

## 2023-10-09 ENCOUNTER — Encounter: Payer: Self-pay | Admitting: *Deleted

## 2023-10-09 ENCOUNTER — Inpatient Hospital Stay (HOSPITAL_BASED_OUTPATIENT_CLINIC_OR_DEPARTMENT_OTHER): Payer: 59 | Admitting: Oncology

## 2023-10-09 VITALS — BP 134/78 | HR 84 | Temp 97.9°F | Wt 249.8 lb

## 2023-10-09 VITALS — BP 141/88 | HR 89 | Temp 98.1°F | Resp 18

## 2023-10-09 DIAGNOSIS — R911 Solitary pulmonary nodule: Secondary | ICD-10-CM | POA: Diagnosis not present

## 2023-10-09 DIAGNOSIS — R718 Other abnormality of red blood cells: Secondary | ICD-10-CM | POA: Diagnosis not present

## 2023-10-09 DIAGNOSIS — C8300 Small cell B-cell lymphoma, unspecified site: Secondary | ICD-10-CM

## 2023-10-09 DIAGNOSIS — Z860100 Personal history of colon polyps, unspecified: Secondary | ICD-10-CM | POA: Diagnosis not present

## 2023-10-09 DIAGNOSIS — C8514 Unspecified B-cell lymphoma, lymph nodes of axilla and upper limb: Secondary | ICD-10-CM | POA: Diagnosis not present

## 2023-10-09 DIAGNOSIS — I10 Essential (primary) hypertension: Secondary | ICD-10-CM | POA: Diagnosis not present

## 2023-10-09 DIAGNOSIS — Z800 Family history of malignant neoplasm of digestive organs: Secondary | ICD-10-CM | POA: Diagnosis not present

## 2023-10-09 DIAGNOSIS — T451X5A Adverse effect of antineoplastic and immunosuppressive drugs, initial encounter: Secondary | ICD-10-CM | POA: Diagnosis not present

## 2023-10-09 DIAGNOSIS — Z5112 Encounter for antineoplastic immunotherapy: Secondary | ICD-10-CM | POA: Diagnosis not present

## 2023-10-09 DIAGNOSIS — Z79899 Other long term (current) drug therapy: Secondary | ICD-10-CM | POA: Diagnosis not present

## 2023-10-09 DIAGNOSIS — R590 Localized enlarged lymph nodes: Secondary | ICD-10-CM | POA: Diagnosis not present

## 2023-10-09 DIAGNOSIS — K760 Fatty (change of) liver, not elsewhere classified: Secondary | ICD-10-CM | POA: Diagnosis not present

## 2023-10-09 DIAGNOSIS — D701 Agranulocytosis secondary to cancer chemotherapy: Secondary | ICD-10-CM | POA: Diagnosis not present

## 2023-10-09 DIAGNOSIS — E119 Type 2 diabetes mellitus without complications: Secondary | ICD-10-CM | POA: Diagnosis not present

## 2023-10-09 DIAGNOSIS — Z79620 Long term (current) use of immunosuppressive biologic: Secondary | ICD-10-CM | POA: Diagnosis not present

## 2023-10-09 LAB — CBC WITH DIFFERENTIAL (CANCER CENTER ONLY)
Abs Immature Granulocytes: 0.01 10*3/uL (ref 0.00–0.07)
Basophils Absolute: 0 10*3/uL (ref 0.0–0.1)
Basophils Relative: 1 %
Eosinophils Absolute: 0.3 10*3/uL (ref 0.0–0.5)
Eosinophils Relative: 10 %
HCT: 45.4 % (ref 39.0–52.0)
Hemoglobin: 14.5 g/dL (ref 13.0–17.0)
Immature Granulocytes: 0 %
Lymphocytes Relative: 16 %
Lymphs Abs: 0.5 10*3/uL — ABNORMAL LOW (ref 0.7–4.0)
MCH: 23.1 pg — ABNORMAL LOW (ref 26.0–34.0)
MCHC: 31.9 g/dL (ref 30.0–36.0)
MCV: 72.4 fL — ABNORMAL LOW (ref 80.0–100.0)
Monocytes Absolute: 0.5 10*3/uL (ref 0.1–1.0)
Monocytes Relative: 15 %
Neutro Abs: 1.9 10*3/uL (ref 1.7–7.7)
Neutrophils Relative %: 58 %
Platelet Count: 159 10*3/uL (ref 150–400)
RBC: 6.27 MIL/uL — ABNORMAL HIGH (ref 4.22–5.81)
RDW: 16.1 % — ABNORMAL HIGH (ref 11.5–15.5)
WBC Count: 3.2 10*3/uL — ABNORMAL LOW (ref 4.0–10.5)
nRBC: 0 % (ref 0.0–0.2)

## 2023-10-09 LAB — CMP (CANCER CENTER ONLY)
ALT: 29 U/L (ref 0–44)
AST: 44 U/L — ABNORMAL HIGH (ref 15–41)
Albumin: 4.1 g/dL (ref 3.5–5.0)
Alkaline Phosphatase: 85 U/L (ref 38–126)
Anion gap: 9 (ref 5–15)
BUN: 16 mg/dL (ref 8–23)
CO2: 25 mmol/L (ref 22–32)
Calcium: 9.4 mg/dL (ref 8.9–10.3)
Chloride: 101 mmol/L (ref 98–111)
Creatinine: 1.04 mg/dL (ref 0.61–1.24)
GFR, Estimated: >60 mL/min (ref >60)
Glucose, Bld: 157 mg/dL — ABNORMAL HIGH (ref 70–99)
Potassium: 4.3 mmol/L (ref 3.5–5.1)
Sodium: 135 mmol/L (ref 135–145)
Total Bilirubin: 0.5 mg/dL (ref <1.2)
Total Protein: 7 g/dL (ref 6.5–8.1)

## 2023-10-09 LAB — LACTATE DEHYDROGENASE: LDH: 476 U/L — ABNORMAL HIGH (ref 98–192)

## 2023-10-09 LAB — PHOSPHORUS: Phosphorus: 3.1 mg/dL (ref 2.5–4.6)

## 2023-10-09 LAB — URIC ACID: Uric Acid, Serum: 3.6 mg/dL — ABNORMAL LOW (ref 3.7–8.6)

## 2023-10-09 MED ORDER — SODIUM CHLORIDE 0.9 % IV SOLN: INTRAVENOUS | Status: DC

## 2023-10-09 MED ORDER — VALACYCLOVIR HCL 1 G PO TABS: 1000.0000 mg | ORAL_TABLET | Freq: Two times a day (BID) | ORAL | 0 refills | Status: DC

## 2023-10-09 MED ORDER — OBINUTUZUMAB CHEMO INJECTION 1000 MG/40ML
1000.0000 mg | Freq: Once | INTRAVENOUS | Status: AC
  Administered 2023-10-09: 1000 mg via INTRAVENOUS
  Filled 2023-10-09: qty 40

## 2023-10-09 MED ORDER — DIPHENHYDRAMINE HCL 50 MG/ML IJ SOLN
50.0000 mg | Freq: Once | INTRAMUSCULAR | Status: AC
  Administered 2023-10-09: 50 mg via INTRAVENOUS
  Filled 2023-10-09: qty 1

## 2023-10-09 MED ORDER — ACETAMINOPHEN 325 MG PO TABS
650.0000 mg | ORAL_TABLET | Freq: Once | ORAL | Status: AC
  Administered 2023-10-09: 650 mg via ORAL
  Filled 2023-10-09: qty 2

## 2023-10-09 MED ORDER — VALACYCLOVIR HCL 500 MG PO TABS: 500.0000 mg | ORAL_TABLET | Freq: Every day | ORAL | 3 refills | Status: DC

## 2023-10-09 MED ORDER — SODIUM CHLORIDE 0.9 % IV SOLN
20.0000 mg | Freq: Once | INTRAVENOUS | Status: AC
  Administered 2023-10-09: 20 mg via INTRAVENOUS
  Filled 2023-10-09: qty 20

## 2023-10-09 NOTE — Patient Instructions (Signed)
 [facility] - A DEPT OF [facility]  Discharge Instructions: Thank you for choosing [facility] to provide your oncology and hematology care.   If you have a lab appointment with the Cancer Center, please go directly to the Cancer Center and check in at the registration area.   Wear comfortable clothing and clothing appropriate for easy access to any Portacath or PICC line.   We strive to give you quality time with your provider. You may need to reschedule your appointment if you arrive late (15 or more minutes).  Arriving late affects you and other patients whose appointments are after yours.  Also, if you miss three or more appointments without notifying the office, you may be dismissed from the clinic at the provider's discretion.      For prescription refill requests, have your pharmacy contact our office and allow 72 hours for refills to be completed.    Today you received the following chemotherapy and/or immunotherapy agents: obinutuzumab      To help prevent nausea and vomiting after your treatment, we encourage you to take your nausea medication as directed.  BELOW ARE SYMPTOMS THAT SHOULD BE REPORTED IMMEDIATELY: *FEVER GREATER THAN 100.4 F (38 C) OR HIGHER *CHILLS OR SWEATING *NAUSEA AND VOMITING THAT IS NOT CONTROLLED WITH YOUR NAUSEA MEDICATION *UNUSUAL SHORTNESS OF BREATH *UNUSUAL BRUISING OR BLEEDING *URINARY PROBLEMS (pain or burning when urinating, or frequent urination) *BOWEL PROBLEMS (unusual diarrhea, constipation, pain near the anus) TENDERNESS IN MOUTH AND THROAT WITH OR WITHOUT PRESENCE OF ULCERS (sore throat, sores in mouth, or a toothache) UNUSUAL RASH, SWELLING OR PAIN  UNUSUAL VAGINAL DISCHARGE OR ITCHING   Items with * indicate a potential emergency and should be followed up as soon as possible or go to the Emergency Department if any problems should occur.  Please show the CHEMOTHERAPY ALERT CARD or  IMMUNOTHERAPY ALERT CARD at check-in to the Emergency Department and triage nurse.  Should you have questions after your visit or need to cancel or reschedule your appointment, please contact [facility] - A DEPT OF [facility]  Dept: [phone]  and follow the prompts.  Office hours are 8:00 a.m. to 4:30 p.m. Monday - Friday. Please note that voicemails left after 4:00 p.m. may not be returned until the following business day.  We are closed weekends and major holidays. You have access to a nurse at all times for urgent questions. Please call the main number to the clinic Dept: [phone] and follow the prompts.   For any non-urgent questions, you may also contact your provider using MyChart. We now offer e-Visits for anyone 51 and older to request care online for non-urgent symptoms. For details visit [email or web address].   Also download the MyChart app! Go to the app store, search "MyChart", open the app, select [place], and log in with your MyChart username and password.

## 2023-10-09 NOTE — Progress Notes (Signed)
Patient seen by Dr. Thornton Papas today  Vitals are within treatment parameters:Yes   Labs are within treatment parameters: Yes   Treatment plan has been signed: Yes   Per physician team, Patient is ready for treatment and there are NO modifications to the treatment plan.

## 2023-10-09 NOTE — Progress Notes (Signed)
[facility] OFFICE PROGRESS NOTE   Diagnosis: Small lymphocytic lymphoma  INTERVAL HISTORY:   Angel [name] returns as scheduled.  He began treatment with obinutuzumab on 10/03/2023.  He reports tolerating treatment well.  No symptom of allergic reaction.  No rash.  He feels the palpable lymph nodes are smaller.  He has developed a cold sore at the right greater than left lip.   Objective:  Vital signs in last 24 hours:  Blood pressure 134/78, pulse 84, temperature 97.9 F (36.6 C), weight 249 lb 12.8 oz (113.3 kg), SpO2 98%.    HEENT: No thrush, vesicular lesion at the lateral right upper lip, early lesion?  At the lateral upper left lip Lymphatics: Cervical, axillary, and inguinal nodes appear smaller, the dominant nodes in the axillary chains appear smaller Resp: End inspiratory rhonchi at the right greater than left posterior base, no respiratory distress Cardio: Regular rate and rhythm GI: No hepatosplenomegaly Vascular: No leg edema     Lab Results:  Lab Results  Component Value Date   WBC 3.2 (L) 10/09/2023   HGB 14.5 10/09/2023   HCT 45.4 10/09/2023   MCV 72.4 (L) 10/09/2023   PLT 159 10/09/2023   NEUTROABS 1.9 10/09/2023    CMP  Lab Results  Component Value Date   NA 139 09/28/2023   K 4.0 09/28/2023   CL 102 09/28/2023   CO2 29 09/28/2023   GLUCOSE 93 09/28/2023   BUN 19 09/28/2023   CREATININE 1.09 09/28/2023   CALCIUM 9.9 09/28/2023   PROT 7.1 09/28/2023   ALBUMIN 4.7 09/28/2023   AST 24 09/28/2023   ALT 15 09/28/2023   ALKPHOS 98 09/28/2023   BILITOT 0.6 09/28/2023   GFRNONAA >60 09/28/2023   GFRAA >60 05/08/2020    Lab Results  Component Value Date   CEA1 1.52 10/19/2020   CEA 2.09 08/21/2023    No results found for: "INR", "LABPROT"  Imaging:  No results found.  Medications: I have reviewed the patient's current medications.   Assessment/Plan: Colon cancer, sigmoid, stage IIIa (T1,N1), status post a partial  colectomy 02/02/2017 1/26 lymph nodes positive MSI-stable, no loss of mismatch repair protein expression Staging CTs 12/29/2016-small indeterminate left lung nodules Normal preoperative CEA Cycle 1 CAPOX 03/03/2017 Cycle 2 CAPOX 03/24/2017 Cycle 3 CAPOX 04/14/2017 Cycle 4 CAPOX 05/05/2017 CTs 11/06/2018- negative for recurrent disease, small left lung nodule stable from 2007-consider benign Colonoscopy 05/29/2019-negative CTs 10/25/2019-mild increase in a 1 cm small bowel mesenteric lymph node, no evidence of recurrent disease CTs 10/20/2020-several small left-sided lung nodule stable when compared to prior CTs.  No evidence of metastatic disease in the abdomen or pelvis.  Diffuse fatty infiltration of the liver.    2.  Indeterminate left lung nodules on a chest CT 12/29/2016, stable lung nodules on CT 10/25/2019   3.   Chronic red cell microcytosis   4.   Diabetes   5.   Hypertension   6.   Family history of colon cancer   7.   Multiple polyps noted on the colonoscopy 12/19/2016   8.   Neutropenia secondary to chemotherapy   9.   Extensive peripheral adenopathy on exam 08/21/2023-CTs neck through pelvis 08/24/2023 with extensive lymphadenopathy neck, chest, abdomen and pelvis.  Scattered tiny lung nodule stable from prior examination and compatible with a benign finding. 09/08/2023-right axillary lymph node biopsy-necrotic lymph node 09/22/2023-left axillary lymph node biopsy-75% of lymphocytes with CD5/CD200 positive lambda restricted on flow cytometry, surgical otology with a CD5 positive  B-cell lymphoma, CD20, PAX5, cyclin D1 negative, perforation right 5-10%, CLL/SLL favored Cycle 1 day 1 obinutuzumab 10/03/2023, day 2 10/04/2023  10.  Positive hepatitis B core antibody and negative surface antigen 09/28/2023-repeat labs in 4 weeks. 11.  Herpetic lip lesions 10/09/2023-Valtrex      Disposition: Mr. Volz completed day 1 and day 2 obinutuzumab.  He tolerated the treatment  well.  He will begin day 8 treatment today.  The palpable lymph nodes appear smaller.  He appears to have early herpetic lesions at the lips.  He will begin Valtrex.  He will continue Valtrex prophylaxis after completing an initial course of therapy.  Mr. Schellin will return for an office and lab visit in 1 week.  The plan is to begin venetoclax at day 22.  Thornton Papas, MD  10/09/2023  8:31 AM

## 2023-10-11 ENCOUNTER — Ambulatory Visit: Payer: Medicare Other | Admitting: Nurse Practitioner

## 2023-10-11 ENCOUNTER — Other Ambulatory Visit: Payer: Medicare Other

## 2023-10-12 ENCOUNTER — Other Ambulatory Visit: Payer: Self-pay

## 2023-10-12 DIAGNOSIS — C8300 Small cell B-cell lymphoma, unspecified site: Secondary | ICD-10-CM | POA: Diagnosis not present

## 2023-10-12 DIAGNOSIS — I10 Essential (primary) hypertension: Secondary | ICD-10-CM | POA: Diagnosis not present

## 2023-10-12 DIAGNOSIS — I700 Atherosclerosis of aorta: Secondary | ICD-10-CM | POA: Diagnosis not present

## 2023-10-12 DIAGNOSIS — C189 Malignant neoplasm of colon, unspecified: Secondary | ICD-10-CM | POA: Diagnosis not present

## 2023-10-12 DIAGNOSIS — Z0000 Encounter for general adult medical examination without abnormal findings: Secondary | ICD-10-CM | POA: Diagnosis not present

## 2023-10-12 DIAGNOSIS — E1169 Type 2 diabetes mellitus with other specified complication: Secondary | ICD-10-CM | POA: Diagnosis not present

## 2023-10-12 DIAGNOSIS — Q632 Ectopic kidney: Secondary | ICD-10-CM | POA: Diagnosis not present

## 2023-10-12 DIAGNOSIS — Z794 Long term (current) use of insulin: Secondary | ICD-10-CM | POA: Diagnosis not present

## 2023-10-12 DIAGNOSIS — E039 Hypothyroidism, unspecified: Secondary | ICD-10-CM | POA: Diagnosis not present

## 2023-10-16 ENCOUNTER — Inpatient Hospital Stay (HOSPITAL_BASED_OUTPATIENT_CLINIC_OR_DEPARTMENT_OTHER): Payer: 59 | Admitting: Nurse Practitioner

## 2023-10-16 ENCOUNTER — Encounter: Payer: Self-pay | Admitting: Nurse Practitioner

## 2023-10-16 ENCOUNTER — Inpatient Hospital Stay: Payer: 59

## 2023-10-16 VITALS — BP 129/68 | HR 71 | Temp 97.8°F | Resp 18 | Ht 70.0 in | Wt 244.7 lb

## 2023-10-16 VITALS — BP 138/74 | HR 93 | Temp 98.1°F | Resp 18

## 2023-10-16 DIAGNOSIS — I10 Essential (primary) hypertension: Secondary | ICD-10-CM | POA: Diagnosis not present

## 2023-10-16 DIAGNOSIS — Z79899 Other long term (current) drug therapy: Secondary | ICD-10-CM | POA: Diagnosis not present

## 2023-10-16 DIAGNOSIS — C8300 Small cell B-cell lymphoma, unspecified site: Secondary | ICD-10-CM

## 2023-10-16 DIAGNOSIS — K760 Fatty (change of) liver, not elsewhere classified: Secondary | ICD-10-CM | POA: Diagnosis not present

## 2023-10-16 DIAGNOSIS — C8514 Unspecified B-cell lymphoma, lymph nodes of axilla and upper limb: Secondary | ICD-10-CM | POA: Diagnosis not present

## 2023-10-16 DIAGNOSIS — R718 Other abnormality of red blood cells: Secondary | ICD-10-CM | POA: Diagnosis not present

## 2023-10-16 DIAGNOSIS — T451X5A Adverse effect of antineoplastic and immunosuppressive drugs, initial encounter: Secondary | ICD-10-CM | POA: Diagnosis not present

## 2023-10-16 DIAGNOSIS — Z800 Family history of malignant neoplasm of digestive organs: Secondary | ICD-10-CM | POA: Diagnosis not present

## 2023-10-16 DIAGNOSIS — R911 Solitary pulmonary nodule: Secondary | ICD-10-CM | POA: Diagnosis not present

## 2023-10-16 DIAGNOSIS — Z79620 Long term (current) use of immunosuppressive biologic: Secondary | ICD-10-CM | POA: Diagnosis not present

## 2023-10-16 DIAGNOSIS — R590 Localized enlarged lymph nodes: Secondary | ICD-10-CM | POA: Diagnosis not present

## 2023-10-16 DIAGNOSIS — E119 Type 2 diabetes mellitus without complications: Secondary | ICD-10-CM | POA: Diagnosis not present

## 2023-10-16 DIAGNOSIS — Z860100 Personal history of colon polyps, unspecified: Secondary | ICD-10-CM | POA: Diagnosis not present

## 2023-10-16 DIAGNOSIS — Z5112 Encounter for antineoplastic immunotherapy: Secondary | ICD-10-CM | POA: Diagnosis not present

## 2023-10-16 DIAGNOSIS — D701 Agranulocytosis secondary to cancer chemotherapy: Secondary | ICD-10-CM | POA: Diagnosis not present

## 2023-10-16 LAB — CBC WITH DIFFERENTIAL (CANCER CENTER ONLY)
Abs Immature Granulocytes: 0.01 10*3/uL (ref 0.00–0.07)
Basophils Absolute: 0 10*3/uL (ref 0.0–0.1)
Basophils Relative: 1 %
Eosinophils Absolute: 0.3 10*3/uL (ref 0.0–0.5)
Eosinophils Relative: 8 %
HCT: 46.5 % (ref 39.0–52.0)
Hemoglobin: 14.7 g/dL (ref 13.0–17.0)
Immature Granulocytes: 0 %
Lymphocytes Relative: 22 %
Lymphs Abs: 0.9 10*3/uL (ref 0.7–4.0)
MCH: 23.2 pg — ABNORMAL LOW (ref 26.0–34.0)
MCHC: 31.6 g/dL (ref 30.0–36.0)
MCV: 73.5 fL — ABNORMAL LOW (ref 80.0–100.0)
Monocytes Absolute: 0.6 10*3/uL (ref 0.1–1.0)
Monocytes Relative: 15 %
Neutro Abs: 2.2 10*3/uL (ref 1.7–7.7)
Neutrophils Relative %: 54 %
Platelet Count: 160 10*3/uL (ref 150–400)
RBC: 6.33 MIL/uL — ABNORMAL HIGH (ref 4.22–5.81)
RDW: 17.6 % — ABNORMAL HIGH (ref 11.5–15.5)
WBC Count: 4.1 10*3/uL (ref 4.0–10.5)
nRBC: 0 % (ref 0.0–0.2)

## 2023-10-16 LAB — CMP (CANCER CENTER ONLY)
ALT: 42 U/L (ref 0–44)
AST: 49 U/L — ABNORMAL HIGH (ref 15–41)
Albumin: 4.2 g/dL (ref 3.5–5.0)
Alkaline Phosphatase: 99 U/L (ref 38–126)
Anion gap: 10 (ref 5–15)
BUN: 25 mg/dL — ABNORMAL HIGH (ref 8–23)
CO2: 23 mmol/L (ref 22–32)
Calcium: 8.6 mg/dL — ABNORMAL LOW (ref 8.9–10.3)
Chloride: 106 mmol/L (ref 98–111)
Creatinine: 1.22 mg/dL (ref 0.61–1.24)
GFR, Estimated: >60 mL/min (ref >60)
Glucose, Bld: 169 mg/dL — ABNORMAL HIGH (ref 70–99)
Potassium: 3.9 mmol/L (ref 3.5–5.1)
Sodium: 139 mmol/L (ref 135–145)
Total Bilirubin: 0.4 mg/dL (ref <1.2)
Total Protein: 6.8 g/dL (ref 6.5–8.1)

## 2023-10-16 LAB — URIC ACID: Uric Acid, Serum: 6.8 mg/dL (ref 3.7–8.6)

## 2023-10-16 LAB — LACTATE DEHYDROGENASE: LDH: 339 U/L — ABNORMAL HIGH (ref 98–192)

## 2023-10-16 LAB — PHOSPHORUS: Phosphorus: 4 mg/dL (ref 2.5–4.6)

## 2023-10-16 MED ORDER — ACETAMINOPHEN 325 MG PO TABS
650.0000 mg | ORAL_TABLET | Freq: Once | ORAL | Status: AC
  Administered 2023-10-16: 650 mg via ORAL
  Filled 2023-10-16: qty 2

## 2023-10-16 MED ORDER — SODIUM CHLORIDE 0.9 % IV SOLN: INTRAVENOUS | Status: DC

## 2023-10-16 MED ORDER — DIPHENHYDRAMINE HCL 50 MG/ML IJ SOLN
50.0000 mg | Freq: Once | INTRAMUSCULAR | Status: AC
  Administered 2023-10-16: 50 mg via INTRAVENOUS
  Filled 2023-10-16: qty 1

## 2023-10-16 MED ORDER — SODIUM CHLORIDE 0.9 % IV SOLN
1000.0000 mg | Freq: Once | INTRAVENOUS | Status: AC
  Administered 2023-10-16: 1000 mg via INTRAVENOUS
  Filled 2023-10-16: qty 40

## 2023-10-16 MED ORDER — SODIUM CHLORIDE 0.9 % IV SOLN
20.0000 mg | Freq: Once | INTRAVENOUS | Status: AC
  Administered 2023-10-16: 20 mg via INTRAVENOUS
  Filled 2023-10-16: qty 20

## 2023-10-16 NOTE — Patient Instructions (Signed)
 [facility] - A DEPT OF [facility]  Discharge Instructions: Thank you for choosing [facility] to provide your oncology and hematology care.   If you have a lab appointment with the Cancer Center, please go directly to the Cancer Center and check in at the registration area.   Wear comfortable clothing and clothing appropriate for easy access to any Portacath or PICC line.   We strive to give you quality time with your provider. You may need to reschedule your appointment if you arrive late (15 or more minutes).  Arriving late affects you and other patients whose appointments are after yours.  Also, if you miss three or more appointments without notifying the office, you may be dismissed from the clinic at the provider's discretion.      For prescription refill requests, have your pharmacy contact our office and allow 72 hours for refills to be completed.    Today you received the following chemotherapy and/or immunotherapy agents: obinutuzumab      To help prevent nausea and vomiting after your treatment, we encourage you to take your nausea medication as directed.  BELOW ARE SYMPTOMS THAT SHOULD BE REPORTED IMMEDIATELY: *FEVER GREATER THAN 100.4 F (38 C) OR HIGHER *CHILLS OR SWEATING *NAUSEA AND VOMITING THAT IS NOT CONTROLLED WITH YOUR NAUSEA MEDICATION *UNUSUAL SHORTNESS OF BREATH *UNUSUAL BRUISING OR BLEEDING *URINARY PROBLEMS (pain or burning when urinating, or frequent urination) *BOWEL PROBLEMS (unusual diarrhea, constipation, pain near the anus) TENDERNESS IN MOUTH AND THROAT WITH OR WITHOUT PRESENCE OF ULCERS (sore throat, sores in mouth, or a toothache) UNUSUAL RASH, SWELLING OR PAIN  UNUSUAL VAGINAL DISCHARGE OR ITCHING   Items with * indicate a potential emergency and should be followed up as soon as possible or go to the Emergency Department if any problems should occur.  Please show the CHEMOTHERAPY ALERT CARD or  IMMUNOTHERAPY ALERT CARD at check-in to the Emergency Department and triage nurse.  Should you have questions after your visit or need to cancel or reschedule your appointment, please contact [facility] - A DEPT OF [facility]  Dept: [phone]  and follow the prompts.  Office hours are 8:00 a.m. to 4:30 p.m. Monday - Friday. Please note that voicemails left after 4:00 p.m. may not be returned until the following business day.  We are closed weekends and major holidays. You have access to a nurse at all times for urgent questions. Please call the main number to the clinic Dept: [phone] and follow the prompts.   For any non-urgent questions, you may also contact your provider using MyChart. We now offer e-Visits for anyone 51 and older to request care online for non-urgent symptoms. For details visit [email or web address].   Also download the MyChart app! Go to the app store, search "MyChart", open the app, select [place], and log in with your MyChart username and password.

## 2023-10-16 NOTE — Progress Notes (Unsigned)
Angel [name] OFFICE PROGRESS NOTE   Diagnosis: Small lymphocytic lymphoma  INTERVAL HISTORY:   Angel [name] returns as scheduled.  He completed day 8 Obinutuzumab 10/09/2023.  He reports tolerating treatment well.  No signs of reaction.  No rash.  He has occasional sweats.  No fever.  No chills.  He feels the palpable lymph nodes are smaller.  Objective:  Vital signs in last 24 hours:  Blood pressure 129/68, pulse 71, temperature 97.8 F (36.6 C), temperature source Temporal, resp. rate 18, height 5\' 10"  (1.778 m), weight 244 lb 11.2 oz (111 kg), SpO2 98%.    HEENT: No thrush or ulcers. Lymphatics: Bilateral cervical/scalene, axillary and inguinal lymph nodes again smaller. Resp: Lungs clear bilaterally. Cardio: Regular rate and rhythm. GI: No hepatosplenomegaly. Vascular: No leg edema.  Lab Results:  Lab Results  Component Value Date   WBC 4.1 10/16/2023   HGB 14.7 10/16/2023   HCT 46.5 10/16/2023   MCV 73.5 (L) 10/16/2023   PLT 160 10/16/2023   NEUTROABS 2.2 10/16/2023    Imaging:  No results found.  Medications: I have reviewed the patient's current medications.  Assessment/Plan: Colon cancer, sigmoid, stage IIIa (T1,N1), status post a partial colectomy 02/02/2017 1/26 lymph nodes positive MSI-stable, no loss of mismatch repair protein expression Staging CTs 12/29/2016-small indeterminate left lung nodules Normal preoperative CEA Cycle 1 CAPOX 03/03/2017 Cycle 2 CAPOX 03/24/2017 Cycle 3 CAPOX 04/14/2017 Cycle 4 CAPOX 05/05/2017 CTs 11/06/2018- negative for recurrent disease, small left lung nodule stable from 2007-consider benign Colonoscopy 05/29/2019-negative CTs 10/25/2019-mild increase in a 1 cm small bowel mesenteric lymph node, no evidence of recurrent disease CTs 10/20/2020-several small left-sided lung nodule stable when compared to prior CTs.  No evidence of metastatic disease in the abdomen or pelvis.  Diffuse fatty infiltration of the  liver.     2.  Indeterminate left lung nodules on a chest CT 12/29/2016, stable lung nodules on CT 10/25/2019   3.   Chronic red cell microcytosis   4.   Diabetes   5.   Hypertension   6.   Family history of colon cancer   7.   Multiple polyps noted on the colonoscopy 12/19/2016   8.   Neutropenia secondary to chemotherapy   9.   Extensive peripheral adenopathy on exam 08/21/2023-CTs neck through pelvis 08/24/2023 with extensive lymphadenopathy neck, chest, abdomen and pelvis.  Scattered tiny lung nodule stable from prior examination and compatible with a benign finding. 09/08/2023-right axillary lymph node biopsy-necrotic lymph node 09/22/2023-left axillary lymph node biopsy-75% of lymphocytes with CD5/CD200 positive lambda restricted on flow cytometry, surgical otology with a CD5 positive B-cell lymphoma, CD20, PAX5, cyclin D1 negative, perforation right 5-10%, CLL/SLL favored Cycle 1 day 1 obinutuzumab 10/03/2023, day 2 10/04/2023, day 8 10/09/2023, day 15 10/16/2023 Venetoclax 10/23/2023, ramp-up schedule   10.  Positive hepatitis B core antibody and negative surface antigen 09/28/2023-repeat labs in 4 weeks. 11.  Herpetic lip lesions 10/09/2023-Valtrex    Disposition: Angel [name] appears stable.  He seems to be tolerating Obinutuzumab well.  Plan to proceed with day 15 today as scheduled.  He will begin venetoclax day 22 on a ramp-up schedule.  We again reviewed potential side effects associated with venetoclax.  He was provided with printed information as well.  He will return for labs on 10/23/2023 with the plan to begin venetoclax that day.  He is in agreement.  CBC and chemistry panel reviewed.  Labs adequate to proceed with day 15 Obinutuzumab today as  scheduled.  Lab in 1 week.  Labs/office visit/obinutuzumab in 2 weeks.  Patient seen with Dr. Truett Perna.    Angel [name] ANP/GNP-BC   10/16/2023  9:26 AM  This was a shared visit with Angel [name].  Angel [name] was  interviewed and examined.  He has tolerated the obinutuzumab well.  The palpable lymph nodes are smaller.  He does not have evidence of tumor lysis syndrome.  The plan is to begin venetoclax and day 22.  We reviewed potential toxicities associated with venetoclax including the chance of tumor lysis syndrome.  He agrees to proceed.  I was present for greater than 50% of today's visit.  I performed medical decision making.  Mancel Bale, MD

## 2023-10-16 NOTE — Progress Notes (Unsigned)
Patient seen by Lonna Cobb NP today  Vitals are within treatment parameters:Yes   Labs are within treatment parameters: Yes   Treatment plan has been signed: Yes   Per physician team, Patient is ready for treatment and there are NO modifications to the treatment plan.

## 2023-10-17 ENCOUNTER — Encounter: Payer: Self-pay | Admitting: Oncology

## 2023-10-17 ENCOUNTER — Other Ambulatory Visit: Payer: Self-pay

## 2023-10-18 ENCOUNTER — Other Ambulatory Visit: Payer: Self-pay

## 2023-10-19 LAB — SURGICAL PATHOLOGY

## 2023-10-23 ENCOUNTER — Inpatient Hospital Stay: Payer: 59

## 2023-10-23 DIAGNOSIS — Z5112 Encounter for antineoplastic immunotherapy: Secondary | ICD-10-CM | POA: Diagnosis not present

## 2023-10-23 DIAGNOSIS — E119 Type 2 diabetes mellitus without complications: Secondary | ICD-10-CM | POA: Diagnosis not present

## 2023-10-23 DIAGNOSIS — R911 Solitary pulmonary nodule: Secondary | ICD-10-CM | POA: Diagnosis not present

## 2023-10-23 DIAGNOSIS — Z79620 Long term (current) use of immunosuppressive biologic: Secondary | ICD-10-CM | POA: Diagnosis not present

## 2023-10-23 DIAGNOSIS — D701 Agranulocytosis secondary to cancer chemotherapy: Secondary | ICD-10-CM | POA: Diagnosis not present

## 2023-10-23 DIAGNOSIS — I10 Essential (primary) hypertension: Secondary | ICD-10-CM | POA: Diagnosis not present

## 2023-10-23 DIAGNOSIS — K760 Fatty (change of) liver, not elsewhere classified: Secondary | ICD-10-CM | POA: Diagnosis not present

## 2023-10-23 DIAGNOSIS — Z800 Family history of malignant neoplasm of digestive organs: Secondary | ICD-10-CM | POA: Diagnosis not present

## 2023-10-23 DIAGNOSIS — R718 Other abnormality of red blood cells: Secondary | ICD-10-CM | POA: Diagnosis not present

## 2023-10-23 DIAGNOSIS — C8300 Small cell B-cell lymphoma, unspecified site: Secondary | ICD-10-CM

## 2023-10-23 DIAGNOSIS — Z860100 Personal history of colon polyps, unspecified: Secondary | ICD-10-CM | POA: Diagnosis not present

## 2023-10-23 DIAGNOSIS — Z79899 Other long term (current) drug therapy: Secondary | ICD-10-CM | POA: Diagnosis not present

## 2023-10-23 DIAGNOSIS — T451X5A Adverse effect of antineoplastic and immunosuppressive drugs, initial encounter: Secondary | ICD-10-CM | POA: Diagnosis not present

## 2023-10-23 DIAGNOSIS — C8514 Unspecified B-cell lymphoma, lymph nodes of axilla and upper limb: Secondary | ICD-10-CM | POA: Diagnosis not present

## 2023-10-23 DIAGNOSIS — R590 Localized enlarged lymph nodes: Secondary | ICD-10-CM | POA: Diagnosis not present

## 2023-10-23 LAB — CMP (CANCER CENTER ONLY)
ALT: 50 U/L — ABNORMAL HIGH (ref 0–44)
AST: 49 U/L — ABNORMAL HIGH (ref 15–41)
Albumin: 4.1 g/dL (ref 3.5–5.0)
Alkaline Phosphatase: 106 U/L (ref 38–126)
Anion gap: 11 (ref 5–15)
BUN: 18 mg/dL (ref 8–23)
CO2: 27 mmol/L (ref 22–32)
Calcium: 9.7 mg/dL (ref 8.9–10.3)
Chloride: 100 mmol/L (ref 98–111)
Creatinine: 1.23 mg/dL (ref 0.61–1.24)
GFR, Estimated: >60 mL/min (ref >60)
Glucose, Bld: 160 mg/dL — ABNORMAL HIGH (ref 70–99)
Potassium: 3.8 mmol/L (ref 3.5–5.1)
Sodium: 138 mmol/L (ref 135–145)
Total Bilirubin: 0.5 mg/dL (ref <1.2)
Total Protein: 6.8 g/dL (ref 6.5–8.1)

## 2023-10-23 LAB — CBC WITH DIFFERENTIAL (CANCER CENTER ONLY)
Abs Immature Granulocytes: 0.01 10*3/uL (ref 0.00–0.07)
Basophils Absolute: 0 10*3/uL (ref 0.0–0.1)
Basophils Relative: 1 %
Eosinophils Absolute: 0.3 10*3/uL (ref 0.0–0.5)
Eosinophils Relative: 6 %
HCT: 43.9 % (ref 39.0–52.0)
Hemoglobin: 13.9 g/dL (ref 13.0–17.0)
Immature Granulocytes: 0 %
Lymphocytes Relative: 19 %
Lymphs Abs: 0.7 10*3/uL (ref 0.7–4.0)
MCH: 22.9 pg — ABNORMAL LOW (ref 26.0–34.0)
MCHC: 31.7 g/dL (ref 30.0–36.0)
MCV: 72.4 fL — ABNORMAL LOW (ref 80.0–100.0)
Monocytes Absolute: 0.4 10*3/uL (ref 0.1–1.0)
Monocytes Relative: 11 %
Neutro Abs: 2.5 10*3/uL (ref 1.7–7.7)
Neutrophils Relative %: 63 %
Platelet Count: 147 10*3/uL — ABNORMAL LOW (ref 150–400)
RBC: 6.06 MIL/uL — ABNORMAL HIGH (ref 4.22–5.81)
RDW: 16.2 % — ABNORMAL HIGH (ref 11.5–15.5)
WBC Count: 3.9 10*3/uL — ABNORMAL LOW (ref 4.0–10.5)
nRBC: 0 % (ref 0.0–0.2)

## 2023-10-23 LAB — LACTATE DEHYDROGENASE: LDH: 338 U/L — ABNORMAL HIGH (ref 98–192)

## 2023-10-23 LAB — PHOSPHORUS: Phosphorus: 4.3 mg/dL (ref 2.5–4.6)

## 2023-10-23 LAB — URIC ACID: Uric Acid, Serum: 5.9 mg/dL (ref 3.7–8.6)

## 2023-10-24 ENCOUNTER — Other Ambulatory Visit: Payer: Self-pay | Admitting: *Deleted

## 2023-10-24 ENCOUNTER — Other Ambulatory Visit (HOSPITAL_COMMUNITY): Payer: Self-pay

## 2023-10-24 ENCOUNTER — Encounter: Payer: Self-pay | Admitting: Oncology

## 2023-10-24 ENCOUNTER — Telehealth: Payer: Self-pay

## 2023-10-24 MED ORDER — VENETOCLAX 100 MG PO TABS: ORAL_TABLET | ORAL | 0 refills | Status: DC

## 2023-10-24 MED ORDER — VENETOCLAX 10 MG PO TABS: ORAL_TABLET | ORAL | 0 refills | Status: DC

## 2023-10-24 NOTE — Progress Notes (Signed)
Placed orders per Dr. Truett Perna for venetoclax (needs to start asap). Oral oncology team notified. 20 mg every day x 7 days 50 mg every day x 7 days 100 mg every day x 7 days 200 mg every day x 7 days 400 mg every day x 7 days

## 2023-10-24 NOTE — Telephone Encounter (Signed)
Oral Oncology Patient Advocate Encounter  Prior Authorization for Angel [name] has been approved.    PA# [id number]  Effective dates: 09/24/23 through 10/23/24  Patients co-pay is $4,913.01.   Co-Pay Card obtained to make co-pay ~$0.00.   Ardeen Fillers, CPhT Oncology Pharmacy Patient Advocate  [facility]  [phone] (phone) [phone] (fax) 10/24/2023 3:56 PM

## 2023-10-24 NOTE — Telephone Encounter (Signed)
Oral Oncology Patient Advocate Encounter  New authorization   Received notification that prior authorization for Venclexta is required.   PA submitted on 10/24/23  Key [id number]  Status is pending     Ardeen Fillers, CPhT Oncology Pharmacy Patient Advocate  [facility]  [phone] (phone) [phone] (fax) 10/24/2023 3:55 PM

## 2023-10-25 ENCOUNTER — Telehealth: Payer: Self-pay

## 2023-10-25 ENCOUNTER — Telehealth: Payer: Self-pay | Admitting: Pharmacist

## 2023-10-25 ENCOUNTER — Other Ambulatory Visit (HOSPITAL_COMMUNITY): Payer: Self-pay

## 2023-10-25 ENCOUNTER — Other Ambulatory Visit: Payer: Self-pay

## 2023-10-25 ENCOUNTER — Encounter: Payer: Self-pay | Admitting: Oncology

## 2023-10-25 DIAGNOSIS — C8300 Small cell B-cell lymphoma, unspecified site: Secondary | ICD-10-CM

## 2023-10-25 MED ORDER — VENETOCLAX 10 & 50 & 100 MG PO TBPK
ORAL_TABLET | ORAL | 0 refills | Status: DC
  Filled 2023-10-25: qty 42, 28d supply, fill #0

## 2023-10-25 NOTE — Telephone Encounter (Signed)
Clinical Pharmacist Practitioner Encounter   Patient to pick up Venclexta from [facility] (Special[facility]) today 10/25/23. He will get started when he has medication in hand.   Patient Education I spoke with patient for overview of new oral chemotherapy medication: Venclexta (venetoclax) for the treatment of SLL in conjunction with obinutuzumab (already started), planned duration of one years.   Counseled patient on administration, dosing, side effects, monitoring, drug-food interactions, safe handling, storage, and disposal. Patient will take by mouth daily. Take 20 mg for 7 days, then 50 mg daily x 7d, then 100 mg daily x 7d, then 200 mg daily x 7d. Take with food & water.  Side effects include but not limited to: diarrhea, nausea, fatigue, decreased wbc/hgb/plt.    Reviewed with patient importance of keeping a medication schedule and plan for any missed doses.  After discussion with patient no patient barriers to medication adherence identified.   Angel [name] voiced understanding and appreciation. All questions answered. Medication handout provided.  Provided patient with Oral Chemotherapy Navigation Clinic phone number. Patient knows to call the office with questions or concerns. Oral Chemotherapy Navigation Clinic will continue to follow.  Remi Haggard, PharmD, BCPS, BCOP, CPP Hematology/Oncology Clinical Pharmacist Practitioner [place] [phone]  10/25/2023 12:06 PM

## 2023-10-25 NOTE — Telephone Encounter (Signed)
Oral Oncology Patient Advocate Encounter   Was successful in obtaining a copay card for Venclexta.  This copay card will make the patients copay ~$0.00.  The billing information is as follows and has been shared with [facility].   RxBin: [id number] PCN: [id number] Member ID: [id number] Group ID: [id number]   Ardeen Fillers, CPhT Oncology Pharmacy Patient Advocate  [facility]  [phone] (phone) [phone] (fax) 10/25/2023 8:47 AM

## 2023-10-25 NOTE — Telephone Encounter (Signed)
Patient successfully OnBoarded and drug education provided by pharmacist. Medication scheduled to be picked up on Wednesday, 10/25/23, from [facility]. Patient also knows to call me at [phone] with any questions or concerns regarding receiving medication or if there is any unexpected change in co-pay.    Ardeen Fillers, CPhT Oncology Pharmacy Patient Advocate  [facility]  [phone] (phone) [phone] (fax) 10/25/2023 10:02 AM

## 2023-10-25 NOTE — Progress Notes (Signed)
Specialty Pharmacy Initial Fill Coordination Note  Angel [name] is a 72 y.o. male contacted today regarding initial fill of specialty medication(s) Venetoclax   Patient requested Daryll Drown at [facility] at [place] date: 10/25/23   Medication will be filled on 10/25/23.   Patient is aware of $0.00 copayment.    Ardeen Fillers, CPhT Oncology Pharmacy Patient Advocate  [facility]  [phone] (phone) [phone] (fax) 10/25/2023 9:57 AM

## 2023-10-25 NOTE — Telephone Encounter (Signed)
Clinical Pharmacist Practitioner Encounter   Received new prescription for Venclexta (venetoclax) for the treatment of SLL in conjunction with obinutuzumab (already started), planned duration of one years.  CMP from 10/23/23 assessed, AST/ALT only slightly elevated continue to monitor. Would need to dose decrease venetoclax for severe hepatic impairment. Prescription dose and frequency assessed.   Current medication list in Epic reviewed, several DDIs with venetoclax identified: Venetoclax may dimish the therapeutic effect of antidiabetic agents. Monitor blood glucose and dose adjust antidiabetic medications as needed.  Evaluated chart and no patient barriers to medication adherence identified.   Prescription has been e-scribed to the [facility] for benefits analysis and approval.  Oral Oncology Clinic will continue to follow for insurance authorization, copayment issues, initial counseling and start date.  Patient agreed to treatment on 10/16/23 per MD documentation.  Remi Haggard, PharmD, BCPS, BCOP, CPP Hematology/Oncology Clinical Pharmacist Practitioner [place] [phone]  10/25/2023 9:03 AM

## 2023-10-25 NOTE — Progress Notes (Signed)
Patient education documented in EPIC note on 10/25/23.

## 2023-10-28 ENCOUNTER — Other Ambulatory Visit: Payer: Self-pay | Admitting: Oncology

## 2023-10-31 ENCOUNTER — Inpatient Hospital Stay: Payer: 59 | Attending: Oncology | Admitting: Nurse Practitioner

## 2023-10-31 ENCOUNTER — Other Ambulatory Visit: Payer: Self-pay

## 2023-10-31 ENCOUNTER — Inpatient Hospital Stay: Payer: 59

## 2023-10-31 ENCOUNTER — Encounter: Payer: Self-pay | Admitting: Nurse Practitioner

## 2023-10-31 ENCOUNTER — Ambulatory Visit (HOSPITAL_BASED_OUTPATIENT_CLINIC_OR_DEPARTMENT_OTHER)
Admission: RE | Admit: 2023-10-31 | Discharge: 2023-10-31 | Disposition: A | Discharge status: Home / Self Care | Payer: 59 | Source: Ambulatory Visit | Attending: Nurse Practitioner | Admitting: Nurse Practitioner

## 2023-10-31 VITALS — BP 110/60 | HR 82 | Temp 98.2°F | Resp 20 | Ht 70.0 in | Wt 242.5 lb

## 2023-10-31 DIAGNOSIS — I10 Essential (primary) hypertension: Secondary | ICD-10-CM | POA: Diagnosis not present

## 2023-10-31 DIAGNOSIS — R5383 Other fatigue: Secondary | ICD-10-CM | POA: Diagnosis not present

## 2023-10-31 DIAGNOSIS — E119 Type 2 diabetes mellitus without complications: Secondary | ICD-10-CM | POA: Insufficient documentation

## 2023-10-31 DIAGNOSIS — C8514 Unspecified B-cell lymphoma, lymph nodes of axilla and upper limb: Secondary | ICD-10-CM | POA: Diagnosis not present

## 2023-10-31 DIAGNOSIS — E860 Dehydration: Secondary | ICD-10-CM

## 2023-10-31 DIAGNOSIS — R590 Localized enlarged lymph nodes: Secondary | ICD-10-CM | POA: Insufficient documentation

## 2023-10-31 DIAGNOSIS — Z79899 Other long term (current) drug therapy: Secondary | ICD-10-CM | POA: Diagnosis not present

## 2023-10-31 DIAGNOSIS — Z5112 Encounter for antineoplastic immunotherapy: Secondary | ICD-10-CM | POA: Insufficient documentation

## 2023-10-31 DIAGNOSIS — R918 Other nonspecific abnormal finding of lung field: Secondary | ICD-10-CM | POA: Insufficient documentation

## 2023-10-31 DIAGNOSIS — D701 Agranulocytosis secondary to cancer chemotherapy: Secondary | ICD-10-CM | POA: Diagnosis not present

## 2023-10-31 DIAGNOSIS — C8300 Small cell B-cell lymphoma, unspecified site: Secondary | ICD-10-CM

## 2023-10-31 DIAGNOSIS — T451X5A Adverse effect of antineoplastic and immunosuppressive drugs, initial encounter: Secondary | ICD-10-CM | POA: Diagnosis not present

## 2023-10-31 DIAGNOSIS — R718 Other abnormality of red blood cells: Secondary | ICD-10-CM | POA: Diagnosis not present

## 2023-10-31 DIAGNOSIS — G479 Sleep disorder, unspecified: Secondary | ICD-10-CM | POA: Insufficient documentation

## 2023-10-31 DIAGNOSIS — K760 Fatty (change of) liver, not elsewhere classified: Secondary | ICD-10-CM | POA: Diagnosis not present

## 2023-10-31 DIAGNOSIS — R0600 Dyspnea, unspecified: Secondary | ICD-10-CM | POA: Diagnosis not present

## 2023-10-31 DIAGNOSIS — Z860100 Personal history of colon polyps, unspecified: Secondary | ICD-10-CM | POA: Diagnosis not present

## 2023-10-31 DIAGNOSIS — C8590 Non-Hodgkin lymphoma, unspecified, unspecified site: Secondary | ICD-10-CM | POA: Diagnosis not present

## 2023-10-31 DIAGNOSIS — Z800 Family history of malignant neoplasm of digestive organs: Secondary | ICD-10-CM | POA: Diagnosis not present

## 2023-10-31 DIAGNOSIS — C187 Malignant neoplasm of sigmoid colon: Secondary | ICD-10-CM | POA: Diagnosis not present

## 2023-10-31 DIAGNOSIS — R059 Cough, unspecified: Secondary | ICD-10-CM | POA: Diagnosis not present

## 2023-10-31 LAB — CMP (CANCER CENTER ONLY)
ALT: 38 U/L (ref 0–44)
AST: 38 U/L (ref 15–41)
Albumin: 4.4 g/dL (ref 3.5–5.0)
Alkaline Phosphatase: 112 U/L (ref 38–126)
Anion gap: 10 (ref 5–15)
BUN: 19 mg/dL (ref 8–23)
CO2: 24 mmol/L (ref 22–32)
Calcium: 9.4 mg/dL (ref 8.9–10.3)
Chloride: 104 mmol/L (ref 98–111)
Creatinine: 1.15 mg/dL (ref 0.61–1.24)
GFR, Estimated: >60 mL/min (ref >60)
Glucose, Bld: 257 mg/dL — ABNORMAL HIGH (ref 70–99)
Potassium: 4.4 mmol/L (ref 3.5–5.1)
Sodium: 138 mmol/L (ref 135–145)
Total Bilirubin: 0.5 mg/dL (ref <1.2)
Total Protein: 6.9 g/dL (ref 6.5–8.1)

## 2023-10-31 LAB — CBC WITH DIFFERENTIAL (CANCER CENTER ONLY)
Abs Immature Granulocytes: 0.01 10*3/uL (ref 0.00–0.07)
Basophils Absolute: 0 10*3/uL (ref 0.0–0.1)
Basophils Relative: 1 %
Eosinophils Absolute: 0.3 10*3/uL (ref 0.0–0.5)
Eosinophils Relative: 9 %
HCT: 46.8 % (ref 39.0–52.0)
Hemoglobin: 14.8 g/dL (ref 13.0–17.0)
Immature Granulocytes: 0 %
Lymphocytes Relative: 29 %
Lymphs Abs: 0.9 10*3/uL (ref 0.7–4.0)
MCH: 23.5 pg — ABNORMAL LOW (ref 26.0–34.0)
MCHC: 31.6 g/dL (ref 30.0–36.0)
MCV: 74.2 fL — ABNORMAL LOW (ref 80.0–100.0)
Monocytes Absolute: 0.5 10*3/uL (ref 0.1–1.0)
Monocytes Relative: 16 %
Neutro Abs: 1.5 10*3/uL — ABNORMAL LOW (ref 1.7–7.7)
Neutrophils Relative %: 45 %
Platelet Count: 158 10*3/uL (ref 150–400)
RBC: 6.31 MIL/uL — ABNORMAL HIGH (ref 4.22–5.81)
RDW: 18.1 % — ABNORMAL HIGH (ref 11.5–15.5)
WBC Count: 3.3 10*3/uL — ABNORMAL LOW (ref 4.0–10.5)
nRBC: 0 % (ref 0.0–0.2)

## 2023-10-31 LAB — URIC ACID: Uric Acid, Serum: 6.2 mg/dL (ref 3.7–8.6)

## 2023-10-31 LAB — LACTATE DEHYDROGENASE: LDH: 301 U/L — ABNORMAL HIGH (ref 98–192)

## 2023-10-31 LAB — PHOSPHORUS: Phosphorus: 4.1 mg/dL (ref 2.5–4.6)

## 2023-10-31 MED ORDER — SODIUM CHLORIDE 0.9 % IV SOLN: INTRAVENOUS | Status: AC

## 2023-10-31 NOTE — Progress Notes (Unsigned)
[facility] OFFICE PROGRESS NOTE   Diagnosis: Small lymphocytic lymphoma  INTERVAL HISTORY:   Angel [name] returns as scheduled.  He completed cycle 1 day 15 obinutuzumab 10/16/2023.  He began venetoclax on a ramp-up schedule 10/29/2023.  He denies nausea/vomiting.  He notes mouth is tender, no ulcers.  No rash.  No diarrhea.  His wife reports he has been very "sleepy" since starting the obinutuzumab.  He is having difficulty sleeping at nighttime.  He sleeps during the day.  No confusion.  No falls.  No fever or chills.  No shortness of breath or cough.  No urinary symptoms.  He reports blood sugar was "very high" this morning, mid 200s.  Objective:  Vital signs in last 24 hours:  Blood pressure 98/70, pulse 100, temperature 98.1 F (36.7 C), temperature source Temporal, resp. rate 18, height 5\' 10"  (1.778 m), weight 242 lb 8 oz (110 kg), SpO2 96%.    Lymphatics: Small bilateral cervical/scalene lymph nodes.  Axillary and inguinal lymph nodes bilaterally, smaller. Resp: Faint crackles right lower lung field.  No respiratory distress. Cardio: Regular rate and rhythm. GI: No hepatosplenomegaly. Vascular: No leg edema. Neuro: Lethargic, follows commands.  Slow to respond to questions.  ? favoring right leg.  Repeat neurologic exam at 4 PM-more alert, oriented, follows commands.  Moves all extremities.  Motor strength 5/5.    Lab Results:  Lab Results  Component Value Date   WBC 3.9 (L) 10/23/2023   HGB 13.9 10/23/2023   HCT 43.9 10/23/2023   MCV 72.4 (L) 10/23/2023   PLT 147 (L) 10/23/2023   NEUTROABS 2.5 10/23/2023    Imaging:  No results found.  Medications: I have reviewed the patient's current medications.  Assessment/Plan: Colon cancer, sigmoid, stage IIIa (T1,N1), status post a partial colectomy 02/02/2017 1/26 lymph nodes positive MSI-stable, no loss of mismatch repair protein expression Staging CTs 12/29/2016-small indeterminate left lung  nodules Normal preoperative CEA Cycle 1 CAPOX 03/03/2017 Cycle 2 CAPOX 03/24/2017 Cycle 3 CAPOX 04/14/2017 Cycle 4 CAPOX 05/05/2017 CTs 11/06/2018- negative for recurrent disease, small left lung nodule stable from 2007-consider benign Colonoscopy 05/29/2019-negative CTs 10/25/2019-mild increase in a 1 cm small bowel mesenteric lymph node, no evidence of recurrent disease CTs 10/20/2020-several small left-sided lung nodule stable when compared to prior CTs.  No evidence of metastatic disease in the abdomen or pelvis.  Diffuse fatty infiltration of the liver.     2.  Indeterminate left lung nodules on a chest CT 12/29/2016, stable lung nodules on CT 10/25/2019   3.   Chronic red cell microcytosis   4.   Diabetes   5.   Hypertension   6.   Family history of colon cancer   7.   Multiple polyps noted on the colonoscopy 12/19/2016   8.   Neutropenia secondary to chemotherapy   9.   Extensive peripheral adenopathy on exam 08/21/2023-CTs neck through pelvis 08/24/2023 with extensive lymphadenopathy neck, chest, abdomen and pelvis.  Scattered tiny lung nodule stable from prior examination and compatible with a benign finding. 09/08/2023-right axillary lymph node biopsy-necrotic lymph node 09/22/2023-left axillary lymph node biopsy-75% of lymphocytes with CD5/CD200 positive lambda restricted on flow cytometry, surgical otology with a CD5 positive B-cell lymphoma, CD20, PAX5, cyclin D1 negative, perforation right 5-10%, CLL/SLL favored Cycle 1 day 1 obinutuzumab 10/03/2023, day 2 10/04/2023, day 8 10/09/2023, day 15 10/16/2023 Venetoclax 10/29/2023, ramp-up schedule Obinutuzumab and venetoclax placed on hold 10/31/2023   10.  Positive hepatitis B core antibody and negative  surface antigen 09/28/2023-repeat labs in 4 weeks. 11.  Herpetic lip lesions 10/09/2023-Valtrex  Disposition: Angel [name] presents to the office today for routine follow-up prior to beginning cycle 2 obinutuzumab.  He was noted  to be lethargic, mildly hypotensive.  Laboratory evaluation unremarkable except mild neutropenia.  No fever or other signs of infection.  No evidence of tumor lysis syndrome.  Chest x-ray negative for pneumonia.  He received intravenous hydration during the course of the day.  Vital signs monitored.  He was more alert following the IV fluids.  Improvement in blood pressure.  Etiology of the lethargy is unclear.  We held the obinutuzumab.  He will hold further venetoclax.  He will return for a follow-up visit in 2 days.  He and his wife understand he should not be operating a motor vehicle.  If the lethargy recurs or he develops any other concerning symptoms he will go to the emergency department.  Patient seen with Dr. Truett Perna.    Lonna Cobb ANP/GNP-BC   10/31/2023  10:06 AM This was a shared visit with Lonna Cobb.  Angel [name] was interviewed and examined.  He presented for scheduled obinutuzumab.  He began venetoclax 2 days ago.  The palpable lymphadenopathy is significantly improved.  He was urgent care and somnolent upon arrival to the Cancer center.  The etiology of his symptoms remains unclear.  He does not appear to have tumor lysis syndrome.  We had multiple discussions with Angel [name] and his wife throughout the day.  He was examined several times.  They report he has not been sleeping at night.  He took NyQuil last night, but no other sedating medications.  The blood sugar was not severely elevated.  His clinical status for improved throughout the day and he appears stable for discharge to home.  Venetoclax will be placed on hold.  He did not receive obinutuzumab today.  He will return for further evaluation on 11/02/2023.  I was present for greater than 50% of today's visit.  I performed medical decision making.  Mancel Bale, MD

## 2023-10-31 NOTE — Patient Instructions (Signed)

## 2023-11-01 ENCOUNTER — Other Ambulatory Visit (HOSPITAL_COMMUNITY): Payer: Self-pay

## 2023-11-01 ENCOUNTER — Encounter: Payer: Self-pay | Admitting: Oncology

## 2023-11-01 ENCOUNTER — Telehealth: Payer: Self-pay

## 2023-11-01 NOTE — Telephone Encounter (Signed)
I contacted the patient to conduct a follow-up. He reported that he is doing well and slept overnight, waking up around 2:00 AM. He also mentioned that he went to work and does not feel fatigued. He is planning to come in tomorrow for his follow-up appointment. Additionally, he is currently driving but has been advised by the provider not to drive until his follow-up appointment.

## 2023-11-02 ENCOUNTER — Other Ambulatory Visit: Payer: Self-pay

## 2023-11-02 ENCOUNTER — Inpatient Hospital Stay: Payer: 59

## 2023-11-02 ENCOUNTER — Observation Stay (HOSPITAL_COMMUNITY)
Admission: AD | Admit: 2023-11-02 | Discharge: 2023-11-03 | Disposition: A | Discharge status: Home / Self Care | Payer: 59 | Source: Ambulatory Visit | Attending: Internal Medicine | Admitting: Internal Medicine

## 2023-11-02 ENCOUNTER — Inpatient Hospital Stay (HOSPITAL_BASED_OUTPATIENT_CLINIC_OR_DEPARTMENT_OTHER): Payer: 59 | Admitting: Nurse Practitioner

## 2023-11-02 ENCOUNTER — Encounter (HOSPITAL_COMMUNITY): Payer: Self-pay | Admitting: Internal Medicine

## 2023-11-02 ENCOUNTER — Encounter (HOSPITAL_COMMUNITY): Payer: Self-pay

## 2023-11-02 ENCOUNTER — Encounter: Payer: Self-pay | Admitting: Nurse Practitioner

## 2023-11-02 VITALS — BP 98/56 | HR 109 | Temp 97.7°F | Ht 70.0 in | Wt 247.4 lb

## 2023-11-02 DIAGNOSIS — Z79899 Other long term (current) drug therapy: Secondary | ICD-10-CM | POA: Diagnosis not present

## 2023-11-02 DIAGNOSIS — C9110 Chronic lymphocytic leukemia of B-cell type not having achieved remission: Secondary | ICD-10-CM | POA: Insufficient documentation

## 2023-11-02 DIAGNOSIS — Z7984 Long term (current) use of oral hypoglycemic drugs: Secondary | ICD-10-CM | POA: Diagnosis not present

## 2023-11-02 DIAGNOSIS — E860 Dehydration: Secondary | ICD-10-CM

## 2023-11-02 DIAGNOSIS — D649 Anemia, unspecified: Secondary | ICD-10-CM

## 2023-11-02 DIAGNOSIS — Z85038 Personal history of other malignant neoplasm of large intestine: Secondary | ICD-10-CM | POA: Insufficient documentation

## 2023-11-02 DIAGNOSIS — C187 Malignant neoplasm of sigmoid colon: Secondary | ICD-10-CM

## 2023-11-02 DIAGNOSIS — R2689 Other abnormalities of gait and mobility: Secondary | ICD-10-CM | POA: Diagnosis not present

## 2023-11-02 DIAGNOSIS — C8510 Unspecified B-cell lymphoma, unspecified site: Secondary | ICD-10-CM | POA: Insufficient documentation

## 2023-11-02 DIAGNOSIS — R400 Somnolence: Secondary | ICD-10-CM

## 2023-11-02 DIAGNOSIS — Z6835 Body mass index (BMI) 35.0-35.9, adult: Secondary | ICD-10-CM | POA: Diagnosis not present

## 2023-11-02 DIAGNOSIS — Z87891 Personal history of nicotine dependence: Secondary | ICD-10-CM | POA: Diagnosis not present

## 2023-11-02 DIAGNOSIS — Z7985 Long-term (current) use of injectable non-insulin antidiabetic drugs: Secondary | ICD-10-CM | POA: Insufficient documentation

## 2023-11-02 DIAGNOSIS — E119 Type 2 diabetes mellitus without complications: Secondary | ICD-10-CM

## 2023-11-02 DIAGNOSIS — C8300 Small cell B-cell lymphoma, unspecified site: Secondary | ICD-10-CM

## 2023-11-02 DIAGNOSIS — Z7982 Long term (current) use of aspirin: Secondary | ICD-10-CM | POA: Diagnosis not present

## 2023-11-02 DIAGNOSIS — I10 Essential (primary) hypertension: Secondary | ICD-10-CM | POA: Insufficient documentation

## 2023-11-02 DIAGNOSIS — E1165 Type 2 diabetes mellitus with hyperglycemia: Secondary | ICD-10-CM | POA: Insufficient documentation

## 2023-11-02 DIAGNOSIS — R5383 Other fatigue: Secondary | ICD-10-CM | POA: Diagnosis not present

## 2023-11-02 DIAGNOSIS — G4700 Insomnia, unspecified: Secondary | ICD-10-CM

## 2023-11-02 LAB — LACTATE DEHYDROGENASE: LDH: 271 U/L — ABNORMAL HIGH (ref 98–192)

## 2023-11-02 LAB — CMP (CANCER CENTER ONLY)
ALT: 31 U/L (ref 0–44)
AST: 31 U/L (ref 15–41)
Albumin: 3.9 g/dL (ref 3.5–5.0)
Alkaline Phosphatase: 98 U/L (ref 38–126)
Anion gap: 10 (ref 5–15)
BUN: 23 mg/dL (ref 8–23)
CO2: 23 mmol/L (ref 22–32)
Calcium: 9.3 mg/dL (ref 8.9–10.3)
Chloride: 108 mmol/L (ref 98–111)
Creatinine: 1.08 mg/dL (ref 0.61–1.24)
GFR, Estimated: >60 mL/min (ref >60)
Glucose, Bld: 229 mg/dL — ABNORMAL HIGH (ref 70–99)
Potassium: 4 mmol/L (ref 3.5–5.1)
Sodium: 141 mmol/L (ref 135–145)
Total Bilirubin: 0.4 mg/dL (ref <1.2)
Total Protein: 6.4 g/dL — ABNORMAL LOW (ref 6.5–8.1)

## 2023-11-02 LAB — CBC WITH DIFFERENTIAL (CANCER CENTER ONLY)
Abs Immature Granulocytes: 0.02 10*3/uL (ref 0.00–0.07)
Basophils Absolute: 0 10*3/uL (ref 0.0–0.1)
Basophils Relative: 1 %
Eosinophils Absolute: 0.2 10*3/uL (ref 0.0–0.5)
Eosinophils Relative: 7 %
HCT: 42.6 % (ref 39.0–52.0)
Hemoglobin: 13.4 g/dL (ref 13.0–17.0)
Immature Granulocytes: 1 %
Lymphocytes Relative: 30 %
Lymphs Abs: 1.1 10*3/uL (ref 0.7–4.0)
MCH: 23.3 pg — ABNORMAL LOW (ref 26.0–34.0)
MCHC: 31.5 g/dL (ref 30.0–36.0)
MCV: 74 fL — ABNORMAL LOW (ref 80.0–100.0)
Monocytes Absolute: 0.7 10*3/uL (ref 0.1–1.0)
Monocytes Relative: 21 %
Neutro Abs: 1.4 10*3/uL — ABNORMAL LOW (ref 1.7–7.7)
Neutrophils Relative %: 40 %
Platelet Count: 154 10*3/uL (ref 150–400)
RBC: 5.76 MIL/uL (ref 4.22–5.81)
RDW: 17.2 % — ABNORMAL HIGH (ref 11.5–15.5)
WBC Count: 3.5 10*3/uL — ABNORMAL LOW (ref 4.0–10.5)
nRBC: 0 % (ref 0.0–0.2)

## 2023-11-02 LAB — HEMOGLOBIN A1C
Hgb A1c MFr Bld: 8.1 % — ABNORMAL HIGH (ref 4.8–5.6)
Mean Plasma Glucose: 185.77 mg/dL

## 2023-11-02 LAB — BLOOD GAS, VENOUS
Acid-base deficit: 0.4 mmol/L (ref 0.0–2.0)
Bicarbonate: 25.4 mmol/L (ref 20.0–28.0)
O2 Saturation: 60.5 %
Patient temperature: 36.7
pCO2, Ven: 44 mm[Hg] (ref 44–60)
pH, Ven: 7.36 (ref 7.25–7.43)
pO2, Ven: 34 mm[Hg] (ref 32–45)

## 2023-11-02 LAB — URINALYSIS, COMPLETE (UACMP) WITH MICROSCOPIC
Bacteria, UA: NONE SEEN
Bilirubin Urine: NEGATIVE
Glucose, UA: >1000 mg/dL — AB
Hgb urine dipstick: NEGATIVE
Ketones, ur: NEGATIVE mg/dL
Leukocytes,Ua: NEGATIVE
Nitrite: NEGATIVE
Protein, ur: NEGATIVE mg/dL
Specific Gravity, Urine: 1.036 — ABNORMAL HIGH (ref 1.005–1.030)
pH: 5.5 (ref 5.0–8.0)

## 2023-11-02 LAB — TSH: TSH: 0.861 u[IU]/mL (ref 0.350–4.500)

## 2023-11-02 LAB — VITAMIN B12: Vitamin B-12: 639 pg/mL (ref 180–914)

## 2023-11-02 LAB — GLUCOSE, CAPILLARY
Glucose-Capillary: 165 mg/dL — ABNORMAL HIGH (ref 70–99)
Glucose-Capillary: 145 mg/dL — ABNORMAL HIGH (ref 70–99)

## 2023-11-02 LAB — URIC ACID: Uric Acid, Serum: 6.6 mg/dL (ref 3.7–8.6)

## 2023-11-02 LAB — HIV ANTIBODY (ROUTINE TESTING W REFLEX): HIV Screen 4th Generation wRfx: NONREACTIVE

## 2023-11-02 LAB — AMMONIA: Ammonia: 37 umol/L — ABNORMAL HIGH (ref 9–35)

## 2023-11-02 LAB — PHOSPHORUS: Phosphorus: 4.4 mg/dL (ref 2.5–4.6)

## 2023-11-02 MED ORDER — LACTATED RINGERS IV SOLN: INTRAVENOUS | Status: DC

## 2023-11-02 MED ORDER — INSULIN GLARGINE-YFGN 100 UNIT/ML ~~LOC~~ SOLN
20.0000 [IU] | Freq: Every day | SUBCUTANEOUS | Status: DC
  Administered 2023-11-02 – 2023-11-03 (×2): 20 [IU] via SUBCUTANEOUS
  Filled 2023-11-02 (×2): qty 0.2

## 2023-11-02 MED ORDER — SODIUM CHLORIDE 0.9 % IV SOLN: INTRAVENOUS | Status: DC

## 2023-11-02 MED ORDER — ENOXAPARIN SODIUM 40 MG/0.4ML IJ SOSY
40.0000 mg | PREFILLED_SYRINGE | INTRAMUSCULAR | Status: DC
  Administered 2023-11-02: 40 mg via SUBCUTANEOUS
  Filled 2023-11-02: qty 0.4

## 2023-11-02 MED ORDER — INSULIN ASPART 100 UNIT/ML IJ SOLN: 0.0000 [IU] | Freq: Every day | INTRAMUSCULAR | Status: DC

## 2023-11-02 MED ORDER — ACETAMINOPHEN 325 MG PO TABS: 650.0000 mg | ORAL_TABLET | Freq: Four times a day (QID) | ORAL | Status: DC | PRN

## 2023-11-02 MED ORDER — ONDANSETRON HCL 4 MG/2ML IJ SOLN: 4.0000 mg | Freq: Four times a day (QID) | INTRAMUSCULAR | Status: DC | PRN

## 2023-11-02 MED ORDER — ONDANSETRON HCL 4 MG PO TABS: 4.0000 mg | ORAL_TABLET | Freq: Four times a day (QID) | ORAL | Status: DC | PRN

## 2023-11-02 MED ORDER — INSULIN ASPART 100 UNIT/ML IJ SOLN
0.0000 [IU] | Freq: Three times a day (TID) | INTRAMUSCULAR | Status: DC
  Administered 2023-11-03: 3 [IU] via SUBCUTANEOUS

## 2023-11-02 MED ORDER — ACETAMINOPHEN 650 MG RE SUPP: 650.0000 mg | Freq: Four times a day (QID) | RECTAL | Status: DC | PRN

## 2023-11-02 NOTE — H&P (Signed)
History and Physical    NOMAR DEVENNY ZOX:[id number] DOB: Mar 15, 1951 DOA: 11/02/2023  PCP: Renford Dills, MD Patient coming from: Home  Chief Complaint: Sleepiness  HPI: Weston Anna with recent diagnosis of B-cell lymphoma recently started on chemotherapy, prior colon cancer, IDDM-2, HTN, chronic low back pain and obesity admitted from oncology office due to altered mental status and lethargy.  Patient was recently started on chemotherapy for B-cell lymphoma.  Patient reports difficulty sleeping at night since he started receiving Benadryl and Decadron with chemotherapy.  He attributes to getting Benadryl with chemoinfusion that makes him fall asleep during the day and then Decadron preventing him from falling asleep at night.  He denies new medication.  He denies fever, chills, runny nose, sore throat, cough, shortness of breath, chest pain, nausea, vomiting, abdominal pain, diarrhea or UTI symptoms.  He denies prior diagnosis of sleep apnea but admits to snoring that he has to sleep in a different bedroom.  He denies smoking cigarette.  Admits to drinking alcohol socially.  Denies recreational drug use.  Likes to be full code.  Ortho oncology office, vital stable.  CMP and CBC unrevealing except for hyperglycemia to 229 mild leukopenia.  CXR without acute finding.  Ammonia was 37.  UA with glucosuria > 1000.  Single blood culture ordered.  Admission requested for altered mental status and lethargy.   ROS All review of system negative except for pertinent positives and negatives as history of present illness above.  PMH Past Medical History:  Diagnosis Date   Arthritis    Chemotherapy induced neutropenia (HCC)    Ectopic kidney    GERD (gastroesophageal reflux disease)    at times   Hypertension    Lymphadenopathy, axillary    Malignant neoplasm of sigmoid colon (HCC) 11/2016   oncologist--- dr sherrill/  surgeon-- dr a. Maisie Fus;  dx 01/ 2018;  Stage IIIa;  02-02-2017  s/p  partial colectomy w/ umbilical hernia repair (1 out of 26 nodes +);   completed chemo 06/ 2018   Nocturia    Pulmonary nodules    noted CT 02/ 2018 , followed by oncology,  LUL & LLL, stable   Type 2 diabetes mellitus treated with insulin (HCC)    followed by pcp;   (09-05-2023  per pt checks blood sugar in am fasting , average 113-120)   Wears glasses    PSH Past Surgical History:  Procedure Laterality Date   APPENDECTOMY  1978   AXILLARY LYMPH NODE BIOPSY Right 09/08/2023   Procedure: RIGHT AXILLARY LYMPH NODE EXCISIONAL BIOPSY;  Surgeon: Romie Levee, MD;  Location: [facility];  Service: General;  Laterality: Right;   COLONOSCOPY WITH PROPOFOL N/A 12/19/2016   Procedure: COLONOSCOPY WITH PROPOFOL;  Surgeon: Charolett Bumpers, MD;  Location: [facility];  Service: Endoscopy;  Laterality: N/A;   COLONOSCOPY WITH PROPOFOL  05/29/2019   dr Ewing Schlein   FLEXIBLE SIGMOIDOSCOPY N/A 12/20/2016   Procedure: FLEXIBLE SIGMOIDOSCOPY;  Surgeon: Charolett Bumpers, MD;  Location: [facility];  Service: Endoscopy;  Laterality: N/A;   INCISIONAL HERNIA REPAIR N/A 10/06/2017   Procedure: LAPAROSCOPIC INCISIONAL HERNIA REPAIR WITH MESH;  Surgeon: Romie Levee, MD;  Location: [facility];  Service: General;  Laterality: N/A;   INSERTION OF MESH N/A 10/06/2017   Procedure: INSERTION OF MESH;  Surgeon: Romie Levee, MD;  Location: [facility];  Service: General;  Laterality: N/A;   LAPAROSCOPIC PARTIAL COLECTOMY N/A 02/02/2017   Procedure: LAPAROSCOPIC PARTIAL COLECTOMY;  Surgeon: Romie Levee, MD;  Location: [facility];  Service: General;  Laterality: N/A;   LYMPH NODE BIOPSY Left 09/22/2023   Procedure: LYMPH NODE BIOPSY LEFT AXILLA;  Surgeon: Romie Levee, MD;  Location: [facility];  Service: General;  Laterality: Left;  47   UMBILICAL HERNIA REPAIR N/A 02/02/2017   Procedure: HERNIA REPAIR UMBILICAL ADULT;  Surgeon: Romie Levee, MD;  Location: [facility];  Service: General;   Laterality: N/A;   Fam HX History reviewed. No pertinent family history.  Social Hx  reports that he has quit smoking. His smoking use included cigarettes. He has never used smokeless tobacco. He reports current alcohol use. He reports that he does not use drugs.  Allergy Allergies  Allergen Reactions   Quinine Derivatives Itching   Home Meds Prior to Admission medications   Medication Sig Start Date End Date Taking? Authorizing Provider  allopurinol (ZYLOPRIM) 100 MG tablet Take 1 tablet (100 mg total) by mouth daily. START ON 10/02/23 09/29/23   Ladene Artist, MD  aspirin EC 81 MG tablet Take 81 mg by mouth daily.    [provider]  atorvastatin (LIPITOR) 10 MG tablet Take 10 mg by mouth daily. 11/22/16   [provider]  budesonide-formoterol (SYMBICORT) 160-4.5 MCG/ACT inhaler Inhale 2 puffs into the lungs 2 (two) times daily as needed. Patient not taking: Reported on 09/28/2023    [provider]  Calcium Carb-Cholecalciferol (CALCIUM + D3 PO) Take 1 tablet by mouth daily.    [provider]  diclofenac Sodium (VOLTAREN) 1 % GEL Apply topically daily as needed.    [provider]  JARDIANCE 10 MG TABS tablet Take 10 mg by mouth daily. 03/22/22   [provider]  levothyroxine (SYNTHROID) 50 MCG tablet Take 50 mcg by mouth every morning.    [provider]  losartan (COZAAR) 50 MG tablet Take 50 mg by mouth daily.    [provider]  meloxicam (MOBIC) 15 MG tablet Take 15 mg by mouth daily as needed for pain. 10/04/18   [provider]  metFORMIN (GLUCOPHAGE) 1000 MG tablet Take 1,000 mg by mouth 2 (two) times daily with a meal.    [provider]  methocarbamol (ROBAXIN) 750 MG tablet Take 750 mg by mouth every 6 (six) hours as needed for muscle spasms. 10/10/20   [provider]  omeprazole (PRILOSEC) 20 MG capsule Take 20 mg by mouth daily. 11/22/16   [provider]   prochlorperazine (COMPAZINE) 10 MG tablet Take 1 tablet (10 mg total) by mouth every 6 (six) hours as needed. 09/29/23   Ladene Artist, MD  Semaglutide, 1 MG/DOSE, (OZEMPIC, 1 MG/DOSE,) 4 MG/3ML SOPN Inject 1 mg into the skin once a week. Monday's    [provider]  traMADol (ULTRAM) 50 MG tablet Take 1-2 tablets (50-100 mg total) by mouth every 6 (six) hours as needed for moderate pain (pain score 4-6). 09/22/23   Romie Levee, MD  TRESIBA FLEXTOUCH 100 UNIT/ML FlexTouch Pen Inject 30 Units into the skin daily. 01/16/22   [provider]  valACYclovir (VALTREX) 1000 MG tablet Take 1 tablet (1,000 mg total) by mouth 2 (two) times daily. X 7 days, then start 500 mg daily dosing 10/09/23   Ladene Artist, MD  valACYclovir (VALTREX) 500 MG tablet Take 1 tablet (500 mg total) by mouth daily. Start after 1000 mg bid x 7 day is completed 10/09/23   Ladene Artist, MD  venetoclax [facility]) 10 & 50 & 100  MG Starter Pack Take by mouth daily. Take 20 mg for 7 days, then 50 mg daily x 7d, then 100 mg daily x 7d, then 200 mg daily x 7d. Take with food & water. 10/25/23   Ladene Artist, MD  VENTOLIN HFA 108 (90 Base) MCG/ACT inhaler Inhale 1-2 puffs into the lungs every 6 (six) hours as needed (for wheezing/shortness of breath). Patient not taking: Reported on 10/31/2023 02/13/17   [provider]    Physical Exam: Vitals:   11/02/23 1640  Weight: 112.4 kg  Height: 5\' 10"  (1.778 m)    GENERAL: No acute distress.  Appears well.  HEENT: MMM.  Vision and hearing grossly intact.  NECK: Supple.  No apparent JVD.  RESP:  No IWOB. Good air movement bilaterally. CVS:  RRR. Heart sounds normal.  ABD/GI/GU: Bowel sounds present. Soft. Non tender.  MSK/EXT:  Moves extremities. No apparent deformity or edema.  SKIN: no apparent skin lesion or wound NEURO: Sleepy but wakes to voice.  Oriented x 4.  No gross deficit.  PSYCH: Calm. Normal affect.    Personally Reviewed  Radiological Exams See HPI   Personally Reviewed Labs: CBC: Recent Labs  Lab 10/31/23 0951 11/02/23 1005  WBC 3.3* 3.5*  NEUTROABS 1.5* 1.4*  HGB 14.8 13.4  HCT 46.8 42.6  MCV 74.2* 74.0*  PLT 158 154   Basic Metabolic Panel: Recent Labs  Lab 10/31/23 0951 11/02/23 1005  NA 138 141  K 4.4 4.0  CL 104 108  CO2 24 23  GLUCOSE 257* 229*  BUN 19 23  CREATININE 1.15 1.08  CALCIUM 9.4 9.3  PHOS 4.1 4.4   GFR: Estimated Creatinine Clearance: 79.9 mL/min (by C-G formula based on SCr of 1.08 mg/dL). Liver Function Tests: Recent Labs  Lab 10/31/23 0951 11/02/23 1005  AST 38 31  ALT 38 31  ALKPHOS 112 98  BILITOT 0.5 0.4  PROT 6.9 6.4*  ALBUMIN 4.4 3.9   No results for input(s): "LIPASE", "AMYLASE" in the last 168 hours. Recent Labs  Lab 11/02/23 1049  AMMONIA 37*   Coagulation Profile: No results for input(s): "INR", "PROTIME" in the last 168 hours. Cardiac Enzymes: No results for input(s): "CKTOTAL", "CKMB", "CKMBINDEX", "TROPONINI" in the last 168 hours. BNP (last 3 results) No results for input(s): "PROBNP" in the last 8760 hours. HbA1C: No results for input(s): "HGBA1C" in the last 72 hours. CBG: No results for input(s): "GLUCAP" in the last 168 hours. Lipid Profile: No results for input(s): "CHOL", "HDL", "LDLCALC", "TRIG", "CHOLHDL", "LDLDIRECT" in the last 72 hours. Thyroid Function Tests: No results for input(s): "TSH", "T4TOTAL", "FREET4", "T3FREE", "THYROIDAB" in the last 72 hours. Anemia Panel: No results for input(s): "VITAMINB12", "FOLATE", "FERRITIN", "TIBC", "IRON", "RETICCTPCT" in the last 72 hours. Urine analysis:    Component Value Date/Time   COLORURINE YELLOW 11/02/2023 1005   APPEARANCEUR CLEAR 11/02/2023 1005   LABSPEC 1.036 (H) 11/02/2023 1005   PHURINE 5.5 11/02/2023 1005   GLUCOSEU >1,000 (A) 11/02/2023 1005   HGBUR NEGATIVE 11/02/2023 1005   BILIRUBINUR NEGATIVE 11/02/2023 1005   KETONESUR NEGATIVE 11/02/2023 1005    PROTEINUR NEGATIVE 11/02/2023 1005   UROBILINOGEN 0.2 01/08/2011 1501   NITRITE NEGATIVE 11/02/2023 1005   LEUKOCYTESUR NEGATIVE 11/02/2023 1005     Assessment and plan: Increased somnolence/lethargy: This is likely due to undiagnosed sleep apnea and Benadryl that he received with chemotherapy.  No longer takes tramadol.  Low suspicion for infection.  Has no focal neurodeficit either.  Patient is sleepy  but wakes to voice.  He is oriented x 4. -Check basic encephalopathy vitals including B12, TSH, VBG, UDS -IV fluid -Follow-up blood culture obtained at oncology office -Needs outpatient sleep study -Minimize or avoid sedating medications -PT/OT eval  IDDM-2 with hyperglycemia: A1c 9.3% in 2018 -Check hemoglobin A1c -Semglee 20 units daily -SSI moderate with night coverage  B-cell lymphoma: Recently started on chemotherapy.  Followed by Dr. Truett Perna. -Defer to oncology  Hypothyroidism -Follow TSH -Resume home Synthroid after med rec  Essential hypertension -Resume home meds after med rec  At risk for sleep apnea: Reports snoring loud when he sleeps and has to use different bedroom -Needs formal sleep study outpatient  History of sigmoid colon cancer s/p partial colectomy in 2018  Morbid obesity: BMI 35.56 with comorbidity as above. -Encourage lifestyle change to lose weight  DVT prophylaxis: Subcu Lovenox  Code Status: Full code Family Communication: None at bedside  Consults called: Oncology added to treatment team Admission status: Observation   Almon Hercules MD [facility]  If 7PM-7AM, please contact night-coverage [email or web address]  11/02/2023, 7:03 PM

## 2023-11-02 NOTE — Plan of Care (Signed)
Plan of Care Note for accepted transfer   Patient: Angel [name] MRN: [id number]   DOA: (Not on file)  Facility requesting transfer: Oncology Requesting Provider: Dr. Truett Perna  Reason for transfer: AMS/lethargy Facility course:  Mr. Hensler is a 72 year old male with PMH B-cell lymphoma recently completed cycle 1 day 15 obinutuzumab 10/16/2023 and venetoclax on a ramp-up schedule 10/29/2023.  He has been followed closely by oncology outpatient. He was recently seen for follow-up on 10/31/2023 and noted to have extreme lethargy, unable to stay awake.  He was monitored in the office most of the day and regained strength/energy and was able to be discharged home with recommendations to avoid driving and to again follow-up for further improvement. He again presented to the cancer center on 11/02/2023 and was again noted to be extremely lethargic/somnolent. He underwent lab workup due to this altered mentation and significant somnolence. Ammonia very mildly elevated, 37 CBC unremarkable CMP also no concerning findings to explain somnolence Uric acid normal, 6.6.  He is recommended for admission for further workup regarding his altered mentation and significant lethargy. He should undergo ABG testing, UDS, alcohol level upon admission. Home med list will need to be reviewed including sedating medications.  Plan of care: The patient is accepted for admission to Med-surg  unit, at [facility].  Author: Lewie Chamber, MD 11/02/2023  Check [email or web address] for on-call coverage.  Nursing staff, Please call [facility] Admits & Consults System-Wide number on Amion as soon as patient's arrival, so appropriate admitting provider can evaluate the pt.

## 2023-11-02 NOTE — Progress Notes (Signed)
V.O received to infuse Normal Saline 0.9 % 100 ml/hr.

## 2023-11-02 NOTE — Progress Notes (Signed)
[facility] OFFICE PROGRESS NOTE   Diagnosis: Small lymphocytic lymphoma  INTERVAL HISTORY:   Angel [name] returns as scheduled.  He continues to be lethargic.  No fever.  No nausea or vomiting.  No diarrhea.  He complains of constipation.  He denies falls, but notes that he is unsteady.  Objective:  Vital signs in last 24 hours:  Blood pressure (!) 98/56, pulse (!) 109, temperature 97.7 F (36.5 C), temperature source Temporal, height 5\' 10"  (1.778 m), weight 247 lb 6.4 oz (112.2 kg), SpO2 95%.     Lymphatics: Bilateral cervical/scalene nodes continue to be smaller. Resp: Lungs clear, poor effort. Cardio: Regular rate and rhythm. GI: No hepatosplenomegaly. Vascular: No leg edema. Neuro: Lethargic, oriented.  Follows commands.  Motor strength 5/5.  Pupils are small, reactive.   Lab Results:  Lab Results  Component Value Date   WBC 3.3 (L) 10/31/2023   HGB 14.8 10/31/2023   HCT 46.8 10/31/2023   MCV 74.2 (L) 10/31/2023   PLT 158 10/31/2023   NEUTROABS 1.5 (L) 10/31/2023    Imaging:  DG Chest 2 View  Result Date: 10/31/2023 CLINICAL DATA:  Lymphoma, cough and dyspnea EXAM: CHEST - 2 VIEW COMPARISON:  11/30/2022 chest x-ray, CT 08/24/2023 FINDINGS: The heart size and mediastinal contours are within normal limits. Both lungs are clear. The visualized skeletal structures are unremarkable. IMPRESSION: No active cardiopulmonary disease. Electronically Signed   By: Jasmine Pang M.D.   On: 10/31/2023 15:05    Medications: I have reviewed the patient's current medications.  Assessment/Plan: Colon cancer, sigmoid, stage IIIa (T1,N1), status post a partial colectomy 02/02/2017 1/26 lymph nodes positive MSI-stable, no loss of mismatch repair protein expression Staging CTs 12/29/2016-small indeterminate left lung nodules Normal preoperative CEA Cycle 1 CAPOX 03/03/2017 Cycle 2 CAPOX 03/24/2017 Cycle 3 CAPOX 04/14/2017 Cycle 4 CAPOX 05/05/2017 CTs  11/06/2018- negative for recurrent disease, small left lung nodule stable from 2007-consider benign Colonoscopy 05/29/2019-negative CTs 10/25/2019-mild increase in a 1 cm small bowel mesenteric lymph node, no evidence of recurrent disease CTs 10/20/2020-several small left-sided lung nodule stable when compared to prior CTs.  No evidence of metastatic disease in the abdomen or pelvis.  Diffuse fatty infiltration of the liver.     2.  Indeterminate left lung nodules on a chest CT 12/29/2016, stable lung nodules on CT 10/25/2019   3.   Chronic red cell microcytosis   4.   Diabetes   5.   Hypertension   6.   Family history of colon cancer   7.   Multiple polyps noted on the colonoscopy 12/19/2016   8.   Neutropenia secondary to chemotherapy   9.   Extensive peripheral adenopathy on exam 08/21/2023-CTs neck through pelvis 08/24/2023 with extensive lymphadenopathy neck, chest, abdomen and pelvis.  Scattered tiny lung nodule stable from prior examination and compatible with a benign finding. 09/08/2023-right axillary lymph node biopsy-necrotic lymph node 09/22/2023-left axillary lymph node biopsy-75% of lymphocytes with CD5/CD200 positive lambda restricted on flow cytometry, surgical otology with a CD5 positive B-cell lymphoma, CD20, PAX5, cyclin D1 negative, perforation right 5-10%, CLL/SLL favored Cycle 1 day 1 obinutuzumab 10/03/2023, day 2 10/04/2023, day 8 10/09/2023, day 15 10/16/2023 Venetoclax 10/29/2023, ramp-up schedule Obinutuzumab and venetoclax placed on hold 10/31/2023   10.  Positive hepatitis B core antibody and negative surface antigen 09/28/2023-repeat labs in 4 weeks. 11.  Herpetic lip lesions 10/09/2023-Valtrex  Disposition: Angel [name] has CLL currently being treated with obinutuzumab and venetoclax.  Both were placed on  hold 2 days ago when he presented with lethargy of unclear etiology.  He improved with IV hydration.  He presents today for scheduled follow-up.  He has  persistent lethargy/altered mental status.  Laboratory evaluation is unrevealing.  We are obtaining urine and blood cultures.  We are making arrangements for hospital admission.  Patient seen with Dr. Truett Perna.  Lonna Cobb ANP/GNP-BC   11/02/2023  9:31 AM   This was a shared visit with Lonna Cobb.  Angel [name] was interviewed and examined.  He continues to have altered mental status, similar to when he was here on 10/31/2023.  The etiology of the somnolence is unclear.  No focal findings on neurologic exam.  The differential diagnosis includes a CVA, toxicity from obinutuzumab, or an unrecognized metabolic process.  There is no evidence of an infection.  I contacted Dr.Girguis.  He agreed to accept Mr. Warren for admission to the medical service.  I will follow him in the hospital.  I was present for greater than 50% of today's visit.  I performed medical decision making.  Mancel Bale, MD

## 2023-11-03 ENCOUNTER — Other Ambulatory Visit: Payer: Self-pay

## 2023-11-03 ENCOUNTER — Encounter: Payer: Self-pay | Admitting: Oncology

## 2023-11-03 DIAGNOSIS — R5383 Other fatigue: Secondary | ICD-10-CM | POA: Diagnosis not present

## 2023-11-03 DIAGNOSIS — E1165 Type 2 diabetes mellitus with hyperglycemia: Secondary | ICD-10-CM | POA: Diagnosis not present

## 2023-11-03 DIAGNOSIS — G4700 Insomnia, unspecified: Secondary | ICD-10-CM | POA: Diagnosis not present

## 2023-11-03 DIAGNOSIS — C9110 Chronic lymphocytic leukemia of B-cell type not having achieved remission: Secondary | ICD-10-CM | POA: Diagnosis not present

## 2023-11-03 DIAGNOSIS — Z85038 Personal history of other malignant neoplasm of large intestine: Secondary | ICD-10-CM | POA: Diagnosis not present

## 2023-11-03 DIAGNOSIS — Z7985 Long-term (current) use of injectable non-insulin antidiabetic drugs: Secondary | ICD-10-CM | POA: Diagnosis not present

## 2023-11-03 DIAGNOSIS — R2689 Other abnormalities of gait and mobility: Secondary | ICD-10-CM | POA: Diagnosis not present

## 2023-11-03 DIAGNOSIS — Z7982 Long term (current) use of aspirin: Secondary | ICD-10-CM | POA: Diagnosis not present

## 2023-11-03 DIAGNOSIS — Z87891 Personal history of nicotine dependence: Secondary | ICD-10-CM | POA: Diagnosis not present

## 2023-11-03 DIAGNOSIS — Z79899 Other long term (current) drug therapy: Secondary | ICD-10-CM | POA: Diagnosis not present

## 2023-11-03 DIAGNOSIS — I10 Essential (primary) hypertension: Secondary | ICD-10-CM | POA: Diagnosis not present

## 2023-11-03 DIAGNOSIS — C8510 Unspecified B-cell lymphoma, unspecified site: Secondary | ICD-10-CM | POA: Diagnosis not present

## 2023-11-03 DIAGNOSIS — Z7984 Long term (current) use of oral hypoglycemic drugs: Secondary | ICD-10-CM | POA: Diagnosis not present

## 2023-11-03 LAB — CBC
HCT: 46.1 % (ref 39.0–52.0)
Hemoglobin: 14.1 g/dL (ref 13.0–17.0)
MCH: 23.3 pg — ABNORMAL LOW (ref 26.0–34.0)
MCHC: 30.6 g/dL (ref 30.0–36.0)
MCV: 76.2 fL — ABNORMAL LOW (ref 80.0–100.0)
Platelets: 165 10*3/uL (ref 150–400)
RBC: 6.05 MIL/uL — ABNORMAL HIGH (ref 4.22–5.81)
RDW: 17.8 % — ABNORMAL HIGH (ref 11.5–15.5)
WBC: 3.4 10*3/uL — ABNORMAL LOW (ref 4.0–10.5)
nRBC: 0 % (ref 0.0–0.2)

## 2023-11-03 LAB — DIFFERENTIAL
Abs Immature Granulocytes: 0.03 10*3/uL (ref 0.00–0.07)
Basophils Absolute: 0 10*3/uL (ref 0.0–0.1)
Basophils Relative: 1 %
Eosinophils Absolute: 0.3 10*3/uL (ref 0.0–0.5)
Eosinophils Relative: 8 %
Immature Granulocytes: 1 %
Lymphocytes Relative: 32 %
Lymphs Abs: 1.1 10*3/uL (ref 0.7–4.0)
Monocytes Absolute: 0.5 10*3/uL (ref 0.1–1.0)
Monocytes Relative: 16 %
Neutro Abs: 1.4 10*3/uL — ABNORMAL LOW (ref 1.7–7.7)
Neutrophils Relative %: 42 %

## 2023-11-03 LAB — GLUCOSE, CAPILLARY: Glucose-Capillary: 167 mg/dL — ABNORMAL HIGH (ref 70–99)

## 2023-11-03 LAB — BASIC METABOLIC PANEL
Anion gap: 7 (ref 5–15)
BUN: 15 mg/dL (ref 8–23)
CO2: 23 mmol/L (ref 22–32)
Calcium: 8.8 mg/dL — ABNORMAL LOW (ref 8.9–10.3)
Chloride: 107 mmol/L (ref 98–111)
Creatinine, Ser: 0.98 mg/dL (ref 0.61–1.24)
GFR, Estimated: >60 mL/min (ref >60)
Glucose, Bld: 173 mg/dL — ABNORMAL HIGH (ref 70–99)
Potassium: 3.9 mmol/L (ref 3.5–5.1)
Sodium: 137 mmol/L (ref 135–145)

## 2023-11-03 MED ORDER — ALLOPURINOL 100 MG PO TABS
100.0000 mg | ORAL_TABLET | Freq: Every day | ORAL | Status: DC
  Administered 2023-11-03: 100 mg via ORAL
  Filled 2023-11-03: qty 1

## 2023-11-03 NOTE — TOC Transition Note (Signed)
Transition of Care [facility]) - CM/SW Discharge Note   Patient Details  Name: Angel [name] MRN: [id number] Date of Birth: 02/11/1951  [facility] CM/SW Contact:  Lanier Clam, RN Phone Number: 11/03/2023, 11:29 AM   Clinical Narrative:  d/c home no needs or orders.     Final next level of care: Home/Self Care Barriers to Discharge: No Barriers Identified   Patient Goals and CMS Choice      Discharge Placement                         Discharge Plan and Services Additional resources added to the After Visit Summary for                                       Social Determinants of Health (SDOH) Interventions SDOH Screenings   Food Insecurity: No Food Insecurity (11/02/2023)  Housing: Low Risk  (11/02/2023)  Transportation Needs: No Transportation Needs (11/02/2023)  Utilities: Not At Risk (11/02/2023)  Financial Resource Strain: Low Risk  (10/03/2023)  Tobacco Use: Medium Risk (11/02/2023)  Health Literacy: Adequate Health Literacy (10/03/2023)     Readmission Risk Interventions     No data to display

## 2023-11-03 NOTE — Discharge Summary (Signed)
Physician Discharge Summary   Angel [name] GNF:[id number] DOB: 01/10/1951 DOA: 11/02/2023  PCP: Renford Dills, MD  Admit date: 11/02/2023 Discharge date: 11/03/2023   Admitted From: Home Disposition:  Home Discharging physician: Lewie Chamber, MD Barriers to discharge: none  Recommendations at discharge: Continue care with oncology for lymphoma Encourage better sleepy hygiene  Consider WatchPAT or formal sleep study    Discharge Condition: stable CODE STATUS: Full Diet recommendation:  Diet Orders (From admission, onward)     Start     Ordered   11/03/23 0000  Diet Carb Modified        11/03/23 1120   11/02/23 1912  Diet heart healthy/carb modified Room service appropriate? Yes; Fluid consistency: Thin  Diet effective now       Question Answer Comment  Diet-HS Snack? Nothing   Room service appropriate? Yes   Fluid consistency: Thin      11/02/23 1911            Hospital Course: Mr. Vanderkooi is a 72 yo male with PMH B-cell lymphoma recently started on chemo.  He has been receiving Benadryl and Decadron with chemotherapy.  He has been having extreme difficulty regulating his sleep cycle at home and has been found to be going to the park to walk in the middle of the night due to insomnia followed by sleeping during the day. He was recently seen on 10/31/2023 and 11/02/2023 at oncology office.  He was found to be excessively lethargic.  Ultimately he was referred to the hospital for further workup given persistence of problem. Lab workup was overall unremarkable.  Chemistries and hematology studies did not reveal any etiology for the lethargy. VBG was negative for hypercarbia, TSH normal, B12 normal. Uric acid also normal range. He had no focal neurodeficits on exam.  With ongoing collateral information, due to his significantly altered sleep cycle, it was determined his extreme daytime lethargy was due to being awake all night followed by morning doctor visit  appointments when he was excessively sleepy and difficult to arouse.  Given history of snoring, he was recommended to discuss sleep study with primary care at follow-up.  As workup was negative and reassuring, he was discharged home with strong recommendation to try and resume normal sleep cycle.  He has been taking NyQuil at night with minimal effect.  He was recommended to try plain Benadryl prior to bed if still unable to sleep.  As neuro exam remained normal on repetition, there felt to be no need for further imaging workup and stroke was adequately ruled out.   The patient's acute and chronic medical conditions were treated accordingly. On day of discharge, patient was felt deemed stable for discharge. Patient/family member advised to call PCP or come back to ER if needed.   Principal Diagnosis: Lethargy  Discharge Diagnoses: Active Hospital Problems   Diagnosis Date Noted   Insomnia 11/03/2023    Priority: 2.   Small lymphocytic lymphoma (HCC) 09/28/2023    Resolved Hospital Problems   Diagnosis Date Noted Date Resolved   Lethargy 11/02/2023 11/03/2023    Priority: 1.     Discharge Instructions     Diet Carb Modified   Complete by: As directed    Increase activity slowly   Complete by: As directed       Allergies as of 11/03/2023       Reactions   Quinine Derivatives Itching        Medication List     STOP taking these  medications    traMADol 50 MG tablet Commonly known as: ULTRAM       TAKE these medications    allopurinol 100 MG tablet Commonly known as: Zyloprim Take 1 tablet (100 mg total) by mouth daily. START ON 10/02/23   aspirin EC 81 MG tablet Take 81 mg by mouth daily.   atorvastatin 10 MG tablet Commonly known as: LIPITOR Take 10 mg by mouth daily.   CALCIUM + D3 PO Take 1 tablet by mouth daily.   DAYQUIL PO Take 1 Dose by mouth daily. Using daily for cough   diclofenac Sodium 1 % Gel Commonly known as: VOLTAREN Apply topically  daily as needed.   empagliflozin 25 MG Tabs tablet Commonly known as: JARDIANCE Take 25 mg by mouth daily. What changed: Another medication with the same name was removed. Continue taking this medication, and follow the directions you see here.   levothyroxine 50 MCG tablet Commonly known as: SYNTHROID Take 50 mcg by mouth every morning.   losartan 50 MG tablet Commonly known as: COZAAR Take 50 mg by mouth daily.   meloxicam 15 MG tablet Commonly known as: MOBIC Take 15 mg by mouth daily.   metFORMIN 1000 MG tablet Commonly known as: GLUCOPHAGE Take 1,000 mg by mouth 2 (two) times daily with a meal.   methocarbamol 750 MG tablet Commonly known as: ROBAXIN Take 750 mg by mouth as needed for muscle spasms.   NYQUIL PO Take 1 Dose by mouth at bedtime. Taking nightly for cough   omeprazole 20 MG capsule Commonly known as: PRILOSEC Take 20 mg by mouth daily.   Ozempic (1 MG/DOSE) 4 MG/3ML Sopn Generic drug: Semaglutide (1 MG/DOSE) Inject 1 mg into the skin once a week.   prochlorperazine 10 MG tablet Commonly known as: COMPAZINE Take 1 tablet (10 mg total) by mouth every 6 (six) hours as needed.   Symbicort 160-4.5 MCG/ACT inhaler Generic drug: budesonide-formoterol Inhale 1 puff into the lungs 2 (two) times daily as needed (wheezing/SOB).   Evaristo Bury FlexTouch 100 UNIT/ML FlexTouch Pen Generic drug: insulin degludec Inject 30 Units into the skin daily.   valACYclovir 500 MG tablet Commonly known as: VALTREX Take 1 tablet (500 mg total) by mouth daily. Start after 1000 mg bid x 7 day is completed What changed: Another medication with the same name was removed. Continue taking this medication, and follow the directions you see here.   Venclexta Starting Pack 10 & 50 & 100 MG Starter Pack Generic drug: venetoclax Take by mouth daily. Take 20 mg for 7 days, then 50 mg daily x 7d, then 100 mg daily x 7d, then 200 mg daily x 7d. Take with food & water.   Ventolin HFA  108 (90 Base) MCG/ACT inhaler Generic drug: albuterol Inhale 1 puff into the lungs every 6 (six) hours as needed (for wheezing/shortness of breath).        Allergies  Allergen Reactions   Quinine Derivatives Itching    Consultations:   Procedures:   Discharge Exam: BP (!) 145/83 (BP Location: Right Arm)   Pulse 83   Temp 98.1 F (36.7 C) (Oral)   Resp 16   Ht 5\' 10"  (1.778 m)   Wt 112.4 kg   SpO2 95%   BMI 35.56 kg/m  Physical Exam Constitutional:      General: He is not in acute distress.    Appearance: Normal appearance.  HENT:     Head: Normocephalic and atraumatic.     Mouth/Throat:  Mouth: Mucous membranes are moist.  Eyes:     Extraocular Movements: Extraocular movements intact.  Cardiovascular:     Rate and Rhythm: Normal rate and regular rhythm.  Pulmonary:     Effort: Pulmonary effort is normal. No respiratory distress.     Breath sounds: Normal breath sounds. No wheezing.  Abdominal:     General: Bowel sounds are normal. There is no distension.     Palpations: Abdomen is soft.     Tenderness: There is no abdominal tenderness.  Musculoskeletal:        General: Normal range of motion.     Cervical back: Normal range of motion and neck supple.  Skin:    General: Skin is warm and dry.  Neurological:     General: No focal deficit present.     Mental Status: He is alert.  Psychiatric:        Mood and Affect: Mood normal.        Behavior: Behavior normal.      The results of significant diagnostics from this hospitalization (including imaging, microbiology, ancillary and laboratory) are listed below for reference.   Microbiology: Recent Results (from the past 240 hour(s))  Culture, Blood     Status: None (Preliminary result)   Collection Time: 11/02/23 10:52 AM   Specimen: BLOOD  Result Value Ref Range Status   Specimen Description   Final    BLOOD BLOOD LEFT HAND Performed at [facility], [address]  [place], [place] [address]    Special Requests   Final    BOTTLES DRAWN AEROBIC AND ANAEROBIC Blood Culture adequate volume Performed at [facility], [address] [place], [place] [address]    Culture   Final    NO GROWTH < 24 HOURS Performed at [facility], [address], [place], [place] [address]    Report Status PENDING  Incomplete     Labs: BNP (last 3 results) No results for input(s): "BNP" in the last 8760 hours. Basic Metabolic Panel: Recent Labs  Lab 10/31/23 0951 11/02/23 1005 11/03/23 0608  NA 138 141 137  K 4.4 4.0 3.9  CL 104 108 107  CO2 24 23 23   GLUCOSE 257* 229* 173*  BUN 19 23 15   CREATININE 1.15 1.08 0.98  CALCIUM 9.4 9.3 8.8*  PHOS 4.1 4.4  --    Liver Function Tests: Recent Labs  Lab 10/31/23 0951 11/02/23 1005  AST 38 31  ALT 38 31  ALKPHOS 112 98  BILITOT 0.5 0.4  PROT 6.9 6.4*  ALBUMIN 4.4 3.9   No results for input(s): "LIPASE", "AMYLASE" in the last 168 hours. Recent Labs  Lab 11/02/23 1049  AMMONIA 37*   CBC: Recent Labs  Lab 10/31/23 0951 11/02/23 1005 11/03/23 0608  WBC 3.3* 3.5* 3.4*  NEUTROABS 1.5* 1.4* 1.4*  HGB 14.8 13.4 14.1  HCT 46.8 42.6 46.1  MCV 74.2* 74.0* 76.2*  PLT 158 154 165   Cardiac Enzymes: No results for input(s): "CKTOTAL", "CKMB", "CKMBINDEX", "TROPONINI" in the last 168 hours. BNP: Invalid input(s): "POCBNP" CBG: Recent Labs  Lab 11/02/23 2025 11/02/23 2128 11/03/23 0721  GLUCAP 165* 145* 167*   D-Dimer No results for input(s): "DDIMER" in the last 72 hours. Hgb A1c Recent Labs    11/02/23 1901  HGBA1C 8.1*   Lipid Profile No results for input(s): "CHOL", "HDL", "LDLCALC", "TRIG", "CHOLHDL", "LDLDIRECT" in the last 72 hours. Thyroid function studies Recent Labs    11/02/23 1901  TSH 0.861   Anemia work up Recent Labs    11/02/23 1901  VITAMINB12 639   Urinalysis    Component Value Date/Time   COLORURINE YELLOW 11/02/2023 1005   APPEARANCEUR  CLEAR 11/02/2023 1005   LABSPEC 1.036 (H) 11/02/2023 1005   PHURINE 5.5 11/02/2023 1005   GLUCOSEU >1,000 (A) 11/02/2023 1005   HGBUR NEGATIVE 11/02/2023 1005   BILIRUBINUR NEGATIVE 11/02/2023 1005   KETONESUR NEGATIVE 11/02/2023 1005   PROTEINUR NEGATIVE 11/02/2023 1005   UROBILINOGEN 0.2 01/08/2011 1501   NITRITE NEGATIVE 11/02/2023 1005   LEUKOCYTESUR NEGATIVE 11/02/2023 1005   Sepsis Labs Recent Labs  Lab 10/31/23 0951 11/02/23 1005 11/03/23 0608  WBC 3.3* 3.5* 3.4*   Microbiology Recent Results (from the past 240 hour(s))  Culture, Blood     Status: None (Preliminary result)   Collection Time: 11/02/23 10:52 AM   Specimen: BLOOD  Result Value Ref Range Status   Specimen Description   Final    BLOOD BLOOD LEFT HAND Performed at [facility], [address] [place], [place] [address]    Special Requests   Final    BOTTLES DRAWN AEROBIC AND ANAEROBIC Blood Culture adequate volume Performed at [facility], [address] [place], [place] [address]    Culture   Final    NO GROWTH < 24 HOURS Performed at [facility], [address], [place], [place] [address]    Report Status PENDING  Incomplete    Procedures/Studies: DG Chest 2 View  Result Date: 10/31/2023 CLINICAL DATA:  Lymphoma, cough and dyspnea EXAM: CHEST - 2 VIEW COMPARISON:  11/30/2022 chest x-ray, CT 08/24/2023 FINDINGS: The heart size and mediastinal contours are within normal limits. Both lungs are clear. The visualized skeletal structures are unremarkable. IMPRESSION: No active cardiopulmonary disease. Electronically Signed   By: Jasmine Pang M.D.   On: 10/31/2023 15:05     Time coordinating discharge: Over 30 minutes    Lewie Chamber, MD  [facility] 11/03/2023, 2:07 PM

## 2023-11-03 NOTE — Progress Notes (Signed)
Patient is a&ox4, ambulatory with cane. No change from baseline. Discharge instructions were reviewed, pt denied questions or concerns at this time.

## 2023-11-03 NOTE — Plan of Care (Signed)

## 2023-11-03 NOTE — Hospital Course (Signed)
Mr. Tito is a 72 yo male with PMH B-cell lymphoma recently started on chemo.  He has been receiving Benadryl and Decadron with chemotherapy.  He has been having extreme difficulty regulating his sleep cycle at home and has been found to be going to the park to walk in the middle of the night due to insomnia followed by sleeping during the day. He was recently seen on 10/31/2023 and 11/02/2023 at oncology office.  He was found to be excessively lethargic.  Ultimately he was referred to the hospital for further workup given persistence of problem. Lab workup was overall unremarkable.  Chemistries and hematology studies did not reveal any etiology for the lethargy. VBG was negative for hypercarbia, TSH normal, B12 normal. Uric acid also normal range. He had no focal neurodeficits on exam.  With ongoing collateral information, due to his significantly altered sleep cycle, it was determined his extreme daytime lethargy was due to being awake all night followed by morning doctor visit appointments when he was excessively sleepy and difficult to arouse.  Given history of snoring, he was recommended to discuss sleep study with primary care at follow-up.  As workup was negative and reassuring, he was discharged home with strong recommendation to try and resume normal sleep cycle.  He has been taking NyQuil at night with minimal effect.  He was recommended to try plain Benadryl prior to bed if still unable to sleep.  As neuro exam remained normal on repetition, there felt to be no need for further imaging workup and stroke was adequately ruled out.

## 2023-11-03 NOTE — Progress Notes (Signed)
IP PROGRESS NOTE  Subjective:   Angel [name] was admitted yesterday with profound somnolence.  He was noted to have somnolence and altered mental status when we saw him on 10/31/2023 and again yesterday.  He relates the somnolence to not sleeping at night.  He reports not sleeping again last night.  He has chronic exertional dyspnea.  No other complaint.  He reports taking venetoclax through 10/31/2023.  Objective: Vital signs in last 24 hours: Blood pressure (!) 145/83, pulse 83, temperature 98.1 F (36.7 C), temperature source Oral, resp. rate 16, height 5\' 10"  (1.778 m), weight 247 lb 12.8 oz (112.4 kg), SpO2 95%.  Intake/Output from previous day: 12/05 0701 - 12/06 0700 In: 966.9 [I.V.:966.9] Out: 400 [Urine:400]  Physical Exam:  HEENT: No thrush or ulcers Lungs: End inspiratory rhonchi at the lower posterior chest bilaterally, no respiratory distress Cardiac: Regular rate and rhythm Abdomen: No hepatosplenomegaly Extremities: No leg edema Lymph nodes: Bilateral cervical and axillary nodes.  The largest nodes are in the left axilla. Neurologic: Alert and oriented, the motor exam appears intact in the upper and lower extremities bilaterally.   Lab Results: Recent Labs    11/02/23 1005 11/03/23 0608  WBC 3.5* 3.4*  HGB 13.4 14.1  HCT 42.6 46.1  PLT 154 165    BMET Recent Labs    11/02/23 1005 11/03/23 0608  NA 141 137  K 4.0 3.9  CL 108 107  CO2 23 23  GLUCOSE 229* 173*  BUN 23 15  CREATININE 1.08 0.98  CALCIUM 9.3 8.8*    Lab Results  Component Value Date   CEA1 1.52 10/19/2020   CEA 2.09 08/21/2023    Medications: I have reviewed the patient's current medications.  Assessment/Plan:    LOS: 1 day   Colon cancer, sigmoid, stage IIIa (T1,N1), status post a partial colectomy 02/02/2017 1/26 lymph nodes positive MSI-stable, no loss of mismatch repair protein expression Staging CTs 12/29/2016-small indeterminate left lung nodules Normal preoperative  CEA Cycle 1 CAPOX 03/03/2017 Cycle 2 CAPOX 03/24/2017 Cycle 3 CAPOX 04/14/2017 Cycle 4 CAPOX 05/05/2017 CTs 11/06/2018- negative for recurrent disease, small left lung nodule stable from 2007-consider benign Colonoscopy 05/29/2019-negative CTs 10/25/2019-mild increase in a 1 cm small bowel mesenteric lymph node, no evidence of recurrent disease CTs 10/20/2020-several small left-sided lung nodule stable when compared to prior CTs.  No evidence of metastatic disease in the abdomen or pelvis.  Diffuse fatty infiltration of the liver.     2.  Indeterminate left lung nodules on a chest CT 12/29/2016, stable lung nodules on CT 10/25/2019   3.   Chronic red cell microcytosis   4.   Diabetes   5.   Hypertension   6.   Family history of colon cancer   7.   Multiple polyps noted on the colonoscopy 12/19/2016   8.   Neutropenia secondary to chemotherapy   9.   Extensive peripheral adenopathy on exam 08/21/2023-CTs neck through pelvis 08/24/2023 with extensive lymphadenopathy neck, chest, abdomen and pelvis.  Scattered tiny lung nodule stable from prior examination and compatible with a benign finding. 09/08/2023-right axillary lymph node biopsy-necrotic lymph node 09/22/2023-left axillary lymph node biopsy-75% of lymphocytes with CD5/CD200 positive lambda restricted on flow cytometry, surgical otology with a CD5 positive B-cell lymphoma, CD20, PAX5, cyclin D1 negative, perforation right 5-10%, CLL/SLL favored Cycle 1 day 1 obinutuzumab 10/03/2023, day 2 10/04/2023, day 8 10/09/2023, day 15 10/16/2023 Venetoclax 10/29/2023, ramp-up schedule Obinutuzumab and venetoclax placed on hold 10/31/2023   10.  Positive hepatitis B core antibody and negative surface antigen 09/28/2023-repeat labs in 4 weeks. 11.  Herpetic lip lesions 10/09/2023-Valtrex  12.  Admission 11/02/2023 with somnolence  Angel [name] has small lymphocytic lymphoma.  He is being treated with obinutuzumab/venetoclax.  He presented earlier  this week with profound somnolence.  This was again present on 11/02/2023.  The etiology of the somnolence is unclear.  He did not receive sedating medications as part of the chemotherapy regimen.  There is no laboratory evidence of tumor lysis syndrome.  He relates his somnolence to not sleeping at night.  He is alert and oriented today.  He appears stable for discharge to home.  I recommend resuming venetoclax as prescribed.  He should continue allopurinol.    Outpatient follow-up will be scheduled at the Cancer center for 11/07/2023 for an office visit and obinutuzumab.  11/03/2023, 9:33 AM

## 2023-11-03 NOTE — Evaluation (Signed)
Occupational Therapy Evaluation/Discharge Patient Details Name: Angel [name] MRN: [id number] DOB: [birth year] Today's Date: 11/03/2023   History of Present Illness 72 y.o. male presents to therapy following hospital admission on 11/02/2023 due to somnolence and AMS. Pt presented to ED from OP oncology. Pt has recent dx of B-cell lymphoma and has started chemotherapy. Pt attributes lethargy to benadryl with chemoinfusion and decadron at night with pt reporting limited sleep at night time. Pt has PMH including but not limited to: colon cab s/p partial colectomy, DM II, HTN, chronic LBP, and HTN.   Clinical Impression   Patient evaluated by Occupational Therapy with no further acute OT needs identified. All education has been completed and the patient has no further questions. Pt is functioning at baseline of independent/Mod I for ADL tasks and functional mobility. No follow-up Occupational Therapy or equipment needs. OT is signing off. Thank you for this referral.           Functional Status Assessment  Patient has not had a recent decline in their functional status  Equipment Recommendations  None recommended by OT       Precautions / Restrictions Precautions Precautions: Fall Restrictions Weight Bearing Restrictions: No      Mobility Bed Mobility Overal bed mobility: Modified Independent             General bed mobility comments: pt seated EOB when PT arrived, HOB elevated.    Transfers Overall transfer level: Modified independent Equipment used: Straight cane       Balance Overall balance assessment: No apparent balance deficits (not formally assessed) (no fall hx)      ADL either performed or assessed with clinical judgement   ADL Overall ADL's : Independent;Modified independent;At baseline        Vision Baseline Vision/History: 1 Wears glasses (PRN) Ability to See in Adequate Light: 0 Adequate Patient Visual Report: No change from baseline Vision  Assessment?: No apparent visual deficits     Perception Perception: Within Functional Limits       Praxis Praxis: WFL       Pertinent Vitals/Pain Pain Assessment Pain Assessment: 0-10 Pain Score: 6  Pain Location: low back Pain Descriptors / Indicators: Constant, Aching, Dull, Discomfort Pain Intervention(s): Monitored during session     Extremity/Trunk Assessment Upper Extremity Assessment Upper Extremity Assessment: Overall WFL for tasks assessed;Right hand dominant   Lower Extremity Assessment Lower Extremity Assessment: Overall WFL for tasks assessed   Cervical / Trunk Assessment Cervical / Trunk Assessment: Normal   Communication Communication Communication: No apparent difficulties   Cognition Arousal: Alert Behavior During Therapy: WFL for tasks assessed/performed Overall Cognitive Status: Within Functional Limits for tasks assessed                                       General Comments  VSS on RA            Home Living Family/patient expects to be discharged to:: Private residence Living Arrangements: Other (Comment) (sister) Available Help at Discharge: Family Type of Home: House Home Access: Stairs to enter Secretary/administrator of Steps: 5-6 Entrance Stairs-Rails: Can reach both;Left;Right Home Layout: One level     Bathroom Shower/Tub: Tub/shower unit;Walk-in shower   Bathroom Toilet: Standard Bathroom Accessibility: Yes   Home Equipment: Cane - single point          Prior Functioning/Environment Prior Level of Function : Independent/Modified Independent;Driving  Mobility Comments: use of SPC for all functional mobility          OT Problem List: Decreased strength         OT Goals(Current goals can be found in the care plan section) Acute Rehab OT Goals Patient Stated Goal: to walk OT Goal Formulation: All assessment and education complete, DC therapy  OT Frequency:  1X visit     Co-evaluation PT/OT/SLP Co-Evaluation/Treatment: Yes Reason for Co-Treatment: To address functional/ADL transfers   OT goals addressed during session: ADL's and self-care;Proper use of Adaptive equipment and DME;Strengthening/ROM      AM-PAC OT "6 Clicks" Daily Activity     Outcome Measure Help from another person eating meals?: None Help from another person taking care of personal grooming?: None Help from another person toileting, which includes using toliet, bedpan, or urinal?: None Help from another person bathing (including washing, rinsing, drying)?: None Help from another person to put on and taking off regular upper body clothing?: None Help from another person to put on and taking off regular lower body clothing?: None 6 Click Score: 24   End of Session Equipment Utilized During Treatment: Gait belt;Other (comment) [facility]) Nurse Communication: Mobility status  Activity Tolerance: Patient tolerated treatment well Patient left: in chair;with call bell/phone within reach;with family/visitor present  OT Visit Diagnosis: Muscle weakness (generalized) (M62.81)                Time: 8657-8469 OT Time Calculation (min): 27 min Charges:  OT General Charges $OT Visit: 1 Visit OT Evaluation $OT Eval Low Complexity: 1 Low  Limmie Patricia, OTR/L,CBIS  Supplemental OT - [facility] and [facility] Secure Chat Preferred    Maki Sweetser, Charisse March 11/03/2023, 11:02 AM

## 2023-11-03 NOTE — Evaluation (Signed)
Physical Therapy Evaluation Only  Patient Details Name: Angel [name] MRN: [id number] DOB: 04-Dec-1950 Today's Date: 11/03/2023  History of Present Illness  72 yo male presents to therapy following hospital admission on 11/02/2023 due to somnolence and AMS. Pt presented to ED from OP oncology. Pt has recent dx of B-cell lymphoma and has started chemotherapy. Pt attributes lethargy to benadryl with chemoinfusion and decadron at night with pt reporting limited sleep at night time. Pt has PMH including but not limited to: colon cab s/p partial colectomy, DM II, HTN, chronic LBP, and HTN.  Clinical Impression   Patient evaluated by Physical Therapy with no further acute PT needs identified. All education has been completed and the patient has no further questions. See below for any follow-up Physical Therapy or DME needs. PT is signing off. Thank you for this referral.             If plan is discharge home, recommend the following: Help with stairs or ramp for entrance;Assist for transportation   Can travel by private vehicle        Equipment Recommendations None recommended by PT  Recommendations for Other Services       Functional Status Assessment Patient has not had a recent decline in their functional status     Precautions / Restrictions Precautions Precautions: Fall Restrictions Weight Bearing Restrictions: No      Mobility  Bed Mobility Overal bed mobility: Modified Independent             General bed mobility comments: pt seated EOB when PT arrived, HOB elevated.    Transfers Overall transfer level: Modified independent Equipment used: Straight cane                    Ambulation/Gait Ambulation/Gait assistance: Modified independent (Device/Increase time) Gait Distance (Feet): 500 Feet Assistive device: Straight cane Gait Pattern/deviations: Step-through pattern Gait velocity: WFL     General Gait Details: slight toe out, good reciprocal  pattern and LEs passing in stance phase  Stairs Stairs: Yes Stairs assistance: Contact guard assist Stair Management: No rails, With cane Number of Stairs: 5 General stair comments: SBA/CGA step to pattern min cues  Wheelchair Mobility     Tilt Bed    Modified Rankin (Stroke Patients Only)       Balance Overall balance assessment: No apparent balance deficits (not formally assessed) (no fall hx)                                           Pertinent Vitals/Pain Pain Assessment Pain Assessment: 0-10 Pain Score: 6  Pain Location: LBP Pain Descriptors / Indicators: Constant, Aching, Dull, Discomfort Pain Intervention(s): Limited activity within patient's tolerance, Monitored during session, Repositioned    Home Living Family/patient expects to be discharged to:: Private residence Living Arrangements: Other (Comment) (sister) Available Help at Discharge: Family Type of Home: House Home Access: Stairs to enter Entrance Stairs-Rails: Can reach both;Left;Right Entrance Stairs-Number of Steps: 6   Home Layout: One level Home Equipment: Cane - single point      Prior Function Prior Level of Function : Independent/Modified Independent;Driving             Mobility Comments: mod with use of SPC for all functional mobility       Extremity/Trunk Assessment   Upper Extremity Assessment Upper Extremity Assessment: Defer to OT evaluation  Lower Extremity Assessment Lower Extremity Assessment: Overall WFL for tasks assessed    Cervical / Trunk Assessment Cervical / Trunk Assessment: Normal  Communication   Communication Communication: No apparent difficulties  Cognition Arousal: Alert Behavior During Therapy: WFL for tasks assessed/performed Overall Cognitive Status: Within Functional Limits for tasks assessed                                          General Comments      Exercises     Assessment/Plan    PT  Assessment Patient does not need any further PT services  PT Problem List         PT Treatment Interventions      PT Goals (Current goals can be found in the Care Plan section)  Acute Rehab PT Goals Patient Stated Goal: to go home today PT Goal Formulation: With patient Time For Goal Achievement: 11/17/23 Potential to Achieve Goals: Good    Frequency       Co-evaluation               AM-PAC PT "6 Clicks" Mobility  Outcome Measure Help needed turning from your back to your side while in a flat bed without using bedrails?: None Help needed moving from lying on your back to sitting on the side of a flat bed without using bedrails?: None Help needed moving to and from a bed to a chair (including a wheelchair)?: None Help needed standing up from a chair using your arms (e.g., wheelchair or bedside chair)?: None Help needed to walk in hospital room?: None Help needed climbing 3-5 steps with a railing? : A Little 6 Click Score: 23    End of Session Equipment Utilized During Treatment: Gait belt Activity Tolerance: Patient tolerated treatment well Patient left: in chair;with call bell/phone within reach;with family/visitor present Nurse Communication: Mobility status PT Visit Diagnosis: Other abnormalities of gait and mobility (R26.89);Pain Pain - Right/Left:  (LBP)    Time: 5366-4403 PT Time Calculation (min) (ACUTE ONLY): 27 min   Charges:   PT Evaluation $PT Eval Low Complexity: 1 Low PT Treatments $Gait Training: 8-22 mins PT General Charges $$ ACUTE PT VISIT: 1 Visit         Johnny Bridge, PT Acute Rehab   Angel [name] 11/03/2023, 10:40 AM

## 2023-11-06 ENCOUNTER — Other Ambulatory Visit: Payer: Self-pay | Admitting: Nurse Practitioner

## 2023-11-06 DIAGNOSIS — C8300 Small cell B-cell lymphoma, unspecified site: Secondary | ICD-10-CM

## 2023-11-07 ENCOUNTER — Encounter: Payer: Self-pay | Admitting: Nurse Practitioner

## 2023-11-07 ENCOUNTER — Inpatient Hospital Stay (HOSPITAL_BASED_OUTPATIENT_CLINIC_OR_DEPARTMENT_OTHER): Payer: 59 | Admitting: Nurse Practitioner

## 2023-11-07 ENCOUNTER — Inpatient Hospital Stay: Payer: 59

## 2023-11-07 VITALS — BP 139/85 | HR 97 | Temp 98.2°F | Resp 18 | Ht 70.0 in | Wt 243.5 lb

## 2023-11-07 VITALS — BP 130/73 | HR 84 | Temp 98.1°F | Resp 18

## 2023-11-07 DIAGNOSIS — C187 Malignant neoplasm of sigmoid colon: Secondary | ICD-10-CM | POA: Diagnosis not present

## 2023-11-07 DIAGNOSIS — K760 Fatty (change of) liver, not elsewhere classified: Secondary | ICD-10-CM | POA: Diagnosis not present

## 2023-11-07 DIAGNOSIS — Z800 Family history of malignant neoplasm of digestive organs: Secondary | ICD-10-CM | POA: Diagnosis not present

## 2023-11-07 DIAGNOSIS — C8514 Unspecified B-cell lymphoma, lymph nodes of axilla and upper limb: Secondary | ICD-10-CM | POA: Diagnosis not present

## 2023-11-07 DIAGNOSIS — R5383 Other fatigue: Secondary | ICD-10-CM | POA: Diagnosis not present

## 2023-11-07 DIAGNOSIS — I10 Essential (primary) hypertension: Secondary | ICD-10-CM | POA: Diagnosis not present

## 2023-11-07 DIAGNOSIS — E119 Type 2 diabetes mellitus without complications: Secondary | ICD-10-CM | POA: Diagnosis not present

## 2023-11-07 DIAGNOSIS — Z79899 Other long term (current) drug therapy: Secondary | ICD-10-CM | POA: Diagnosis not present

## 2023-11-07 DIAGNOSIS — T451X5A Adverse effect of antineoplastic and immunosuppressive drugs, initial encounter: Secondary | ICD-10-CM | POA: Diagnosis not present

## 2023-11-07 DIAGNOSIS — Z5112 Encounter for antineoplastic immunotherapy: Secondary | ICD-10-CM | POA: Diagnosis not present

## 2023-11-07 DIAGNOSIS — G479 Sleep disorder, unspecified: Secondary | ICD-10-CM | POA: Diagnosis not present

## 2023-11-07 DIAGNOSIS — R718 Other abnormality of red blood cells: Secondary | ICD-10-CM | POA: Diagnosis not present

## 2023-11-07 DIAGNOSIS — C8300 Small cell B-cell lymphoma, unspecified site: Secondary | ICD-10-CM

## 2023-11-07 DIAGNOSIS — R918 Other nonspecific abnormal finding of lung field: Secondary | ICD-10-CM | POA: Diagnosis not present

## 2023-11-07 DIAGNOSIS — R590 Localized enlarged lymph nodes: Secondary | ICD-10-CM | POA: Diagnosis not present

## 2023-11-07 DIAGNOSIS — Z860100 Personal history of colon polyps, unspecified: Secondary | ICD-10-CM | POA: Diagnosis not present

## 2023-11-07 DIAGNOSIS — D701 Agranulocytosis secondary to cancer chemotherapy: Secondary | ICD-10-CM | POA: Diagnosis not present

## 2023-11-07 LAB — CBC WITH DIFFERENTIAL (CANCER CENTER ONLY)
Abs Immature Granulocytes: 0.02 10*3/uL (ref 0.00–0.07)
Basophils Absolute: 0 10*3/uL (ref 0.0–0.1)
Basophils Relative: 1 %
Eosinophils Absolute: 0.2 10*3/uL (ref 0.0–0.5)
Eosinophils Relative: 5 %
HCT: 50.5 % (ref 39.0–52.0)
Hemoglobin: 15.6 g/dL (ref 13.0–17.0)
Immature Granulocytes: 1 %
Lymphocytes Relative: 35 %
Lymphs Abs: 1.4 10*3/uL (ref 0.7–4.0)
MCH: 22.8 pg — ABNORMAL LOW (ref 26.0–34.0)
MCHC: 30.9 g/dL (ref 30.0–36.0)
MCV: 73.8 fL — ABNORMAL LOW (ref 80.0–100.0)
Monocytes Absolute: 1 10*3/uL (ref 0.1–1.0)
Monocytes Relative: 25 %
Neutro Abs: 1.3 10*3/uL — ABNORMAL LOW (ref 1.7–7.7)
Neutrophils Relative %: 33 %
Platelet Count: 172 10*3/uL (ref 150–400)
RBC: 6.84 MIL/uL — ABNORMAL HIGH (ref 4.22–5.81)
RDW: 18.5 % — ABNORMAL HIGH (ref 11.5–15.5)
WBC Count: 3.9 10*3/uL — ABNORMAL LOW (ref 4.0–10.5)
nRBC: 0 % (ref 0.0–0.2)

## 2023-11-07 LAB — CULTURE, BLOOD (SINGLE)
Culture: NO GROWTH
Special Requests: ADEQUATE

## 2023-11-07 LAB — PHOSPHORUS: Phosphorus: 3.5 mg/dL (ref 2.5–4.6)

## 2023-11-07 LAB — CMP (CANCER CENTER ONLY)
ALT: 40 U/L (ref 0–44)
AST: 36 U/L (ref 15–41)
Albumin: 4.6 g/dL (ref 3.5–5.0)
Alkaline Phosphatase: 111 U/L (ref 38–126)
Anion gap: 9 (ref 5–15)
BUN: 18 mg/dL (ref 8–23)
CO2: 25 mmol/L (ref 22–32)
Calcium: 9.6 mg/dL (ref 8.9–10.3)
Chloride: 106 mmol/L (ref 98–111)
Creatinine: 1.11 mg/dL (ref 0.61–1.24)
GFR, Estimated: >60 mL/min (ref >60)
Glucose, Bld: 206 mg/dL — ABNORMAL HIGH (ref 70–99)
Potassium: 4.3 mmol/L (ref 3.5–5.1)
Sodium: 140 mmol/L (ref 135–145)
Total Bilirubin: 0.5 mg/dL (ref <1.2)
Total Protein: 7.6 g/dL (ref 6.5–8.1)

## 2023-11-07 LAB — LACTATE DEHYDROGENASE: LDH: 271 U/L — ABNORMAL HIGH (ref 98–192)

## 2023-11-07 LAB — URIC ACID: Uric Acid, Serum: 5.3 mg/dL (ref 3.7–8.6)

## 2023-11-07 MED ORDER — DIPHENHYDRAMINE HCL 50 MG/ML IJ SOLN
50.0000 mg | Freq: Once | INTRAMUSCULAR | Status: AC
  Administered 2023-11-07: 50 mg via INTRAVENOUS
  Filled 2023-11-07: qty 1

## 2023-11-07 MED ORDER — SODIUM CHLORIDE 0.9 % IV SOLN: INTRAVENOUS | Status: DC

## 2023-11-07 MED ORDER — SODIUM CHLORIDE 0.9 % IV SOLN
1000.0000 mg | Freq: Once | INTRAVENOUS | Status: AC
  Administered 2023-11-07: 1000 mg via INTRAVENOUS
  Filled 2023-11-07: qty 40

## 2023-11-07 MED ORDER — ACETAMINOPHEN 325 MG PO TABS
650.0000 mg | ORAL_TABLET | Freq: Once | ORAL | Status: AC
  Administered 2023-11-07: 650 mg via ORAL
  Filled 2023-11-07: qty 2

## 2023-11-07 MED ORDER — SODIUM CHLORIDE 0.9 % IV SOLN
20.0000 mg | Freq: Once | INTRAVENOUS | Status: AC
  Administered 2023-11-07: 20 mg via INTRAVENOUS
  Filled 2023-11-07: qty 20

## 2023-11-07 MED ORDER — ALLOPURINOL 100 MG PO TABS: 100.0000 mg | ORAL_TABLET | Freq: Every day | ORAL | 0 refills | Status: DC

## 2023-11-07 NOTE — Patient Instructions (Signed)
[facility] - A DEPT OF [facility]   Discharge Instructions: Thank you for choosing [facility] to provide your oncology and hematology care.   If you have a lab appointment with the Cancer Center, please go directly to the Cancer Center and check in at the registration area.   Wear comfortable clothing and clothing appropriate for easy access to any Portacath or PICC line.   We strive to give you quality time with your provider. You may need to reschedule your appointment if you arrive late (15 or more minutes).  Arriving late affects you and other patients whose appointments are after yours.  Also, if you miss three or more appointments without notifying the office, you may be dismissed from the clinic at the provider's discretion.      For prescription refill requests, have your pharmacy contact our office and allow 72 hours for refills to be completed.    Today you received the following chemotherapy and/or immunotherapy agents Gazyva       To help prevent nausea and vomiting after your treatment, we encourage you to take your nausea medication as directed.  BELOW ARE SYMPTOMS THAT SHOULD BE REPORTED IMMEDIATELY: *FEVER GREATER THAN 100.4 F (38 C) OR HIGHER *CHILLS OR SWEATING *NAUSEA AND VOMITING THAT IS NOT CONTROLLED WITH YOUR NAUSEA MEDICATION *UNUSUAL SHORTNESS OF BREATH *UNUSUAL BRUISING OR BLEEDING *URINARY PROBLEMS (pain or burning when urinating, or frequent urination) *BOWEL PROBLEMS (unusual diarrhea, constipation, pain near the anus) TENDERNESS IN MOUTH AND THROAT WITH OR WITHOUT PRESENCE OF ULCERS (sore throat, sores in mouth, or a toothache) UNUSUAL RASH, SWELLING OR PAIN  UNUSUAL VAGINAL DISCHARGE OR ITCHING   Items with * indicate a potential emergency and should be followed up as soon as possible or go to the Emergency Department if any problems should occur.  Please show the CHEMOTHERAPY ALERT CARD or IMMUNOTHERAPY  ALERT CARD at check-in to the Emergency Department and triage nurse.  Should you have questions after your visit or need to cancel or reschedule your appointment, please contact [facility] - A DEPT OF [facility]  Dept: [phone]  and follow the prompts.  Office hours are 8:00 a.m. to 4:30 p.m. Monday - Friday. Please note that voicemails left after 4:00 p.m. may not be returned until the following business day.  We are closed weekends and major holidays. You have access to a nurse at all times for urgent questions. Please call the main number to the clinic Dept: [phone] and follow the prompts.   For any non-urgent questions, you may also contact your provider using MyChart. We now offer e-Visits for anyone 22 and older to request care online for non-urgent symptoms. For details visit [email or web address].   Also download the MyChart app! Go to the app store, search "MyChart", open the app, select [place], and log in with your MyChart username and password.  Obinutuzumab Injection What is this medication? OBINUTUZUMAB (OH bi nue TOOZ ue mab) treats leukemia and lymphoma. It works by blocking a protein that causes cancer cells to grow and multiply. This helps to slow or stop the spread of cancer cells. It is a monoclonal antibody. This medicine may be used for other purposes; ask your health care provider or pharmacist if you have questions. COMMON BRAND NAME(S): GAZYVA What should I tell my care team before I take this medication? They need to know if you have any of these conditions: Heart  disease Infection, especially a viral infection, such as hepatitis B Lung or breathing disease Take medications that treat or prevent blood clots An unusual or allergic reaction to obinutuzumab, other medications, foods, dyes, or preservatives Pregnant or trying to get pregnant Breastfeeding How should I use this medication? This medication is for infusion  into a vein. It is given by a care team in a hospital or clinic setting. Talk to your care team about the use of this medication in children. Special care may be needed. Overdosage: If you think you have taken too much of this medicine contact a poison control center or emergency room at once. NOTE: This medicine is only for you. Do not share this medicine with others. What if I miss a dose? Keep appointments for follow-up doses as directed. It is important not to miss your dose. Call your care team if you are unable to keep an appointment. What may interact with this medication? Live virus vaccines This list may not describe all possible interactions. Give your health care provider a list of all the medicines, herbs, non-prescription drugs, or dietary supplements you use. Also tell them if you smoke, drink alcohol, or use illegal drugs. Some items may interact with your medicine. What should I watch for while using this medication? Report any side effects that you notice during your treatment right away, such as changes in your breathing, fever, chills, dizziness or lightheadedness. These effects are more common with the first dose. Visit your care team for checks on your progress. You will need to have regular blood work. Report any other side effects. The side effects of this medication can continue after you finish your treatment. Continue your course of treatment even though you feel ill unless your care team tells you to stop. Call your care team for advice if you get a fever, chills or sore throat, or other symptoms of a cold or flu. Do not treat yourself. This medication decreases your body's ability to fight infections. Try to avoid being around people who are sick. This medication may increase your risk to bruise or bleed. Call your care team if you notice any unusual bleeding. Do not become pregnant while taking this medication or for 6 months after stopping it. Inform your care team if you  wish to become pregnant or think you might be pregnant. There is a potential for serious side effects to an unborn child. Talk to your care team or pharmacist for more information. Do not breast-feed an infant while taking this medication or for 6 months after stopping it. What side effects may I notice from receiving this medication? Side effects that you should report to your care team as soon as possible: Allergic reactions--skin rash, itching, hives, swelling of the face, lips, tongue, or throat Bleeding--bloody or black, tar-like stools, vomiting blood or brown material that looks like coffee grounds, red or dark brown urine, small red or purple spots on skin, unusual bruising or bleeding Blood clot--pain, swelling, or warmth in the leg, shortness of breath, chest pain Dizziness, loss of balance or coordination, confusion or trouble speaking Infection--fever, chills, cough, sore throat, wounds that don't heal, pain or trouble when passing urine, general feeling of discomfort or being unwell Infusion reactions--chest pain, shortness of breath or trouble breathing, feeling faint or lightheaded Liver injury--right upper belly pain, loss of appetite, nausea, light-colored stool, dark yellow or brown urine, yellowing skin or eyes, unusual weakness or fatigue Tumor lysis syndrome (TLS)--nausea, vomiting, diarrhea, decrease in the  amount of urine, dark urine, unusual weakness or fatigue, confusion, muscle pain or cramps, fast or irregular heartbeat, joint pain Side effects that usually do not require medical attention (report to your care team if they continue or are bothersome): Bone, joint, or muscle pain Constipation Diarrhea Fatigue Runny or stuffy nose Sore throat This list may not describe all possible side effects. Call your doctor for medical advice about side effects. You may report side effects to FDA at 1-800-FDA-1088. Where should I keep my medication? This medication is only given in a  hospital or clinic and will not be stored at home. NOTE: This sheet is a summary. It may not cover all possible information. If you have questions about this medicine, talk to your doctor, pharmacist, or health care provider.  2024 Elsevier/Gold Standard (2022-04-06 00:00:00)

## 2023-11-07 NOTE — Progress Notes (Signed)
Patient seen by Lonna Cobb NP today  Vitals are within treatment parameters:Yes   Labs are within treatment parameters: No (Please specify and give further instructions.) NEUT 1.3  Treatment plan has been signed: Yes   Per physician team, Patient is ready for treatment and there are NO modifications to the treatment plan.

## 2023-11-07 NOTE — Progress Notes (Signed)
[facility] OFFICE PROGRESS NOTE   Diagnosis: Small lymphocytic lymphoma  INTERVAL HISTORY:   Angel [name] returns as scheduled.  He was hospitalized 11/02/2023 with somnolence.  No etiology was determined.  He was alert and oriented the following day and was discharged home.  He resumed venetoclax at discharge, continued allopurinol.  No fevers or sweats.  He has a good appetite.  No nausea or vomiting.  No diarrhea.  No rash.  Objective:  Vital signs in last 24 hours:  Blood pressure 139/85, pulse 97, temperature 98.2 F (36.8 C), temperature source Temporal, resp. rate 18, height 5\' 10"  (1.778 m), weight 243 lb 8 oz (110.5 kg), SpO2 100%.    HEENT: No thrush.  No ulcers.   Lymphatics: Small bilateral lower cervical/scalene nodes.  Bilateral axillary nodes left greater than right. Resp: Lungs clear bilaterally. Cardio: Regular rate and rhythm. GI: No hepatosplenomegaly. Vascular: No leg edema. Skin: Alert and oriented.   Lab Results:  Lab Results  Component Value Date   WBC 3.9 (L) 11/07/2023   HGB 15.6 11/07/2023   HCT 50.5 11/07/2023   MCV 73.8 (L) 11/07/2023   PLT 172 11/07/2023   NEUTROABS 1.3 (L) 11/07/2023    Imaging:  No results found.  Medications: I have reviewed the patient's current medications.  Assessment/Plan: Colon cancer, sigmoid, stage IIIa (T1,N1), status post a partial colectomy 02/02/2017 1/26 lymph nodes positive MSI-stable, no loss of mismatch repair protein expression Staging CTs 12/29/2016-small indeterminate left lung nodules Normal preoperative CEA Cycle 1 CAPOX 03/03/2017 Cycle 2 CAPOX 03/24/2017 Cycle 3 CAPOX 04/14/2017 Cycle 4 CAPOX 05/05/2017 CTs 11/06/2018- negative for recurrent disease, small left lung nodule stable from 2007-consider benign Colonoscopy 05/29/2019-negative CTs 10/25/2019-mild increase in a 1 cm small bowel mesenteric lymph node, no evidence of recurrent disease CTs 10/20/2020-several small  left-sided lung nodule stable when compared to prior CTs.  No evidence of metastatic disease in the abdomen or pelvis.  Diffuse fatty infiltration of the liver.     2.  Indeterminate left lung nodules on a chest CT 12/29/2016, stable lung nodules on CT 10/25/2019   3.   Chronic red cell microcytosis   4.   Diabetes   5.   Hypertension   6.   Family history of colon cancer   7.   Multiple polyps noted on the colonoscopy 12/19/2016   8.   Neutropenia secondary to chemotherapy   9.   Extensive peripheral adenopathy on exam 08/21/2023-CTs neck through pelvis 08/24/2023 with extensive lymphadenopathy neck, chest, abdomen and pelvis.  Scattered tiny lung nodule stable from prior examination and compatible with a benign finding. 09/08/2023-right axillary lymph node biopsy-necrotic lymph node 09/22/2023-left axillary lymph node biopsy-75% of lymphocytes with CD5/CD200 positive lambda restricted on flow cytometry, surgical otology with a CD5 positive B-cell lymphoma, CD20, PAX5, cyclin D1 negative, perforation right 5-10%, CLL/SLL favored Cycle 1 day 1 obinutuzumab 10/03/2023, day 2 10/04/2023, day 8 10/09/2023, day 15 10/16/2023 Venetoclax 10/29/2023, ramp-up schedule Obinutuzumab and venetoclax placed on hold 10/31/2023 Venetoclax resumed 11/03/2023 Cycle 2 obinutuzumab 11/07/2023   10.  Positive hepatitis B core antibody and negative surface antigen 09/28/2023-repeat labs in 4 weeks. 11.  Herpetic lip lesions 10/09/2023-Valtrex  12.  Admission 11/02/2023 with somnolence  Disposition: Angel [name] appears stable.  He has completed 1 cycle of obinutuzumab.  He continues venetoclax on the "ramp-up" schedule (begins week 3 dose 11/11/2023).  He will continue allopurinol.  Peripheral adenopathy is significantly improved.  CBC and chemistry panel reviewed.  Labs adequate to proceed with obinutuzumab, continue venetoclax as above.  There is no evidence of tumor lysis syndrome.  He is alert and oriented  in the office today.  Etiology of the somnolence last week remains unclear.  Plan for weekly labs.  Next office visit in 3 weeks.  He will contact the office in the interim with any problems.  We specifically discussed fever, chills, other signs of infection.  Lonna Cobb ANP/GNP-BC   11/07/2023  9:14 AM

## 2023-11-08 ENCOUNTER — Other Ambulatory Visit: Payer: Self-pay

## 2023-11-13 ENCOUNTER — Encounter: Payer: Self-pay | Admitting: Nurse Practitioner

## 2023-11-14 ENCOUNTER — Inpatient Hospital Stay: Payer: 59

## 2023-11-16 ENCOUNTER — Other Ambulatory Visit (HOSPITAL_COMMUNITY): Payer: Self-pay

## 2023-11-16 ENCOUNTER — Other Ambulatory Visit: Payer: Self-pay | Admitting: Oncology

## 2023-11-16 DIAGNOSIS — C8300 Small cell B-cell lymphoma, unspecified site: Secondary | ICD-10-CM | POA: Diagnosis not present

## 2023-11-16 DIAGNOSIS — G4700 Insomnia, unspecified: Secondary | ICD-10-CM | POA: Diagnosis not present

## 2023-11-16 DIAGNOSIS — E1169 Type 2 diabetes mellitus with other specified complication: Secondary | ICD-10-CM | POA: Diagnosis not present

## 2023-11-16 DIAGNOSIS — E039 Hypothyroidism, unspecified: Secondary | ICD-10-CM | POA: Diagnosis not present

## 2023-11-16 DIAGNOSIS — C189 Malignant neoplasm of colon, unspecified: Secondary | ICD-10-CM | POA: Diagnosis not present

## 2023-11-16 DIAGNOSIS — R5383 Other fatigue: Secondary | ICD-10-CM | POA: Diagnosis not present

## 2023-11-16 DIAGNOSIS — I10 Essential (primary) hypertension: Secondary | ICD-10-CM | POA: Diagnosis not present

## 2023-11-16 DIAGNOSIS — Z794 Long term (current) use of insulin: Secondary | ICD-10-CM | POA: Diagnosis not present

## 2023-11-16 DIAGNOSIS — I700 Atherosclerosis of aorta: Secondary | ICD-10-CM | POA: Diagnosis not present

## 2023-11-16 DIAGNOSIS — Q632 Ectopic kidney: Secondary | ICD-10-CM | POA: Diagnosis not present

## 2023-11-16 NOTE — Progress Notes (Signed)
Specialty Pharmacy Ongoing Clinical Assessment Note  Angel [name] is a 72 y.o. male who is being followed by the specialty pharmacy service for RxSp Oncology   Patient's specialty medication(s) reviewed today: Venetoclax (VENCLEXTA)   Missed doses in the last 4 weeks: 0   Patient/Caregiver did not have any additional questions or concerns.   Therapeutic benefit summary: Patient is achieving benefit   Adverse events/side effects summary: No adverse events/side effects   Patient's therapy is appropriate to: Continue    Goals Addressed             This Visit's Progress    Achieve or maintain remission   No change    Patient is initiating therapy. Patient will maintain adherence         Follow up:  3 months  Servando Snare Specialty Pharmacist

## 2023-11-16 NOTE — Progress Notes (Signed)
Specialty Pharmacy Refill Coordination Note  Angel [name] is a 72 y.o. male contacted today regarding refills of specialty medication(s) Venetoclax [name])   Patient requested Delivery   Delivery date: 11/21/23   Verified address: Patient address [address]  [place] [address]   Medication will be filled on 11/20/23. *Pending refill request for the maintenance dose*

## 2023-11-16 NOTE — Telephone Encounter (Signed)
To MD to clarify current dose

## 2023-11-17 ENCOUNTER — Other Ambulatory Visit: Payer: Self-pay

## 2023-11-17 ENCOUNTER — Other Ambulatory Visit: Payer: Self-pay | Admitting: Nurse Practitioner

## 2023-11-17 ENCOUNTER — Inpatient Hospital Stay: Payer: 59

## 2023-11-17 DIAGNOSIS — Z5112 Encounter for antineoplastic immunotherapy: Secondary | ICD-10-CM | POA: Diagnosis not present

## 2023-11-17 DIAGNOSIS — R918 Other nonspecific abnormal finding of lung field: Secondary | ICD-10-CM | POA: Diagnosis not present

## 2023-11-17 DIAGNOSIS — T451X5A Adverse effect of antineoplastic and immunosuppressive drugs, initial encounter: Secondary | ICD-10-CM | POA: Diagnosis not present

## 2023-11-17 DIAGNOSIS — E119 Type 2 diabetes mellitus without complications: Secondary | ICD-10-CM | POA: Diagnosis not present

## 2023-11-17 DIAGNOSIS — C8514 Unspecified B-cell lymphoma, lymph nodes of axilla and upper limb: Secondary | ICD-10-CM | POA: Diagnosis not present

## 2023-11-17 DIAGNOSIS — R590 Localized enlarged lymph nodes: Secondary | ICD-10-CM | POA: Diagnosis not present

## 2023-11-17 DIAGNOSIS — G479 Sleep disorder, unspecified: Secondary | ICD-10-CM | POA: Diagnosis not present

## 2023-11-17 DIAGNOSIS — C187 Malignant neoplasm of sigmoid colon: Secondary | ICD-10-CM | POA: Diagnosis not present

## 2023-11-17 DIAGNOSIS — I10 Essential (primary) hypertension: Secondary | ICD-10-CM | POA: Diagnosis not present

## 2023-11-17 DIAGNOSIS — K760 Fatty (change of) liver, not elsewhere classified: Secondary | ICD-10-CM | POA: Diagnosis not present

## 2023-11-17 DIAGNOSIS — C8300 Small cell B-cell lymphoma, unspecified site: Secondary | ICD-10-CM

## 2023-11-17 DIAGNOSIS — R5383 Other fatigue: Secondary | ICD-10-CM | POA: Diagnosis not present

## 2023-11-17 DIAGNOSIS — Z860100 Personal history of colon polyps, unspecified: Secondary | ICD-10-CM | POA: Diagnosis not present

## 2023-11-17 DIAGNOSIS — R718 Other abnormality of red blood cells: Secondary | ICD-10-CM | POA: Diagnosis not present

## 2023-11-17 DIAGNOSIS — Z79899 Other long term (current) drug therapy: Secondary | ICD-10-CM | POA: Diagnosis not present

## 2023-11-17 DIAGNOSIS — Z800 Family history of malignant neoplasm of digestive organs: Secondary | ICD-10-CM | POA: Diagnosis not present

## 2023-11-17 DIAGNOSIS — D701 Agranulocytosis secondary to cancer chemotherapy: Secondary | ICD-10-CM | POA: Diagnosis not present

## 2023-11-17 LAB — LACTATE DEHYDROGENASE: LDH: 201 U/L — ABNORMAL HIGH (ref 98–192)

## 2023-11-17 LAB — CBC WITH DIFFERENTIAL (CANCER CENTER ONLY)
Abs Immature Granulocytes: 0.03 10*3/uL (ref 0.00–0.07)
Basophils Absolute: 0 10*3/uL (ref 0.0–0.1)
Basophils Relative: 1 %
Eosinophils Absolute: 0.2 10*3/uL (ref 0.0–0.5)
Eosinophils Relative: 3 %
HCT: 46.9 % (ref 39.0–52.0)
Hemoglobin: 14.7 g/dL (ref 13.0–17.0)
Immature Granulocytes: 1 %
Lymphocytes Relative: 43 %
Lymphs Abs: 1.9 10*3/uL (ref 0.7–4.0)
MCH: 23.4 pg — ABNORMAL LOW (ref 26.0–34.0)
MCHC: 31.3 g/dL (ref 30.0–36.0)
MCV: 74.8 fL — ABNORMAL LOW (ref 80.0–100.0)
Monocytes Absolute: 1 10*3/uL (ref 0.1–1.0)
Monocytes Relative: 22 %
Neutro Abs: 1.3 10*3/uL — ABNORMAL LOW (ref 1.7–7.7)
Neutrophils Relative %: 30 %
Platelet Count: 167 10*3/uL (ref 150–400)
RBC: 6.27 MIL/uL — ABNORMAL HIGH (ref 4.22–5.81)
RDW: 18.6 % — ABNORMAL HIGH (ref 11.5–15.5)
WBC Count: 4.4 10*3/uL (ref 4.0–10.5)
nRBC: 0 % (ref 0.0–0.2)

## 2023-11-17 LAB — PHOSPHORUS: Phosphorus: 3.5 mg/dL (ref 2.5–4.6)

## 2023-11-17 LAB — URIC ACID: Uric Acid, Serum: 4 mg/dL (ref 3.7–8.6)

## 2023-11-17 LAB — CMP (CANCER CENTER ONLY)
ALT: 35 U/L (ref 0–44)
AST: 33 U/L (ref 15–41)
Albumin: 4.4 g/dL (ref 3.5–5.0)
Alkaline Phosphatase: 95 U/L (ref 38–126)
Anion gap: 8 (ref 5–15)
BUN: 11 mg/dL (ref 8–23)
CO2: 27 mmol/L (ref 22–32)
Calcium: 9.3 mg/dL (ref 8.9–10.3)
Chloride: 103 mmol/L (ref 98–111)
Creatinine: 0.96 mg/dL (ref 0.61–1.24)
GFR, Estimated: >60 mL/min (ref >60)
Glucose, Bld: 129 mg/dL — ABNORMAL HIGH (ref 70–99)
Potassium: 3.9 mmol/L (ref 3.5–5.1)
Sodium: 138 mmol/L (ref 135–145)
Total Bilirubin: 0.6 mg/dL (ref <1.2)
Total Protein: 7.2 g/dL (ref 6.5–8.1)

## 2023-11-20 ENCOUNTER — Encounter: Payer: Self-pay | Admitting: Oncology

## 2023-11-20 ENCOUNTER — Other Ambulatory Visit: Payer: Self-pay | Admitting: *Deleted

## 2023-11-20 ENCOUNTER — Other Ambulatory Visit: Payer: Self-pay

## 2023-11-20 MED ORDER — VENETOCLAX 100 MG PO TABS
400.0000 mg | ORAL_TABLET | Freq: Every day | ORAL | 1 refills | Status: DC
  Filled 2023-11-21: qty 120, 30d supply, fill #0
  Filled 2023-12-12: qty 120, 30d supply, fill #1

## 2023-11-20 NOTE — Telephone Encounter (Signed)
Per Dr. Truett Perna, maintenance Venetoclax is 400 mg and will begin on 11/27/23

## 2023-11-21 ENCOUNTER — Other Ambulatory Visit: Payer: Self-pay

## 2023-11-21 ENCOUNTER — Inpatient Hospital Stay (HOSPITAL_BASED_OUTPATIENT_CLINIC_OR_DEPARTMENT_OTHER): Payer: 59 | Admitting: Oncology

## 2023-11-21 ENCOUNTER — Inpatient Hospital Stay: Payer: 59

## 2023-11-21 VITALS — BP 100/79 | HR 82 | Temp 97.8°F | Resp 18 | Ht 70.0 in | Wt 245.5 lb

## 2023-11-21 DIAGNOSIS — R918 Other nonspecific abnormal finding of lung field: Secondary | ICD-10-CM | POA: Diagnosis not present

## 2023-11-21 DIAGNOSIS — C8514 Unspecified B-cell lymphoma, lymph nodes of axilla and upper limb: Secondary | ICD-10-CM | POA: Diagnosis not present

## 2023-11-21 DIAGNOSIS — I10 Essential (primary) hypertension: Secondary | ICD-10-CM | POA: Diagnosis not present

## 2023-11-21 DIAGNOSIS — R5383 Other fatigue: Secondary | ICD-10-CM | POA: Diagnosis not present

## 2023-11-21 DIAGNOSIS — D701 Agranulocytosis secondary to cancer chemotherapy: Secondary | ICD-10-CM | POA: Diagnosis not present

## 2023-11-21 DIAGNOSIS — R590 Localized enlarged lymph nodes: Secondary | ICD-10-CM | POA: Diagnosis not present

## 2023-11-21 DIAGNOSIS — C187 Malignant neoplasm of sigmoid colon: Secondary | ICD-10-CM | POA: Diagnosis not present

## 2023-11-21 DIAGNOSIS — E119 Type 2 diabetes mellitus without complications: Secondary | ICD-10-CM | POA: Diagnosis not present

## 2023-11-21 DIAGNOSIS — G479 Sleep disorder, unspecified: Secondary | ICD-10-CM | POA: Diagnosis not present

## 2023-11-21 DIAGNOSIS — R718 Other abnormality of red blood cells: Secondary | ICD-10-CM | POA: Diagnosis not present

## 2023-11-21 DIAGNOSIS — Z860100 Personal history of colon polyps, unspecified: Secondary | ICD-10-CM | POA: Diagnosis not present

## 2023-11-21 DIAGNOSIS — T451X5A Adverse effect of antineoplastic and immunosuppressive drugs, initial encounter: Secondary | ICD-10-CM | POA: Diagnosis not present

## 2023-11-21 DIAGNOSIS — C8300 Small cell B-cell lymphoma, unspecified site: Secondary | ICD-10-CM

## 2023-11-21 DIAGNOSIS — Z800 Family history of malignant neoplasm of digestive organs: Secondary | ICD-10-CM | POA: Diagnosis not present

## 2023-11-21 DIAGNOSIS — Z5112 Encounter for antineoplastic immunotherapy: Secondary | ICD-10-CM | POA: Diagnosis not present

## 2023-11-21 DIAGNOSIS — Z79899 Other long term (current) drug therapy: Secondary | ICD-10-CM | POA: Diagnosis not present

## 2023-11-21 DIAGNOSIS — K760 Fatty (change of) liver, not elsewhere classified: Secondary | ICD-10-CM | POA: Diagnosis not present

## 2023-11-21 LAB — CMP (CANCER CENTER ONLY)
ALT: 29 U/L (ref 0–44)
AST: 31 U/L (ref 15–41)
Albumin: 4.2 g/dL (ref 3.5–5.0)
Alkaline Phosphatase: 88 U/L (ref 38–126)
Anion gap: 6 (ref 5–15)
BUN: 17 mg/dL (ref 8–23)
CO2: 28 mmol/L (ref 22–32)
Calcium: 9 mg/dL (ref 8.9–10.3)
Chloride: 104 mmol/L (ref 98–111)
Creatinine: 1.22 mg/dL (ref 0.61–1.24)
GFR, Estimated: >60 mL/min (ref >60)
Glucose, Bld: 119 mg/dL — ABNORMAL HIGH (ref 70–99)
Potassium: 4.1 mmol/L (ref 3.5–5.1)
Sodium: 138 mmol/L (ref 135–145)
Total Bilirubin: 0.5 mg/dL (ref <1.2)
Total Protein: 6.7 g/dL (ref 6.5–8.1)

## 2023-11-21 LAB — PHOSPHORUS: Phosphorus: 3.5 mg/dL (ref 2.5–4.6)

## 2023-11-21 LAB — LACTATE DEHYDROGENASE: LDH: 209 U/L — ABNORMAL HIGH (ref 98–192)

## 2023-11-21 LAB — CBC WITH DIFFERENTIAL (CANCER CENTER ONLY)
Abs Immature Granulocytes: 0.03 10*3/uL (ref 0.00–0.07)
Basophils Absolute: 0 10*3/uL (ref 0.0–0.1)
Basophils Relative: 1 %
Eosinophils Absolute: 0.1 10*3/uL (ref 0.0–0.5)
Eosinophils Relative: 3 %
HCT: 46.5 % (ref 39.0–52.0)
Hemoglobin: 14.5 g/dL (ref 13.0–17.0)
Immature Granulocytes: 1 %
Lymphocytes Relative: 43 %
Lymphs Abs: 2 10*3/uL (ref 0.7–4.0)
MCH: 23.3 pg — ABNORMAL LOW (ref 26.0–34.0)
MCHC: 31.2 g/dL (ref 30.0–36.0)
MCV: 74.9 fL — ABNORMAL LOW (ref 80.0–100.0)
Monocytes Absolute: 0.9 10*3/uL (ref 0.1–1.0)
Monocytes Relative: 20 %
Neutro Abs: 1.5 10*3/uL — ABNORMAL LOW (ref 1.7–7.7)
Neutrophils Relative %: 32 %
Platelet Count: 163 10*3/uL (ref 150–400)
RBC: 6.21 MIL/uL — ABNORMAL HIGH (ref 4.22–5.81)
RDW: 18.8 % — ABNORMAL HIGH (ref 11.5–15.5)
WBC Count: 4.5 10*3/uL (ref 4.0–10.5)
nRBC: 0 % (ref 0.0–0.2)

## 2023-11-21 LAB — URIC ACID: Uric Acid, Serum: 3.3 mg/dL — ABNORMAL LOW (ref 3.7–8.6)

## 2023-11-21 NOTE — Progress Notes (Signed)
11/21/23 - Venclexta  Left patient a voicemail to inform him that we will ship out today and he'll receive it on 11/23/23.

## 2023-11-21 NOTE — Progress Notes (Signed)
[facility] OFFICE PROGRESS NOTE   Diagnosis: CLL  INTERVAL HISTORY:   Angel [name] returns as scheduled.  He was last treated with obinutuzumab on 11/07/2023.  He continues venetoclax.  No rash, fever, nausea, or diarrhea.  He feels well.  Palpable lymph nodes have decreased in size.  Objective:  Vital signs in last 24 hours:  Blood pressure 100/79, pulse 82, temperature 97.8 F (36.6 C), resp. rate 18, height 5\' 10"  (1.778 m), weight 245 lb 8 oz (111.4 kg), SpO2 100%.    HEENT: No thrush or ulcers Lymphatics: Soft approximate 1 cm bilateral axillary nodes.  No cervical, supraclavicular, or inguinal nodes Resp: Lungs with inspiratory rhonchi at the lower posterior chest bilaterally, no respiratory distress Cardio: Regular rate and rhythm GI: No hepatosplenomegaly, no mass Vascular: No leg edema   Lab Results  Component Value Date   WBC 4.5 11/21/2023   HGB 14.5 11/21/2023   HCT 46.5 11/21/2023   MCV 74.9 (L) 11/21/2023   PLT 163 11/21/2023   NEUTROABS 1.5 (L) 11/21/2023    CMP  Lab Results  Component Value Date   NA 138 11/21/2023   K 4.1 11/21/2023   CL 104 11/21/2023   CO2 28 11/21/2023   GLUCOSE 119 (H) 11/21/2023   BUN 17 11/21/2023   CREATININE 1.22 11/21/2023   CALCIUM 9.0 11/21/2023   PROT 6.7 11/21/2023   ALBUMIN 4.2 11/21/2023   AST 31 11/21/2023   ALT 29 11/21/2023   ALKPHOS 88 11/21/2023   BILITOT 0.5 11/21/2023   GFRNONAA >60 11/21/2023   GFRAA >60 05/08/2020    Lab Results  Component Value Date   CEA1 1.52 10/19/2020   CEA 2.09 08/21/2023    No results found for: "INR", "LABPROT"  Imaging:  No results found.  Medications: I have reviewed the patient's current medications.   Assessment/Plan: Colon cancer, sigmoid, stage IIIa (T1,N1), status post a partial colectomy 02/02/2017 1/26 lymph nodes positive MSI-stable, no loss of mismatch repair protein expression Staging CTs 12/29/2016-small indeterminate left lung  nodules Normal preoperative CEA Cycle 1 CAPOX 03/03/2017 Cycle 2 CAPOX 03/24/2017 Cycle 3 CAPOX 04/14/2017 Cycle 4 CAPOX 05/05/2017 CTs 11/06/2018- negative for recurrent disease, small left lung nodule stable from 2007-consider benign Colonoscopy 05/29/2019-negative CTs 10/25/2019-mild increase in a 1 cm small bowel mesenteric lymph node, no evidence of recurrent disease CTs 10/20/2020-several small left-sided lung nodule stable when compared to prior CTs.  No evidence of metastatic disease in the abdomen or pelvis.  Diffuse fatty infiltration of the liver.     2.  Indeterminate left lung nodules on a chest CT 12/29/2016, stable lung nodules on CT 10/25/2019   3.   Chronic red cell microcytosis   4.   Diabetes   5.   Hypertension   6.   Family history of colon cancer   7.   Multiple polyps noted on the colonoscopy 12/19/2016   8.   Neutropenia secondary to chemotherapy   9.   Extensive peripheral adenopathy on exam 08/21/2023-CTs neck through pelvis 08/24/2023 with extensive lymphadenopathy neck, chest, abdomen and pelvis.  Scattered tiny lung nodule stable from prior examination and compatible with a benign finding. 09/08/2023-right axillary lymph node biopsy-necrotic lymph node 09/22/2023-left axillary lymph node biopsy-75% of lymphocytes with CD5/CD200 positive lambda restricted on flow cytometry, surgical otology with a CD5 positive B-cell lymphoma, CD20, PAX5, cyclin D1 negative, perforation right 5-10%, CLL/SLL favored Cycle 1 day 1 obinutuzumab 10/03/2023, day 2 10/04/2023, day 8 10/09/2023, day 15 10/16/2023 Venetoclax  10/29/2023, ramp-up schedule Obinutuzumab and venetoclax placed on hold 10/31/2023 Venetoclax resumed 11/03/2023 Cycle 2 obinutuzumab 11/07/2023   10.  Positive hepatitis B core antibody and negative surface antigen 09/28/2023-repeat labs in 4 weeks. 11.  Herpetic lip lesions 10/09/2023-Valtrex  12.  Admission 11/02/2023 with somnolence    Disposition: [name] appears well.  He is tolerating the venetoclax/obinutuzumab well.  The palpable lymphadenopathy has almost completely resolved.  He will continue the venetoclax dose escalation.  He will return for an office visit and obinutuzumab on 12/04/2022.  Angel Papas, MD  11/21/2023  10:18 AM

## 2023-11-23 ENCOUNTER — Other Ambulatory Visit: Payer: Self-pay

## 2023-11-27 ENCOUNTER — Inpatient Hospital Stay (HOSPITAL_BASED_OUTPATIENT_CLINIC_OR_DEPARTMENT_OTHER): Payer: 59 | Admitting: Oncology

## 2023-11-27 ENCOUNTER — Inpatient Hospital Stay: Payer: 59

## 2023-11-27 VITALS — BP 120/70 | HR 83 | Temp 98.1°F | Resp 18 | Ht 70.0 in | Wt 247.4 lb

## 2023-11-27 DIAGNOSIS — K760 Fatty (change of) liver, not elsewhere classified: Secondary | ICD-10-CM | POA: Diagnosis not present

## 2023-11-27 DIAGNOSIS — Z800 Family history of malignant neoplasm of digestive organs: Secondary | ICD-10-CM | POA: Diagnosis not present

## 2023-11-27 DIAGNOSIS — Z79899 Other long term (current) drug therapy: Secondary | ICD-10-CM | POA: Diagnosis not present

## 2023-11-27 DIAGNOSIS — I10 Essential (primary) hypertension: Secondary | ICD-10-CM | POA: Diagnosis not present

## 2023-11-27 DIAGNOSIS — C8300 Small cell B-cell lymphoma, unspecified site: Secondary | ICD-10-CM | POA: Diagnosis not present

## 2023-11-27 DIAGNOSIS — Z5112 Encounter for antineoplastic immunotherapy: Secondary | ICD-10-CM | POA: Diagnosis not present

## 2023-11-27 DIAGNOSIS — R5383 Other fatigue: Secondary | ICD-10-CM | POA: Diagnosis not present

## 2023-11-27 DIAGNOSIS — T451X5A Adverse effect of antineoplastic and immunosuppressive drugs, initial encounter: Secondary | ICD-10-CM | POA: Diagnosis not present

## 2023-11-27 DIAGNOSIS — R918 Other nonspecific abnormal finding of lung field: Secondary | ICD-10-CM | POA: Diagnosis not present

## 2023-11-27 DIAGNOSIS — R590 Localized enlarged lymph nodes: Secondary | ICD-10-CM | POA: Diagnosis not present

## 2023-11-27 DIAGNOSIS — D701 Agranulocytosis secondary to cancer chemotherapy: Secondary | ICD-10-CM | POA: Diagnosis not present

## 2023-11-27 DIAGNOSIS — R718 Other abnormality of red blood cells: Secondary | ICD-10-CM | POA: Diagnosis not present

## 2023-11-27 DIAGNOSIS — C187 Malignant neoplasm of sigmoid colon: Secondary | ICD-10-CM | POA: Diagnosis not present

## 2023-11-27 DIAGNOSIS — E119 Type 2 diabetes mellitus without complications: Secondary | ICD-10-CM | POA: Diagnosis not present

## 2023-11-27 DIAGNOSIS — G479 Sleep disorder, unspecified: Secondary | ICD-10-CM | POA: Diagnosis not present

## 2023-11-27 DIAGNOSIS — C8514 Unspecified B-cell lymphoma, lymph nodes of axilla and upper limb: Secondary | ICD-10-CM | POA: Diagnosis not present

## 2023-11-27 DIAGNOSIS — Z860100 Personal history of colon polyps, unspecified: Secondary | ICD-10-CM | POA: Diagnosis not present

## 2023-11-27 LAB — CMP (CANCER CENTER ONLY)
ALT: 26 U/L (ref 0–44)
AST: 27 U/L (ref 15–41)
Albumin: 4.3 g/dL (ref 3.5–5.0)
Alkaline Phosphatase: 110 U/L (ref 38–126)
Anion gap: 7 (ref 5–15)
BUN: 13 mg/dL (ref 8–23)
CO2: 27 mmol/L (ref 22–32)
Calcium: 8.9 mg/dL (ref 8.9–10.3)
Chloride: 104 mmol/L (ref 98–111)
Creatinine: 1.01 mg/dL (ref 0.61–1.24)
GFR, Estimated: >60 mL/min (ref >60)
Glucose, Bld: 130 mg/dL — ABNORMAL HIGH (ref 70–99)
Potassium: 4.1 mmol/L (ref 3.5–5.1)
Sodium: 138 mmol/L (ref 135–145)
Total Bilirubin: 0.4 mg/dL (ref 0.0–1.2)
Total Protein: 6.7 g/dL (ref 6.5–8.1)

## 2023-11-27 LAB — CBC WITH DIFFERENTIAL (CANCER CENTER ONLY)
Abs Immature Granulocytes: 0.02 10*3/uL (ref 0.00–0.07)
Basophils Absolute: 0 10*3/uL (ref 0.0–0.1)
Basophils Relative: 0 %
Eosinophils Absolute: 0.1 10*3/uL (ref 0.0–0.5)
Eosinophils Relative: 2 %
HCT: 46.2 % (ref 39.0–52.0)
Hemoglobin: 14.7 g/dL (ref 13.0–17.0)
Immature Granulocytes: 1 %
Lymphocytes Relative: 52 %
Lymphs Abs: 1.8 10*3/uL (ref 0.7–4.0)
MCH: 24.1 pg — ABNORMAL LOW (ref 26.0–34.0)
MCHC: 31.8 g/dL (ref 30.0–36.0)
MCV: 75.6 fL — ABNORMAL LOW (ref 80.0–100.0)
Monocytes Absolute: 0.8 10*3/uL (ref 0.1–1.0)
Monocytes Relative: 21 %
Neutro Abs: 0.9 10*3/uL — ABNORMAL LOW (ref 1.7–7.7)
Neutrophils Relative %: 24 %
Platelet Count: 178 10*3/uL (ref 150–400)
RBC: 6.11 MIL/uL — ABNORMAL HIGH (ref 4.22–5.81)
RDW: 19.3 % — ABNORMAL HIGH (ref 11.5–15.5)
WBC Count: 3.6 10*3/uL — ABNORMAL LOW (ref 4.0–10.5)
nRBC: 0 % (ref 0.0–0.2)

## 2023-11-27 LAB — URIC ACID: Uric Acid, Serum: 2.4 mg/dL — ABNORMAL LOW (ref 3.7–8.6)

## 2023-11-27 LAB — PHOSPHORUS: Phosphorus: 3.1 mg/dL (ref 2.5–4.6)

## 2023-11-27 LAB — LACTATE DEHYDROGENASE: LDH: 179 U/L (ref 98–192)

## 2023-11-27 NOTE — Progress Notes (Signed)
[facility] OFFICE PROGRESS NOTE   Diagnosis: CLL  INTERVAL HISTORY:   Mr. Virola continues treatment with obinutuzumab/venetoclax.  No nausea, rash, diarrhea, or mouth sores.  He no longer has palpable lymph nodes.  No complaint.  He began venetoclax at a dose of 100 mg daily on 11/26/2023.  Objective:  Vital signs in last 24 hours:  Blood pressure 120/70, pulse 83, temperature 98.1 F (36.7 C), temperature source Temporal, resp. rate 18, height 5\' 10"  (1.778 m), weight 247 lb 6.4 oz (112.2 kg), SpO2 98%.    HEENT: No thrush or ulcers Lymphatics: No cervical, supraclavicular, inguinal, or femoral nodes.  Prominent right greater than left axillary fat pads versus 1-3 cm soft mobile lymph nodes Resp: Inspiratory rhonchi at the lower posterior chest bilaterally, no respiratory distress Cardio: Regular rate and rhythm GI: No hepatomegaly, no mass, nontender Vascular: No leg edema  Lab Results:  Lab Results  Component Value Date   WBC 3.6 (L) 11/27/2023   HGB 14.7 11/27/2023   HCT 46.2 11/27/2023   MCV 75.6 (L) 11/27/2023   PLT 178 11/27/2023   NEUTROABS 0.9 (L) 11/27/2023    CMP  Lab Results  Component Value Date   NA 138 11/27/2023   K 4.1 11/27/2023   CL 104 11/27/2023   CO2 27 11/27/2023   GLUCOSE 130 (H) 11/27/2023   BUN 13 11/27/2023   CREATININE 1.01 11/27/2023   CALCIUM 8.9 11/27/2023   PROT 6.7 11/27/2023   ALBUMIN 4.3 11/27/2023   AST 27 11/27/2023   ALT 26 11/27/2023   ALKPHOS 110 11/27/2023   BILITOT 0.4 11/27/2023   GFRNONAA >60 11/27/2023   GFRAA >60 05/08/2020    Lab Results  Component Value Date   CEA1 1.52 10/19/2020   CEA 2.09 08/21/2023    No results found for: "INR", "LABPROT"  Imaging:  No results found.  Medications: I have reviewed the patient's current medications.   Assessment/Plan: Colon cancer, sigmoid, stage IIIa (T1,N1), status post a partial colectomy 02/02/2017 1/26 lymph nodes  positive MSI-stable, no loss of mismatch repair protein expression Staging CTs 12/29/2016-small indeterminate left lung nodules Normal preoperative CEA Cycle 1 CAPOX 03/03/2017 Cycle 2 CAPOX 03/24/2017 Cycle 3 CAPOX 04/14/2017 Cycle 4 CAPOX 05/05/2017 CTs 11/06/2018- negative for recurrent disease, small left lung nodule stable from 2007-consider benign Colonoscopy 05/29/2019-negative CTs 10/25/2019-mild increase in a 1 cm small bowel mesenteric lymph node, no evidence of recurrent disease CTs 10/20/2020-several small left-sided lung nodule stable when compared to prior CTs.  No evidence of metastatic disease in the abdomen or pelvis.  Diffuse fatty infiltration of the liver.     2.  Indeterminate left lung nodules on a chest CT 12/29/2016, stable lung nodules on CT 10/25/2019   3.   Chronic red cell microcytosis   4.   Diabetes   5.   Hypertension   6.   Family history of colon cancer   7.   Multiple polyps noted on the colonoscopy 12/19/2016   8.   Neutropenia secondary to chemotherapy   9.   Extensive peripheral adenopathy on exam 08/21/2023-CTs neck through pelvis 08/24/2023 with extensive lymphadenopathy neck, chest, abdomen and pelvis.  Scattered tiny lung nodule stable from prior examination and compatible with a benign finding. 09/08/2023-right axillary lymph node biopsy-necrotic lymph node 09/22/2023-left axillary lymph node biopsy-75% of lymphocytes with CD5/CD200 positive lambda restricted on flow cytometry, surgical otology with a CD5 positive B-cell lymphoma, CD20, PAX5, cyclin D1 negative, perforation right 5-10%, CLL/SLL favored Cycle  1 day 1 obinutuzumab 10/03/2023, day 2 10/04/2023, day 8 10/09/2023, day 15 10/16/2023 Venetoclax 10/29/2023, ramp-up schedule Obinutuzumab and venetoclax placed on hold 10/31/2023 Venetoclax resumed 11/03/2023 Cycle 2 obinutuzumab 11/07/2023 Venetoclax dose escalated to 100 mg daily 11/26/2023   10.  Positive hepatitis B core antibody and  negative surface antigen 09/28/2023-repeat labs in 4 weeks. 11.  Herpetic lip lesions 10/09/2023-Valtrex  12.  Admission 11/02/2023 with somnolence      Disposition: Mr. Ketch appears stable.  He is tolerating the obinutuzumab and venetoclax well.  He has mild neutropenia today.  He will return for a CBC on 11/30/2023.  He will call for a fever or symptoms of infection.  He will return for an office visit and obinutuzumab on 12/05/2023.  We will place venetoclax on hold or reduce the dose if the neutrophil count is lower on 11/30/2023.  He continues Valtrex prophylaxis.  We will repeat a hepatitis B surface antigen when he is here on 12/04/2022.  Thornton Papas, MD  11/27/2023  2:55 PM

## 2023-11-28 ENCOUNTER — Other Ambulatory Visit: Payer: Medicare Other

## 2023-11-28 ENCOUNTER — Inpatient Hospital Stay: Payer: 59

## 2023-11-28 ENCOUNTER — Inpatient Hospital Stay: Payer: 59 | Admitting: Oncology

## 2023-11-30 ENCOUNTER — Inpatient Hospital Stay: Payer: Medicare (Managed Care) | Attending: Oncology

## 2023-11-30 ENCOUNTER — Telehealth: Payer: Self-pay | Admitting: *Deleted

## 2023-11-30 DIAGNOSIS — Z7969 Long term (current) use of other immunomodulators and immunosuppressants: Secondary | ICD-10-CM | POA: Diagnosis not present

## 2023-11-30 DIAGNOSIS — E119 Type 2 diabetes mellitus without complications: Secondary | ICD-10-CM | POA: Diagnosis not present

## 2023-11-30 DIAGNOSIS — Z5112 Encounter for antineoplastic immunotherapy: Secondary | ICD-10-CM | POA: Insufficient documentation

## 2023-11-30 DIAGNOSIS — Z79899 Other long term (current) drug therapy: Secondary | ICD-10-CM | POA: Diagnosis not present

## 2023-11-30 DIAGNOSIS — K760 Fatty (change of) liver, not elsewhere classified: Secondary | ICD-10-CM | POA: Diagnosis not present

## 2023-11-30 DIAGNOSIS — T451X5A Adverse effect of antineoplastic and immunosuppressive drugs, initial encounter: Secondary | ICD-10-CM | POA: Insufficient documentation

## 2023-11-30 DIAGNOSIS — R718 Other abnormality of red blood cells: Secondary | ICD-10-CM | POA: Insufficient documentation

## 2023-11-30 DIAGNOSIS — D701 Agranulocytosis secondary to cancer chemotherapy: Secondary | ICD-10-CM | POA: Insufficient documentation

## 2023-11-30 DIAGNOSIS — Z79620 Long term (current) use of immunosuppressive biologic: Secondary | ICD-10-CM | POA: Insufficient documentation

## 2023-11-30 DIAGNOSIS — C8300 Small cell B-cell lymphoma, unspecified site: Secondary | ICD-10-CM

## 2023-11-30 DIAGNOSIS — Z800 Family history of malignant neoplasm of digestive organs: Secondary | ICD-10-CM | POA: Diagnosis not present

## 2023-11-30 DIAGNOSIS — Z860100 Personal history of colon polyps, unspecified: Secondary | ICD-10-CM | POA: Diagnosis not present

## 2023-11-30 DIAGNOSIS — C8514 Unspecified B-cell lymphoma, lymph nodes of axilla and upper limb: Secondary | ICD-10-CM | POA: Diagnosis present

## 2023-11-30 DIAGNOSIS — I10 Essential (primary) hypertension: Secondary | ICD-10-CM | POA: Diagnosis not present

## 2023-11-30 DIAGNOSIS — C187 Malignant neoplasm of sigmoid colon: Secondary | ICD-10-CM | POA: Diagnosis present

## 2023-11-30 LAB — CBC WITH DIFFERENTIAL (CANCER CENTER ONLY)
Abs Immature Granulocytes: 0.02 10*3/uL (ref 0.00–0.07)
Basophils Absolute: 0 10*3/uL (ref 0.0–0.1)
Basophils Relative: 1 %
Eosinophils Absolute: 0.1 10*3/uL (ref 0.0–0.5)
Eosinophils Relative: 3 %
HCT: 47.9 % (ref 39.0–52.0)
Hemoglobin: 15.3 g/dL (ref 13.0–17.0)
Immature Granulocytes: 1 %
Lymphocytes Relative: 50 %
Lymphs Abs: 1.8 10*3/uL (ref 0.7–4.0)
MCH: 24 pg — ABNORMAL LOW (ref 26.0–34.0)
MCHC: 31.9 g/dL (ref 30.0–36.0)
MCV: 75.2 fL — ABNORMAL LOW (ref 80.0–100.0)
Monocytes Absolute: 0.7 10*3/uL (ref 0.1–1.0)
Monocytes Relative: 21 %
Neutro Abs: 0.9 10*3/uL — ABNORMAL LOW (ref 1.7–7.7)
Neutrophils Relative %: 24 %
Platelet Count: 176 10*3/uL (ref 150–400)
RBC: 6.37 MIL/uL — ABNORMAL HIGH (ref 4.22–5.81)
RDW: 18.7 % — ABNORMAL HIGH (ref 11.5–15.5)
WBC Count: 3.5 10*3/uL — ABNORMAL LOW (ref 4.0–10.5)
nRBC: 0 % (ref 0.0–0.2)

## 2023-11-30 NOTE — Telephone Encounter (Signed)
-----   Message from Thornton Papas sent at 11/30/2023  1:51 PM EST ----- Please call patient, the white count is stable, continue venetoclax at the 100 mg daily dose, did not increase the dose, call for a fever, follow-up as scheduled

## 2023-11-30 NOTE — Telephone Encounter (Signed)
 Notified of stable WBC. Continue to take only 100 mg Venetoclax daily. Call for fever or s/s/ infection.

## 2023-12-01 ENCOUNTER — Other Ambulatory Visit: Payer: Self-pay | Admitting: Nurse Practitioner

## 2023-12-01 DIAGNOSIS — C8300 Small cell B-cell lymphoma, unspecified site: Secondary | ICD-10-CM

## 2023-12-05 ENCOUNTER — Inpatient Hospital Stay: Payer: Medicare (Managed Care)

## 2023-12-05 ENCOUNTER — Inpatient Hospital Stay (HOSPITAL_BASED_OUTPATIENT_CLINIC_OR_DEPARTMENT_OTHER): Payer: Medicare (Managed Care) | Admitting: Nurse Practitioner

## 2023-12-05 ENCOUNTER — Encounter: Payer: Self-pay | Admitting: Nurse Practitioner

## 2023-12-05 VITALS — BP 127/69 | HR 82 | Temp 98.2°F | Resp 18

## 2023-12-05 VITALS — BP 114/79 | HR 84 | Temp 97.6°F | Wt 244.9 lb

## 2023-12-05 DIAGNOSIS — C8300 Small cell B-cell lymphoma, unspecified site: Secondary | ICD-10-CM | POA: Diagnosis not present

## 2023-12-05 DIAGNOSIS — Z5112 Encounter for antineoplastic immunotherapy: Secondary | ICD-10-CM | POA: Diagnosis not present

## 2023-12-05 LAB — CBC WITH DIFFERENTIAL (CANCER CENTER ONLY)
Abs Immature Granulocytes: 0.02 10*3/uL (ref 0.00–0.07)
Basophils Absolute: 0 10*3/uL (ref 0.0–0.1)
Basophils Relative: 0 %
Eosinophils Absolute: 0.1 10*3/uL (ref 0.0–0.5)
Eosinophils Relative: 2 %
HCT: 46.5 % (ref 39.0–52.0)
Hemoglobin: 14.7 g/dL (ref 13.0–17.0)
Immature Granulocytes: 1 %
Lymphocytes Relative: 45 %
Lymphs Abs: 1.4 10*3/uL (ref 0.7–4.0)
MCH: 23.7 pg — ABNORMAL LOW (ref 26.0–34.0)
MCHC: 31.6 g/dL (ref 30.0–36.0)
MCV: 75 fL — ABNORMAL LOW (ref 80.0–100.0)
Monocytes Absolute: 0.8 10*3/uL (ref 0.1–1.0)
Monocytes Relative: 25 %
Neutro Abs: 0.8 10*3/uL — ABNORMAL LOW (ref 1.7–7.7)
Neutrophils Relative %: 27 %
Platelet Count: 162 10*3/uL (ref 150–400)
RBC: 6.2 MIL/uL — ABNORMAL HIGH (ref 4.22–5.81)
RDW: 18.5 % — ABNORMAL HIGH (ref 11.5–15.5)
WBC Count: 3.1 10*3/uL — ABNORMAL LOW (ref 4.0–10.5)
nRBC: 0 % (ref 0.0–0.2)

## 2023-12-05 LAB — CMP (CANCER CENTER ONLY)
ALT: 29 U/L (ref 0–44)
AST: 29 U/L (ref 15–41)
Albumin: 4.4 g/dL (ref 3.5–5.0)
Alkaline Phosphatase: 82 U/L (ref 38–126)
Anion gap: 11 (ref 5–15)
BUN: 15 mg/dL (ref 8–23)
CO2: 24 mmol/L (ref 22–32)
Calcium: 9 mg/dL (ref 8.9–10.3)
Chloride: 105 mmol/L (ref 98–111)
Creatinine: 0.97 mg/dL (ref 0.61–1.24)
GFR, Estimated: >60 mL/min (ref >60)
Glucose, Bld: 167 mg/dL — ABNORMAL HIGH (ref 70–99)
Potassium: 4.1 mmol/L (ref 3.5–5.1)
Sodium: 140 mmol/L (ref 135–145)
Total Bilirubin: 0.5 mg/dL (ref 0.0–1.2)
Total Protein: 6.9 g/dL (ref 6.5–8.1)

## 2023-12-05 LAB — HEPATITIS B SURFACE ANTIGEN: Hepatitis B Surface Ag: NONREACTIVE

## 2023-12-05 MED ORDER — ACETAMINOPHEN 325 MG PO TABS
650.0000 mg | ORAL_TABLET | Freq: Once | ORAL | Status: AC
  Administered 2023-12-05: 650 mg via ORAL
  Filled 2023-12-05: qty 2

## 2023-12-05 MED ORDER — DIPHENHYDRAMINE HCL 50 MG/ML IJ SOLN
50.0000 mg | Freq: Once | INTRAMUSCULAR | Status: AC
Start: 2023-12-05 — End: 2023-12-05
  Administered 2023-12-05: 50 mg via INTRAVENOUS
  Filled 2023-12-05: qty 1

## 2023-12-05 MED ORDER — SODIUM CHLORIDE 0.9 % IV SOLN: INTRAVENOUS | Status: DC

## 2023-12-05 MED ORDER — SODIUM CHLORIDE 0.9 % IV SOLN
20.0000 mg | Freq: Once | INTRAVENOUS | Status: AC
  Administered 2023-12-05: 20 mg via INTRAVENOUS
  Filled 2023-12-05: qty 2

## 2023-12-05 MED ORDER — OBINUTUZUMAB CHEMO INJECTION 1000 MG/40ML
1000.0000 mg | Freq: Once | INTRAVENOUS | Status: AC
  Administered 2023-12-05: 1000 mg via INTRAVENOUS
  Filled 2023-12-05: qty 40

## 2023-12-05 NOTE — Progress Notes (Signed)
 Patient seen by Olam Ned NP today  Vitals are within treatment parameters:Yes   Labs are within treatment parameters: No (Please specify and give further instructions.) Neut 0.8 WBC 3.1. it's okay to treat Treatment plan has been signed: Yes   Per physician team, Patient is ready for treatment and there are NO modifications to the treatment plan.

## 2023-12-05 NOTE — Patient Instructions (Signed)
 [facility] - A DEPT OF [facility]   Discharge Instructions: Thank you for choosing [facility] to provide your oncology and hematology care.   If you have a lab appointment with the Cancer Center, please go directly to the Cancer Center and check in at the registration area.   Wear comfortable clothing and clothing appropriate for easy access to any Portacath or PICC line.   We strive to give you quality time with your provider. You may need to reschedule your appointment if you arrive late (15 or more minutes).  Arriving late affects you and other patients whose appointments are after yours.  Also, if you miss three or more appointments without notifying the office, you may be dismissed from the clinic at the provider's discretion.      For prescription refill requests, have your pharmacy contact our office and allow 72 hours for refills to be completed.    Today you received the following chemotherapy and/or immunotherapy agents Gazyva       To help prevent nausea and vomiting after your treatment, we encourage you to take your nausea medication as directed.  BELOW ARE SYMPTOMS THAT SHOULD BE REPORTED IMMEDIATELY: *FEVER GREATER THAN 100.4 F (38 C) OR HIGHER *CHILLS OR SWEATING *NAUSEA AND VOMITING THAT IS NOT CONTROLLED WITH YOUR NAUSEA MEDICATION *UNUSUAL SHORTNESS OF BREATH *UNUSUAL BRUISING OR BLEEDING *URINARY PROBLEMS (pain or burning when urinating, or frequent urination) *BOWEL PROBLEMS (unusual diarrhea, constipation, pain near the anus) TENDERNESS IN MOUTH AND THROAT WITH OR WITHOUT PRESENCE OF ULCERS (sore throat, sores in mouth, or a toothache) UNUSUAL RASH, SWELLING OR PAIN  UNUSUAL VAGINAL DISCHARGE OR ITCHING   Items with * indicate a potential emergency and should be followed up as soon as possible or go to the Emergency Department if any problems should occur.  Please show the CHEMOTHERAPY ALERT CARD or IMMUNOTHERAPY  ALERT CARD at check-in to the Emergency Department and triage nurse.  Should you have questions after your visit or need to cancel or reschedule your appointment, please contact [facility] - A DEPT OF [facility]  Dept: [phone]  and follow the prompts.  Office hours are 8:00 a.m. to 4:30 p.m. Monday - Friday. Please note that voicemails left after 4:00 p.m. may not be returned until the following business day.  We are closed weekends and major holidays. You have access to a nurse at all times for urgent questions. Please call the main number to the clinic Dept: [phone] and follow the prompts.   For any non-urgent questions, you may also contact your provider using MyChart. We now offer e-Visits for anyone 22 and older to request care online for non-urgent symptoms. For details visit [email or web address].   Also download the MyChart app! Go to the app store, search "MyChart", open the app, select [place], and log in with your MyChart username and password.  Obinutuzumab Injection What is this medication? OBINUTUZUMAB (OH bi nue TOOZ ue mab) treats leukemia and lymphoma. It works by blocking a protein that causes cancer cells to grow and multiply. This helps to slow or stop the spread of cancer cells. It is a monoclonal antibody. This medicine may be used for other purposes; ask your health care provider or pharmacist if you have questions. COMMON BRAND NAME(S): GAZYVA What should I tell my care team before I take this medication? They need to know if you have any of these conditions: Heart  disease Infection, especially a viral infection, such as hepatitis B Lung or breathing disease Take medications that treat or prevent blood clots An unusual or allergic reaction to obinutuzumab, other medications, foods, dyes, or preservatives Pregnant or trying to get pregnant Breastfeeding How should I use this medication? This medication is for infusion  into a vein. It is given by a care team in a hospital or clinic setting. Talk to your care team about the use of this medication in children. Special care may be needed. Overdosage: If you think you have taken too much of this medicine contact a poison control center or emergency room at once. NOTE: This medicine is only for you. Do not share this medicine with others. What if I miss a dose? Keep appointments for follow-up doses as directed. It is important not to miss your dose. Call your care team if you are unable to keep an appointment. What may interact with this medication? Live virus vaccines This list may not describe all possible interactions. Give your health care provider a list of all the medicines, herbs, non-prescription drugs, or dietary supplements you use. Also tell them if you smoke, drink alcohol, or use illegal drugs. Some items may interact with your medicine. What should I watch for while using this medication? Report any side effects that you notice during your treatment right away, such as changes in your breathing, fever, chills, dizziness or lightheadedness. These effects are more common with the first dose. Visit your care team for checks on your progress. You will need to have regular blood work. Report any other side effects. The side effects of this medication can continue after you finish your treatment. Continue your course of treatment even though you feel ill unless your care team tells you to stop. Call your care team for advice if you get a fever, chills or sore throat, or other symptoms of a cold or flu. Do not treat yourself. This medication decreases your body's ability to fight infections. Try to avoid being around people who are sick. This medication may increase your risk to bruise or bleed. Call your care team if you notice any unusual bleeding. Do not become pregnant while taking this medication or for 6 months after stopping it. Inform your care team if you  wish to become pregnant or think you might be pregnant. There is a potential for serious side effects to an unborn child. Talk to your care team or pharmacist for more information. Do not breast-feed an infant while taking this medication or for 6 months after stopping it. What side effects may I notice from receiving this medication? Side effects that you should report to your care team as soon as possible: Allergic reactions--skin rash, itching, hives, swelling of the face, lips, tongue, or throat Bleeding--bloody or black, tar-like stools, vomiting blood or brown material that looks like coffee grounds, red or dark brown urine, small red or purple spots on skin, unusual bruising or bleeding Blood clot--pain, swelling, or warmth in the leg, shortness of breath, chest pain Dizziness, loss of balance or coordination, confusion or trouble speaking Infection--fever, chills, cough, sore throat, wounds that don't heal, pain or trouble when passing urine, general feeling of discomfort or being unwell Infusion reactions--chest pain, shortness of breath or trouble breathing, feeling faint or lightheaded Liver injury--right upper belly pain, loss of appetite, nausea, light-colored stool, dark yellow or brown urine, yellowing skin or eyes, unusual weakness or fatigue Tumor lysis syndrome (TLS)--nausea, vomiting, diarrhea, decrease in the  amount of urine, dark urine, unusual weakness or fatigue, confusion, muscle pain or cramps, fast or irregular heartbeat, joint pain Side effects that usually do not require medical attention (report to your care team if they continue or are bothersome): Bone, joint, or muscle pain Constipation Diarrhea Fatigue Runny or stuffy nose Sore throat This list may not describe all possible side effects. Call your doctor for medical advice about side effects. You may report side effects to FDA at 1-800-FDA-1088. Where should I keep my medication? This medication is only given in a  hospital or clinic and will not be stored at home. NOTE: This sheet is a summary. It may not cover all possible information. If you have questions about this medicine, talk to your doctor, pharmacist, or health care provider.  2024 Elsevier/Gold Standard (2022-04-06 00:00:00)

## 2023-12-05 NOTE — Progress Notes (Addendum)
 [facility] OFFICE PROGRESS NOTE   Diagnosis: CLL  INTERVAL HISTORY:   Angel [name] returns as scheduled.  He completed cycle 2 obinutuzumab  11/07/2023.  He continues venetoclax  100 mg daily.  No fevers or sweats.  Lymph nodes are smaller.  He has a good appetite.  He denies pain.  No nausea or vomiting.  No diarrhea.  No rash.  No shortness of breath or cough.  Objective:  Vital signs in last 24 hours:  Blood pressure 114/79, pulse 84, temperature 97.6 F (36.4 C), temperature source Temporal, weight 244 lb 14.4 oz (111.1 kg), SpO2 99%.    HEENT: No thrush or ulcers. Lymphatics: No palpable cervical, supraclavicular or inguinal lymph nodes.  Approximate 2 cm mobile left axillary lymph node. Resp: Lungs clear bilaterally. Cardio: Regular rate and rhythm. GI: No hepatosplenomegaly. Vascular: No leg edema. Skin: No rash.   Lab Results:  Lab Results  Component Value Date   WBC 3.1 (L) 12/05/2023   HGB 14.7 12/05/2023   HCT 46.5 12/05/2023   MCV 75.0 (L) 12/05/2023   PLT 162 12/05/2023   NEUTROABS 0.8 (L) 12/05/2023    Imaging:  No results found.  Medications: I have reviewed the patient's current medications.  Assessment/Plan: Colon cancer, sigmoid, stage IIIa (T1,N1), status post a partial colectomy 02/02/2017 1/26 lymph nodes positive MSI-stable, no loss of mismatch repair protein expression Staging CTs 12/29/2016-small indeterminate left lung nodules Normal preoperative CEA Cycle 1 CAPOX 03/03/2017 Cycle 2 CAPOX 03/24/2017 Cycle 3 CAPOX 04/14/2017 Cycle 4 CAPOX 05/05/2017 CTs 11/06/2018- negative for recurrent disease, small left lung nodule stable from 2007-consider benign Colonoscopy 05/29/2019-negative CTs 10/25/2019-mild increase in a 1 cm small bowel mesenteric lymph node, no evidence of recurrent disease CTs 10/20/2020-several small left-sided lung nodule stable when compared to prior CTs.  No evidence of metastatic disease in the abdomen  or pelvis.  Diffuse fatty infiltration of the liver.     2.  Indeterminate left lung nodules on a chest CT 12/29/2016, stable lung nodules on CT 10/25/2019   3.   Chronic red cell microcytosis   4.   Diabetes   5.   Hypertension   6.   Family history of colon cancer   7.   Multiple polyps noted on the colonoscopy 12/19/2016   8.   Neutropenia secondary to chemotherapy   9.   Extensive peripheral adenopathy on exam 08/21/2023-CTs neck through pelvis 08/24/2023 with extensive lymphadenopathy neck, chest, abdomen and pelvis.  Scattered tiny lung nodule stable from prior examination and compatible with a benign finding. 09/08/2023-right axillary lymph node biopsy-necrotic lymph node 09/22/2023-left axillary lymph node biopsy-75% of lymphocytes with CD5/CD200 positive lambda restricted on flow cytometry, surgical otology with a CD5 positive B-cell lymphoma, CD20, PAX5, cyclin D1 negative, perforation right 5-10%, CLL/SLL favored Cycle 1 day 1 obinutuzumab  10/03/2023, day 2 10/04/2023, day 8 10/09/2023, day 15 10/16/2023 Venetoclax  10/29/2023, ramp-up schedule Obinutuzumab  and venetoclax  placed on hold 10/31/2023 Venetoclax  resumed 11/03/2023 Cycle 2 obinutuzumab  11/07/2023 Venetoclax  dose escalated to 100 mg daily 11/26/2023 Cycle 3 obinutuzumab  12/05/2023, venetoclax  continued 100 mg daily   10.  Positive hepatitis B core antibody and negative surface antigen 09/28/2023-repeat labs in 4 weeks. 11.  Herpetic lip lesions 10/09/2023-Valtrex   12.  Admission 11/02/2023 with somnolence  Disposition: Mr. Sleight appears stable.  He has completed 2 cycles of obinutuzumab .  He continues venetoclax  100 mg daily.  Peripheral adenopathy significantly improved.  Plan to proceed with cycle 3 obinutuzumab  today as scheduled.  He will continue  venetoclax  at the current dose of 100 mg daily.  CBC and chemistry panel reviewed.  Labs adequate to proceed as above.  He has persistent stable mild neutropenia.  He  understands to contact the office with fever, chills, other signs of infection.  He will return for a follow-up CBC in 1 week.  Plan for lab and office visit in 2 weeks.  We are available to see him sooner if needed.   Olam Ned ANP/GNP-BC   12/05/2023  9:49 AM

## 2023-12-06 ENCOUNTER — Encounter: Payer: Self-pay | Admitting: Oncology

## 2023-12-12 ENCOUNTER — Telehealth: Payer: Self-pay

## 2023-12-12 ENCOUNTER — Other Ambulatory Visit: Payer: Self-pay

## 2023-12-12 ENCOUNTER — Inpatient Hospital Stay: Payer: Medicare (Managed Care)

## 2023-12-12 DIAGNOSIS — C8300 Small cell B-cell lymphoma, unspecified site: Secondary | ICD-10-CM

## 2023-12-12 DIAGNOSIS — Z5112 Encounter for antineoplastic immunotherapy: Secondary | ICD-10-CM | POA: Diagnosis not present

## 2023-12-12 LAB — CBC WITH DIFFERENTIAL (CANCER CENTER ONLY)
Abs Immature Granulocytes: 0.11 10*3/uL — ABNORMAL HIGH (ref 0.00–0.07)
Basophils Absolute: 0 10*3/uL (ref 0.0–0.1)
Basophils Relative: 1 %
Eosinophils Absolute: 0.1 10*3/uL (ref 0.0–0.5)
Eosinophils Relative: 2 %
HCT: 48.7 % (ref 39.0–52.0)
Hemoglobin: 15.5 g/dL (ref 13.0–17.0)
Immature Granulocytes: 3 %
Lymphocytes Relative: 41 %
Lymphs Abs: 1.7 10*3/uL (ref 0.7–4.0)
MCH: 24 pg — ABNORMAL LOW (ref 26.0–34.0)
MCHC: 31.8 g/dL (ref 30.0–36.0)
MCV: 75.5 fL — ABNORMAL LOW (ref 80.0–100.0)
Monocytes Absolute: 0.8 10*3/uL (ref 0.1–1.0)
Monocytes Relative: 20 %
Neutro Abs: 1.3 10*3/uL — ABNORMAL LOW (ref 1.7–7.7)
Neutrophils Relative %: 33 %
Platelet Count: 142 10*3/uL — ABNORMAL LOW (ref 150–400)
RBC: 6.45 MIL/uL — ABNORMAL HIGH (ref 4.22–5.81)
RDW: 18.7 % — ABNORMAL HIGH (ref 11.5–15.5)
WBC Count: 4 10*3/uL (ref 4.0–10.5)
nRBC: 0 % (ref 0.0–0.2)

## 2023-12-12 NOTE — Progress Notes (Signed)
 Specialty Pharmacy Refill Coordination Note  Angel [name] is a 73 y.o. male contacted today regarding refills of specialty medication(s) Venetoclax  (VENCLEXTA )   Patient requested Delivery   Delivery date: 12/20/23   Verified address: Patient address [address]  [place] [address]   Medication will be filled on 01.21.25.

## 2023-12-12 NOTE — Telephone Encounter (Signed)
-----   Message from Lonna Cobb sent at 12/12/2023  3:13 PM EST ----- Please let him know the white count better.  Call with fever, chills, other signs of infection.  Follow-up as scheduled.

## 2023-12-12 NOTE — Telephone Encounter (Signed)
 Patient gave verbal understanding and had no further questions or concerns

## 2023-12-13 ENCOUNTER — Other Ambulatory Visit: Payer: Self-pay

## 2023-12-15 ENCOUNTER — Telehealth: Payer: Self-pay

## 2023-12-19 ENCOUNTER — Other Ambulatory Visit: Payer: Self-pay

## 2023-12-20 ENCOUNTER — Encounter: Payer: Self-pay | Admitting: Nurse Practitioner

## 2023-12-20 ENCOUNTER — Inpatient Hospital Stay: Payer: Medicare (Managed Care)

## 2023-12-20 ENCOUNTER — Inpatient Hospital Stay (HOSPITAL_BASED_OUTPATIENT_CLINIC_OR_DEPARTMENT_OTHER): Payer: Medicare (Managed Care) | Admitting: Nurse Practitioner

## 2023-12-20 VITALS — BP 127/77 | HR 91 | Temp 98.2°F | Resp 18 | Ht 70.0 in | Wt 248.2 lb

## 2023-12-20 DIAGNOSIS — Z5112 Encounter for antineoplastic immunotherapy: Secondary | ICD-10-CM | POA: Diagnosis not present

## 2023-12-20 DIAGNOSIS — C8300 Small cell B-cell lymphoma, unspecified site: Secondary | ICD-10-CM

## 2023-12-20 LAB — CBC WITH DIFFERENTIAL (CANCER CENTER ONLY)
Abs Immature Granulocytes: 0.11 10*3/uL — ABNORMAL HIGH (ref 0.00–0.07)
Basophils Absolute: 0 10*3/uL (ref 0.0–0.1)
Basophils Relative: 1 %
Eosinophils Absolute: 0.1 10*3/uL (ref 0.0–0.5)
Eosinophils Relative: 3 %
HCT: 50.8 % (ref 39.0–52.0)
Hemoglobin: 15.8 g/dL (ref 13.0–17.0)
Immature Granulocytes: 3 %
Lymphocytes Relative: 41 %
Lymphs Abs: 1.6 10*3/uL (ref 0.7–4.0)
MCH: 23.9 pg — ABNORMAL LOW (ref 26.0–34.0)
MCHC: 31.1 g/dL (ref 30.0–36.0)
MCV: 77 fL — ABNORMAL LOW (ref 80.0–100.0)
Monocytes Absolute: 0.6 10*3/uL (ref 0.1–1.0)
Monocytes Relative: 17 %
Neutro Abs: 1.3 10*3/uL — ABNORMAL LOW (ref 1.7–7.7)
Neutrophils Relative %: 35 %
Platelet Count: 138 10*3/uL — ABNORMAL LOW (ref 150–400)
RBC: 6.6 MIL/uL — ABNORMAL HIGH (ref 4.22–5.81)
RDW: 18.9 % — ABNORMAL HIGH (ref 11.5–15.5)
WBC Count: 3.8 10*3/uL — ABNORMAL LOW (ref 4.0–10.5)
nRBC: 0 % (ref 0.0–0.2)

## 2023-12-20 NOTE — Progress Notes (Signed)
[facility] OFFICE PROGRESS NOTE   Diagnosis: CLL  INTERVAL HISTORY:   Angel [name] returns as scheduled.  He continues venetoclax.  He completed cycle 3 obinutuzumab 12/05/2023.  He denies signs of a reaction.  He denies nausea/vomiting.  No mouth sores.  No diarrhea.  No cough or shortness of breath.  Lymph nodes are smaller.  Good appetite and good energy level.  Objective:  Vital signs in last 24 hours:  Blood pressure 127/77, pulse 91, temperature 98.2 F (36.8 C), temperature source Temporal, resp. rate 18, height 5\' 10"  (1.778 m), weight 248 lb 3.2 oz (112.6 kg), SpO2 98%.    HEENT: No thrush or ulcers. Lymphatics: No palpable cervical, supraclavicular, left axillary or inguinal lymph nodes.  Possible small mobile right axillary node. Resp: Lungs clear bilaterally. Cardio: Regular rate and rhythm. GI: No hepatosplenomegaly. Vascular: No leg edema. Neuro: Alert and oriented. Skin: Small area of hypopigmented skin just inferior to the right nasolabial fold.   Lab Results:  Lab Results  Component Value Date   WBC 3.8 (L) 12/20/2023   HGB 15.8 12/20/2023   HCT 50.8 12/20/2023   MCV 77.0 (L) 12/20/2023   PLT 138 (L) 12/20/2023   NEUTROABS 1.3 (L) 12/20/2023    Imaging:  No results found.  Medications: I have reviewed the patient's current medications.  Assessment/Plan: Colon cancer, sigmoid, stage IIIa (T1,N1), status post a partial colectomy 02/02/2017 1/26 lymph nodes positive MSI-stable, no loss of mismatch repair protein expression Staging CTs 12/29/2016-small indeterminate left lung nodules Normal preoperative CEA Cycle 1 CAPOX 03/03/2017 Cycle 2 CAPOX 03/24/2017 Cycle 3 CAPOX 04/14/2017 Cycle 4 CAPOX 05/05/2017 CTs 11/06/2018- negative for recurrent disease, small left lung nodule stable from 2007-consider benign Colonoscopy 05/29/2019-negative CTs 10/25/2019-mild increase in a 1 cm small bowel mesenteric lymph node, no evidence of  recurrent disease CTs 10/20/2020-several small left-sided lung nodule stable when compared to prior CTs.  No evidence of metastatic disease in the abdomen or pelvis.  Diffuse fatty infiltration of the liver.     2.  Indeterminate left lung nodules on a chest CT 12/29/2016, stable lung nodules on CT 10/25/2019   3.   Chronic red cell microcytosis   4.   Diabetes   5.   Hypertension   6.   Family history of colon cancer   7.   Multiple polyps noted on the colonoscopy 12/19/2016   8.   Neutropenia secondary to chemotherapy   9.   Extensive peripheral adenopathy on exam 08/21/2023-CTs neck through pelvis 08/24/2023 with extensive lymphadenopathy neck, chest, abdomen and pelvis.  Scattered tiny lung nodule stable from prior examination and compatible with a benign finding. 09/08/2023-right axillary lymph node biopsy-necrotic lymph node 09/22/2023-left axillary lymph node biopsy-75% of lymphocytes with CD5/CD200 positive lambda restricted on flow cytometry, surgical otology with a CD5 positive B-cell lymphoma, CD20, PAX5, cyclin D1 negative, perforation right 5-10%, CLL/SLL favored Cycle 1 day 1 obinutuzumab 10/03/2023, day 2 10/04/2023, day 8 10/09/2023, day 15 10/16/2023 Venetoclax 10/29/2023, ramp-up schedule Obinutuzumab and venetoclax placed on hold 10/31/2023 Venetoclax resumed 11/03/2023 Cycle 2 obinutuzumab 11/07/2023 Venetoclax dose escalated to 100 mg daily 11/26/2023 Cycle 3 obinutuzumab 12/05/2023, venetoclax continued 100 mg daily   10.  Positive hepatitis B core antibody and negative surface antigen 09/28/2023-repeat labs in 4 weeks. 11.  Herpetic lip lesions 10/09/2023-Valtrex  12.  Admission 11/02/2023 with somnolence    Disposition: Angel [name] appears stable.  He has completed 3 cycles of obinutuzumab.  He continues venetoclax 100 mg  daily.  He is tolerating treatment well.  There has been significant improvement in peripheral adenopathy.  He will continue venetoclax at the  current dose and return for an office visit and the next cycle of obinutuzumab in 2 weeks.  We are available to see him sooner if needed.   Lonna Cobb ANP/GNP-BC   12/20/2023  11:22 AM

## 2023-12-26 ENCOUNTER — Other Ambulatory Visit: Payer: Self-pay | Admitting: Oncology

## 2023-12-26 DIAGNOSIS — C8300 Small cell B-cell lymphoma, unspecified site: Secondary | ICD-10-CM

## 2023-12-31 ENCOUNTER — Other Ambulatory Visit: Payer: Self-pay | Admitting: Oncology

## 2024-01-02 ENCOUNTER — Inpatient Hospital Stay: Payer: Medicare (Managed Care) | Attending: Oncology

## 2024-01-02 ENCOUNTER — Inpatient Hospital Stay (HOSPITAL_BASED_OUTPATIENT_CLINIC_OR_DEPARTMENT_OTHER): Payer: 59 | Admitting: Oncology

## 2024-01-02 ENCOUNTER — Inpatient Hospital Stay: Payer: 59

## 2024-01-02 VITALS — BP 122/69 | HR 88 | Temp 98.2°F | Resp 18

## 2024-01-02 VITALS — BP 130/79 | HR 74 | Temp 98.1°F | Resp 18 | Ht 70.0 in | Wt 250.0 lb

## 2024-01-02 DIAGNOSIS — I10 Essential (primary) hypertension: Secondary | ICD-10-CM | POA: Insufficient documentation

## 2024-01-02 DIAGNOSIS — C8514 Unspecified B-cell lymphoma, lymph nodes of axilla and upper limb: Secondary | ICD-10-CM | POA: Diagnosis present

## 2024-01-02 DIAGNOSIS — Z85038 Personal history of other malignant neoplasm of large intestine: Secondary | ICD-10-CM | POA: Insufficient documentation

## 2024-01-02 DIAGNOSIS — C8300 Small cell B-cell lymphoma, unspecified site: Secondary | ICD-10-CM

## 2024-01-02 DIAGNOSIS — D701 Agranulocytosis secondary to cancer chemotherapy: Secondary | ICD-10-CM | POA: Insufficient documentation

## 2024-01-02 DIAGNOSIS — T451X5A Adverse effect of antineoplastic and immunosuppressive drugs, initial encounter: Secondary | ICD-10-CM | POA: Diagnosis not present

## 2024-01-02 DIAGNOSIS — Z5112 Encounter for antineoplastic immunotherapy: Secondary | ICD-10-CM | POA: Diagnosis present

## 2024-01-02 DIAGNOSIS — Z79899 Other long term (current) drug therapy: Secondary | ICD-10-CM | POA: Insufficient documentation

## 2024-01-02 DIAGNOSIS — Z800 Family history of malignant neoplasm of digestive organs: Secondary | ICD-10-CM | POA: Diagnosis not present

## 2024-01-02 DIAGNOSIS — Z7969 Long term (current) use of other immunomodulators and immunosuppressants: Secondary | ICD-10-CM | POA: Diagnosis not present

## 2024-01-02 DIAGNOSIS — C9110 Chronic lymphocytic leukemia of B-cell type not having achieved remission: Secondary | ICD-10-CM | POA: Insufficient documentation

## 2024-01-02 DIAGNOSIS — Z79620 Long term (current) use of immunosuppressive biologic: Secondary | ICD-10-CM | POA: Insufficient documentation

## 2024-01-02 DIAGNOSIS — E119 Type 2 diabetes mellitus without complications: Secondary | ICD-10-CM | POA: Insufficient documentation

## 2024-01-02 LAB — CMP (CANCER CENTER ONLY)
ALT: 38 U/L (ref 0–44)
AST: 34 U/L (ref 15–41)
Albumin: 4.3 g/dL (ref 3.5–5.0)
Alkaline Phosphatase: 86 U/L (ref 38–126)
Anion gap: 8 (ref 5–15)
BUN: 16 mg/dL (ref 8–23)
CO2: 28 mmol/L (ref 22–32)
Calcium: 9.6 mg/dL (ref 8.9–10.3)
Chloride: 102 mmol/L (ref 98–111)
Creatinine: 1.1 mg/dL (ref 0.61–1.24)
GFR, Estimated: >60 mL/min (ref >60)
Glucose, Bld: 126 mg/dL — ABNORMAL HIGH (ref 70–99)
Potassium: 4.5 mmol/L (ref 3.5–5.1)
Sodium: 138 mmol/L (ref 135–145)
Total Bilirubin: 0.5 mg/dL (ref 0.0–1.2)
Total Protein: 7.2 g/dL (ref 6.5–8.1)

## 2024-01-02 LAB — LACTATE DEHYDROGENASE: LDH: 182 U/L (ref 98–192)

## 2024-01-02 LAB — CBC WITH DIFFERENTIAL (CANCER CENTER ONLY)
Abs Immature Granulocytes: 0.2 10*3/uL — ABNORMAL HIGH (ref 0.00–0.07)
Basophils Absolute: 0 10*3/uL (ref 0.0–0.1)
Basophils Relative: 0 %
Eosinophils Absolute: 0.1 10*3/uL (ref 0.0–0.5)
Eosinophils Relative: 2 %
HCT: 50.2 % (ref 39.0–52.0)
Hemoglobin: 15.6 g/dL (ref 13.0–17.0)
Immature Granulocytes: 5 %
Lymphocytes Relative: 42 %
Lymphs Abs: 1.7 10*3/uL (ref 0.7–4.0)
MCH: 23.7 pg — ABNORMAL LOW (ref 26.0–34.0)
MCHC: 31.1 g/dL (ref 30.0–36.0)
MCV: 76.4 fL — ABNORMAL LOW (ref 80.0–100.0)
Monocytes Absolute: 0.7 10*3/uL (ref 0.1–1.0)
Monocytes Relative: 18 %
Neutro Abs: 1.3 10*3/uL — ABNORMAL LOW (ref 1.7–7.7)
Neutrophils Relative %: 33 %
Platelet Count: 153 10*3/uL (ref 150–400)
RBC: 6.57 MIL/uL — ABNORMAL HIGH (ref 4.22–5.81)
RDW: 18.4 % — ABNORMAL HIGH (ref 11.5–15.5)
WBC Count: 4 10*3/uL (ref 4.0–10.5)
nRBC: 0 % (ref 0.0–0.2)

## 2024-01-02 MED ORDER — SODIUM CHLORIDE 0.9 % IV SOLN
20.0000 mg | Freq: Once | INTRAVENOUS | Status: AC
  Administered 2024-01-02: 20 mg via INTRAVENOUS
  Filled 2024-01-02: qty 20

## 2024-01-02 MED ORDER — SODIUM CHLORIDE 0.9 % IV SOLN
INTRAVENOUS | Status: DC
Start: 2024-01-02 — End: 2024-01-02

## 2024-01-02 MED ORDER — SODIUM CHLORIDE 0.9 % IV SOLN
1000.0000 mg | Freq: Once | INTRAVENOUS | Status: AC
  Administered 2024-01-02: 1000 mg via INTRAVENOUS
  Filled 2024-01-02: qty 40

## 2024-01-02 MED ORDER — DIPHENHYDRAMINE HCL 50 MG/ML IJ SOLN
50.0000 mg | Freq: Once | INTRAMUSCULAR | Status: AC
  Administered 2024-01-02: 50 mg via INTRAVENOUS
  Filled 2024-01-02: qty 1

## 2024-01-02 MED ORDER — ACETAMINOPHEN 325 MG PO TABS
650.0000 mg | ORAL_TABLET | Freq: Once | ORAL | Status: AC
  Administered 2024-01-02: 650 mg via ORAL
  Filled 2024-01-02: qty 2

## 2024-01-02 NOTE — Progress Notes (Signed)
 [facility] OFFICE PROGRESS NOTE   Diagnosis: CLL  INTERVAL HISTORY:   Angel [name] completed another treatment with obinutuzumab  on 12/05/2023.  He continues venetoclax .  No rash, diarrhea, fever, night sweats, or infection.  The palpable lymph nodes are smaller.  Good appetite.  Objective:  Vital signs in last 24 hours:  Blood pressure 130/79, pulse 74, temperature 98.1 F (36.7 C), temperature source Temporal, resp. rate 18, height 5' 10 (1.778 m), weight 250 lb (113.4 kg), SpO2 99%.    HEENT: No thrush or ulcers Lymphatics:, Supraclavicular, or inguinal nodes.  Prominent right greater than left axillary fat pads versus 2 cm soft mobile nodes Resp: Lungs clear bilaterally Cardio: Regular rate and rhythm GI: No hepatosplenomegaly Vascular: No leg edema    Lab Results:  Lab Results  Component Value Date   WBC 4.0 01/02/2024   HGB 15.6 01/02/2024   HCT 50.2 01/02/2024   MCV 76.4 (L) 01/02/2024   PLT 153 01/02/2024   NEUTROABS 1.3 (L) 01/02/2024    CMP  Lab Results  Component Value Date   NA 138 01/02/2024   K 4.5 01/02/2024   CL 102 01/02/2024   CO2 28 01/02/2024   GLUCOSE 126 (H) 01/02/2024   BUN 16 01/02/2024   CREATININE 1.10 01/02/2024   CALCIUM  9.6 01/02/2024   PROT 7.2 01/02/2024   ALBUMIN 4.3 01/02/2024   AST 34 01/02/2024   ALT 38 01/02/2024   ALKPHOS 86 01/02/2024   BILITOT 0.5 01/02/2024   GFRNONAA >60 01/02/2024   GFRAA >60 05/08/2020    Lab Results  Component Value Date   CEA1 1.52 10/19/2020   CEA 2.09 08/21/2023    No results found for: INR, LABPROT  Imaging:  No results found.  Medications: I have reviewed the patient's current medications.   Colon cancer, sigmoid, stage IIIa (T1,N1), status post a partial colectomy 02/02/2017 1/26 lymph nodes positive MSI-stable, no loss of mismatch repair protein expression Staging CTs 12/29/2016-small indeterminate left lung nodules Normal preoperative CEA Cycle 1  CAPOX 03/03/2017 Cycle 2 CAPOX 03/24/2017 Cycle 3 CAPOX 04/14/2017 Cycle 4 CAPOX 05/05/2017 CTs 11/06/2018- negative for recurrent disease, small left lung nodule stable from 2007-consider benign Colonoscopy 05/29/2019-negative CTs 10/25/2019-mild increase in a 1 cm small bowel mesenteric lymph node, no evidence of recurrent disease CTs 10/20/2020-several small left-sided lung nodule stable when compared to prior CTs.  No evidence of metastatic disease in the abdomen or pelvis.  Diffuse fatty infiltration of the liver.     2.  Indeterminate left lung nodules on a chest CT 12/29/2016, stable lung nodules on CT 10/25/2019   3.   Chronic red cell microcytosis   4.   Diabetes   5.   Hypertension   6.   Family history of colon cancer   7.   Multiple polyps noted on the colonoscopy 12/19/2016   8.   Neutropenia secondary to chemotherapy   9.   Extensive peripheral adenopathy on exam 08/21/2023-CTs neck through pelvis 08/24/2023 with extensive lymphadenopathy neck, chest, abdomen and pelvis.  Scattered tiny lung nodule stable from prior examination and compatible with a benign finding. 09/08/2023-right axillary lymph node biopsy-necrotic lymph node 09/22/2023-left axillary lymph node biopsy-75% of lymphocytes with CD5/CD200 positive lambda restricted on flow cytometry, surgical otology with a CD5 positive B-cell lymphoma, CD20, PAX5, cyclin D1 negative, perforation right 5-10%, CLL/SLL favored Cycle 1 day 1 obinutuzumab  10/03/2023, day 2 10/04/2023, day 8 10/09/2023, day 15 10/16/2023 Venetoclax  10/29/2023, ramp-up schedule Obinutuzumab  and venetoclax  placed on  hold 10/31/2023 Venetoclax  resumed 11/03/2023 Cycle 2 obinutuzumab  11/07/2023 Venetoclax  dose escalated to 100 mg daily 11/26/2023 Cycle 3 obinutuzumab  12/05/2023, venetoclax  continued 100 mg daily Cycle 4 obinutuzumab  01/02/2024, venetoclax  continued 100 mg daily secondary to neutropenia   10.  Positive hepatitis B core antibody and negative  surface antigen 09/28/2023-repeat labs in 4 weeks. 11.  Herpetic lip lesions 10/09/2023-Valtrex   12.  Admission 11/02/2023 with somnolence       Disposition: Angel [name] appears stable.  He has experienced significant clinical improvement with obinutuzumab /venetoclax .  Palpable lymphadenopathy appears to have resolved.  He will complete another treat with obinutuzumab  today.  He continues daily venetoclax .  He will call for a fever or symptoms of infection.  Angel [name] will return for an office and lab visit in 4 weeks.  [name], MD  01/02/2024  9:44 AM

## 2024-01-02 NOTE — Patient Instructions (Signed)
 [facility] - A DEPT OF [facility]   Discharge Instructions: Thank you for choosing [facility] to provide your oncology and hematology care.   If you have a lab appointment with the Cancer Center, please go directly to the Cancer Center and check in at the registration area.   Wear comfortable clothing and clothing appropriate for easy access to any Portacath or PICC line.   We strive to give you quality time with your provider. You may need to reschedule your appointment if you arrive late (15 or more minutes).  Arriving late affects you and other patients whose appointments are after yours.  Also, if you miss three or more appointments without notifying the office, you may be dismissed from the clinic at the provider's discretion.      For prescription refill requests, have your pharmacy contact our office and allow 72 hours for refills to be completed.    Today you received the following chemotherapy and/or immunotherapy agents Gazyva       To help prevent nausea and vomiting after your treatment, we encourage you to take your nausea medication as directed.  BELOW ARE SYMPTOMS THAT SHOULD BE REPORTED IMMEDIATELY: *FEVER GREATER THAN 100.4 F (38 C) OR HIGHER *CHILLS OR SWEATING *NAUSEA AND VOMITING THAT IS NOT CONTROLLED WITH YOUR NAUSEA MEDICATION *UNUSUAL SHORTNESS OF BREATH *UNUSUAL BRUISING OR BLEEDING *URINARY PROBLEMS (pain or burning when urinating, or frequent urination) *BOWEL PROBLEMS (unusual diarrhea, constipation, pain near the anus) TENDERNESS IN MOUTH AND THROAT WITH OR WITHOUT PRESENCE OF ULCERS (sore throat, sores in mouth, or a toothache) UNUSUAL RASH, SWELLING OR PAIN  UNUSUAL VAGINAL DISCHARGE OR ITCHING   Items with * indicate a potential emergency and should be followed up as soon as possible or go to the Emergency Department if any problems should occur.  Please show the CHEMOTHERAPY ALERT CARD or IMMUNOTHERAPY  ALERT CARD at check-in to the Emergency Department and triage nurse.  Should you have questions after your visit or need to cancel or reschedule your appointment, please contact [facility] - A DEPT OF [facility]  Dept: [phone]  and follow the prompts.  Office hours are 8:00 a.m. to 4:30 p.m. Monday - Friday. Please note that voicemails left after 4:00 p.m. may not be returned until the following business day.  We are closed weekends and major holidays. You have access to a nurse at all times for urgent questions. Please call the main number to the clinic Dept: [phone] and follow the prompts.   For any non-urgent questions, you may also contact your provider using MyChart. We now offer e-Visits for anyone 22 and older to request care online for non-urgent symptoms. For details visit [email or web address].   Also download the MyChart app! Go to the app store, search "MyChart", open the app, select [place], and log in with your MyChart username and password.  Obinutuzumab Injection What is this medication? OBINUTUZUMAB (OH bi nue TOOZ ue mab) treats leukemia and lymphoma. It works by blocking a protein that causes cancer cells to grow and multiply. This helps to slow or stop the spread of cancer cells. It is a monoclonal antibody. This medicine may be used for other purposes; ask your health care provider or pharmacist if you have questions. COMMON BRAND NAME(S): GAZYVA What should I tell my care team before I take this medication? They need to know if you have any of these conditions: Heart  disease Infection, especially a viral infection, such as hepatitis B Lung or breathing disease Take medications that treat or prevent blood clots An unusual or allergic reaction to obinutuzumab, other medications, foods, dyes, or preservatives Pregnant or trying to get pregnant Breastfeeding How should I use this medication? This medication is for infusion  into a vein. It is given by a care team in a hospital or clinic setting. Talk to your care team about the use of this medication in children. Special care may be needed. Overdosage: If you think you have taken too much of this medicine contact a poison control center or emergency room at once. NOTE: This medicine is only for you. Do not share this medicine with others. What if I miss a dose? Keep appointments for follow-up doses as directed. It is important not to miss your dose. Call your care team if you are unable to keep an appointment. What may interact with this medication? Live virus vaccines This list may not describe all possible interactions. Give your health care provider a list of all the medicines, herbs, non-prescription drugs, or dietary supplements you use. Also tell them if you smoke, drink alcohol, or use illegal drugs. Some items may interact with your medicine. What should I watch for while using this medication? Report any side effects that you notice during your treatment right away, such as changes in your breathing, fever, chills, dizziness or lightheadedness. These effects are more common with the first dose. Visit your care team for checks on your progress. You will need to have regular blood work. Report any other side effects. The side effects of this medication can continue after you finish your treatment. Continue your course of treatment even though you feel ill unless your care team tells you to stop. Call your care team for advice if you get a fever, chills or sore throat, or other symptoms of a cold or flu. Do not treat yourself. This medication decreases your body's ability to fight infections. Try to avoid being around people who are sick. This medication may increase your risk to bruise or bleed. Call your care team if you notice any unusual bleeding. Do not become pregnant while taking this medication or for 6 months after stopping it. Inform your care team if you  wish to become pregnant or think you might be pregnant. There is a potential for serious side effects to an unborn child. Talk to your care team or pharmacist for more information. Do not breast-feed an infant while taking this medication or for 6 months after stopping it. What side effects may I notice from receiving this medication? Side effects that you should report to your care team as soon as possible: Allergic reactions--skin rash, itching, hives, swelling of the face, lips, tongue, or throat Bleeding--bloody or black, tar-like stools, vomiting blood or brown material that looks like coffee grounds, red or dark brown urine, small red or purple spots on skin, unusual bruising or bleeding Blood clot--pain, swelling, or warmth in the leg, shortness of breath, chest pain Dizziness, loss of balance or coordination, confusion or trouble speaking Infection--fever, chills, cough, sore throat, wounds that don't heal, pain or trouble when passing urine, general feeling of discomfort or being unwell Infusion reactions--chest pain, shortness of breath or trouble breathing, feeling faint or lightheaded Liver injury--right upper belly pain, loss of appetite, nausea, light-colored stool, dark yellow or brown urine, yellowing skin or eyes, unusual weakness or fatigue Tumor lysis syndrome (TLS)--nausea, vomiting, diarrhea, decrease in the  amount of urine, dark urine, unusual weakness or fatigue, confusion, muscle pain or cramps, fast or irregular heartbeat, joint pain Side effects that usually do not require medical attention (report to your care team if they continue or are bothersome): Bone, joint, or muscle pain Constipation Diarrhea Fatigue Runny or stuffy nose Sore throat This list may not describe all possible side effects. Call your doctor for medical advice about side effects. You may report side effects to FDA at 1-800-FDA-1088. Where should I keep my medication? This medication is only given in a  hospital or clinic and will not be stored at home. NOTE: This sheet is a summary. It may not cover all possible information. If you have questions about this medicine, talk to your doctor, pharmacist, or health care provider.  2024 Elsevier/Gold Standard (2022-04-06 00:00:00)

## 2024-01-02 NOTE — Progress Notes (Signed)
 Patient seen by Dr. [name] today  Vitals are within treatment parameters:Yes   Labs are within treatment parameters: No, ANC 1.3, ok per MD Sherrill  Treatment plan has been signed: Yes   Per physician team, Patient is ready for treatment and there are NO modifications to the treatment plan.

## 2024-01-03 ENCOUNTER — Other Ambulatory Visit: Payer: Self-pay

## 2024-01-16 ENCOUNTER — Other Ambulatory Visit: Payer: Self-pay

## 2024-01-16 ENCOUNTER — Telehealth: Payer: Self-pay

## 2024-01-16 NOTE — Telephone Encounter (Signed)
The patient contacted [place] regarding a call he received from [facility] requesting a refill for his Venetoclax. He informed the pharmacy technician that he is currently taking only 100 mg daily and does not require a refill. I subsequently reached out to the pharmacy to confirm that he is only taking 100 mg and that a refill is not necessary. The delivery has been canceled. The pharmacy technician acknowledged this information and had no further questions.

## 2024-01-16 NOTE — Progress Notes (Signed)
Opened in error - per patient he has only been taking 1 tab a day due to low white blood cell count. Patient asked call center to follow up after 3.4.25 appointment.

## 2024-01-18 ENCOUNTER — Other Ambulatory Visit: Payer: Self-pay

## 2024-01-28 ENCOUNTER — Other Ambulatory Visit: Payer: Self-pay | Admitting: Oncology

## 2024-01-30 ENCOUNTER — Encounter: Payer: Self-pay | Admitting: Oncology

## 2024-01-30 ENCOUNTER — Other Ambulatory Visit: Payer: Self-pay

## 2024-01-30 ENCOUNTER — Inpatient Hospital Stay (HOSPITAL_BASED_OUTPATIENT_CLINIC_OR_DEPARTMENT_OTHER): Payer: Medicare (Managed Care) | Admitting: Oncology

## 2024-01-30 ENCOUNTER — Inpatient Hospital Stay: Payer: Medicare (Managed Care)

## 2024-01-30 ENCOUNTER — Inpatient Hospital Stay: Payer: Medicare (Managed Care) | Attending: Oncology

## 2024-01-30 VITALS — BP 132/77 | HR 82 | Temp 98.2°F | Resp 18 | Ht 70.0 in | Wt 251.1 lb

## 2024-01-30 VITALS — BP 149/79 | HR 88 | Temp 97.8°F | Resp 16

## 2024-01-30 DIAGNOSIS — Z79899 Other long term (current) drug therapy: Secondary | ICD-10-CM | POA: Diagnosis not present

## 2024-01-30 DIAGNOSIS — D701 Agranulocytosis secondary to cancer chemotherapy: Secondary | ICD-10-CM | POA: Insufficient documentation

## 2024-01-30 DIAGNOSIS — C8514 Unspecified B-cell lymphoma, lymph nodes of axilla and upper limb: Secondary | ICD-10-CM | POA: Insufficient documentation

## 2024-01-30 DIAGNOSIS — T451X5A Adverse effect of antineoplastic and immunosuppressive drugs, initial encounter: Secondary | ICD-10-CM | POA: Insufficient documentation

## 2024-01-30 DIAGNOSIS — C8300 Small cell B-cell lymphoma, unspecified site: Secondary | ICD-10-CM

## 2024-01-30 DIAGNOSIS — Z800 Family history of malignant neoplasm of digestive organs: Secondary | ICD-10-CM | POA: Insufficient documentation

## 2024-01-30 DIAGNOSIS — K760 Fatty (change of) liver, not elsewhere classified: Secondary | ICD-10-CM | POA: Insufficient documentation

## 2024-01-30 DIAGNOSIS — Z7969 Long term (current) use of other immunomodulators and immunosuppressants: Secondary | ICD-10-CM | POA: Insufficient documentation

## 2024-01-30 DIAGNOSIS — R718 Other abnormality of red blood cells: Secondary | ICD-10-CM | POA: Insufficient documentation

## 2024-01-30 DIAGNOSIS — Z85038 Personal history of other malignant neoplasm of large intestine: Secondary | ICD-10-CM | POA: Diagnosis not present

## 2024-01-30 DIAGNOSIS — Z5112 Encounter for antineoplastic immunotherapy: Secondary | ICD-10-CM | POA: Insufficient documentation

## 2024-01-30 DIAGNOSIS — E119 Type 2 diabetes mellitus without complications: Secondary | ICD-10-CM | POA: Diagnosis not present

## 2024-01-30 DIAGNOSIS — R590 Localized enlarged lymph nodes: Secondary | ICD-10-CM | POA: Insufficient documentation

## 2024-01-30 DIAGNOSIS — I10 Essential (primary) hypertension: Secondary | ICD-10-CM | POA: Insufficient documentation

## 2024-01-30 DIAGNOSIS — Z860100 Personal history of colon polyps, unspecified: Secondary | ICD-10-CM | POA: Insufficient documentation

## 2024-01-30 LAB — CBC WITH DIFFERENTIAL (CANCER CENTER ONLY)
Abs Immature Granulocytes: 0.01 10*3/uL (ref 0.00–0.07)
Basophils Absolute: 0 10*3/uL (ref 0.0–0.1)
Basophils Relative: 0 %
Eosinophils Absolute: 0.1 10*3/uL (ref 0.0–0.5)
Eosinophils Relative: 2 %
HCT: 49.2 % (ref 39.0–52.0)
Hemoglobin: 15.5 g/dL (ref 13.0–17.0)
Immature Granulocytes: 0 %
Lymphocytes Relative: 42 %
Lymphs Abs: 1.4 10*3/uL (ref 0.7–4.0)
MCH: 23.8 pg — ABNORMAL LOW (ref 26.0–34.0)
MCHC: 31.5 g/dL (ref 30.0–36.0)
MCV: 75.6 fL — ABNORMAL LOW (ref 80.0–100.0)
Monocytes Absolute: 0.6 10*3/uL (ref 0.1–1.0)
Monocytes Relative: 16 %
Neutro Abs: 1.4 10*3/uL — ABNORMAL LOW (ref 1.7–7.7)
Neutrophils Relative %: 40 %
Platelet Count: 167 10*3/uL (ref 150–400)
RBC: 6.51 MIL/uL — ABNORMAL HIGH (ref 4.22–5.81)
RDW: 17.2 % — ABNORMAL HIGH (ref 11.5–15.5)
WBC Count: 3.5 10*3/uL — ABNORMAL LOW (ref 4.0–10.5)
nRBC: 0 % (ref 0.0–0.2)

## 2024-01-30 LAB — CMP (CANCER CENTER ONLY)
ALT: 33 U/L (ref 0–44)
AST: 27 U/L (ref 15–41)
Albumin: 4.6 g/dL (ref 3.5–5.0)
Alkaline Phosphatase: 110 U/L (ref 38–126)
Anion gap: 8 (ref 5–15)
BUN: 14 mg/dL (ref 8–23)
CO2: 25 mmol/L (ref 22–32)
Calcium: 9.3 mg/dL (ref 8.9–10.3)
Chloride: 105 mmol/L (ref 98–111)
Creatinine: 1.03 mg/dL (ref 0.61–1.24)
GFR, Estimated: >60 mL/min (ref >60)
Glucose, Bld: 101 mg/dL — ABNORMAL HIGH (ref 70–99)
Potassium: 4.1 mmol/L (ref 3.5–5.1)
Sodium: 138 mmol/L (ref 135–145)
Total Bilirubin: 0.6 mg/dL (ref 0.0–1.2)
Total Protein: 6.9 g/dL (ref 6.5–8.1)

## 2024-01-30 LAB — URIC ACID: Uric Acid, Serum: 3.5 mg/dL — ABNORMAL LOW (ref 3.7–8.6)

## 2024-01-30 LAB — LACTATE DEHYDROGENASE: LDH: 166 U/L (ref 98–192)

## 2024-01-30 LAB — HEPATITIS B SURFACE ANTIGEN: Hepatitis B Surface Ag: NONREACTIVE

## 2024-01-30 MED ORDER — SODIUM CHLORIDE 0.9 % IV SOLN
1000.0000 mg | Freq: Once | INTRAVENOUS | Status: AC
  Administered 2024-01-30: 1000 mg via INTRAVENOUS
  Filled 2024-01-30: qty 40

## 2024-01-30 MED ORDER — ACETAMINOPHEN 325 MG PO TABS
650.0000 mg | ORAL_TABLET | Freq: Once | ORAL | Status: AC
  Administered 2024-01-30: 650 mg via ORAL
  Filled 2024-01-30: qty 2

## 2024-01-30 MED ORDER — DIPHENHYDRAMINE HCL 50 MG/ML IJ SOLN
50.0000 mg | Freq: Once | INTRAMUSCULAR | Status: AC
  Administered 2024-01-30: 50 mg via INTRAVENOUS
  Filled 2024-01-30: qty 1

## 2024-01-30 MED ORDER — SODIUM CHLORIDE 0.9 % IV SOLN
20.0000 mg | Freq: Once | INTRAVENOUS | Status: AC
  Administered 2024-01-30: 20 mg via INTRAVENOUS
  Filled 2024-01-30: qty 20

## 2024-01-30 MED ORDER — SODIUM CHLORIDE 0.9 % IV SOLN: INTRAVENOUS | Status: DC

## 2024-01-30 NOTE — Progress Notes (Signed)
 Patient presents today for chemotherapy infusion of Gazyva. Patient is in satisfactory condition with no new complaints voiced.  Vital signs are stable.  Labs reviewed by Dr. Truett Perna during the office visit and all labs are within treatment parameters with exception of ANC 1.4. Per Dr Truett Perna okay to treat.  We will proceed with treatment per MD orders.   Patient tolerated treatment well with no complaints voiced.  Patient left ambulatory in stable condition.  Vital signs stable at discharge.  Follow up as scheduled.

## 2024-01-30 NOTE — Progress Notes (Signed)
 Patient seen by Dr. Thornton Papas today  Vitals are within treatment parameters:Yes   Labs are within treatment parameters: Yes Neut 1.4 and WBC 3.5 Its ok to proceed.  Treatment plan has been signed: Yes   Per physician team, Patient is ready for treatment and there are NO modifications to the treatment plan.

## 2024-01-30 NOTE — Progress Notes (Signed)
 Called patient and confirmed he is still taking Venclexta 100mg  1 tablet every day. Patient confirms he has enough on hand to get to next appt scheduled for 02/27/24. Would like a call from pharmacy after appointment. Patient is aware to call pharmacy if needs a refill before 02/27/24.

## 2024-01-30 NOTE — Progress Notes (Signed)
 [facility] OFFICE PROGRESS NOTE   Diagnosis: CLL  INTERVAL HISTORY:   Mr. Longmore returns as scheduled.  He continues venetoclax.  He completed another treatment with obinutuzumab on 01/02/2024.  No rash or dyspnea.  No fever or night sweats.  No palpable lymph nodes.  He reports low-volume diarrhea beginning approximately 1 week ago.  He has up to 3 stools per day.  No nausea.  Objective:  Vital signs in last 24 hours:  Blood pressure 132/77, pulse 82, temperature 98.2 F (36.8 C), temperature source Temporal, resp. rate 18, height 5\' 10"  (1.778 m), weight 251 lb 1.6 oz (113.9 kg), SpO2 100%.    HEENT: Mild white coat over the tongue, no buccal thrush Lymphatics: No cervical, supraclavicular, axillary, or inguinal nodes Resp: Lungs clear bilaterally Cardio: Regular rate and rhythm GI: Soft, nontender, no hepatosplenomegaly Vascular: No leg edema   Lab Results:  Lab Results  Component Value Date   WBC 4.0 01/02/2024   HGB 15.6 01/02/2024   HCT 50.2 01/02/2024   MCV 76.4 (L) 01/02/2024   PLT 153 01/02/2024   NEUTROABS 1.3 (L) 01/02/2024    CMP  Lab Results  Component Value Date   NA 138 01/02/2024   K 4.5 01/02/2024   CL 102 01/02/2024   CO2 28 01/02/2024   GLUCOSE 126 (H) 01/02/2024   BUN 16 01/02/2024   CREATININE 1.10 01/02/2024   CALCIUM 9.6 01/02/2024   PROT 7.2 01/02/2024   ALBUMIN 4.3 01/02/2024   AST 34 01/02/2024   ALT 38 01/02/2024   ALKPHOS 86 01/02/2024   BILITOT 0.5 01/02/2024   GFRNONAA >60 01/02/2024   GFRAA >60 05/08/2020    Lab Results  Component Value Date   CEA1 1.52 10/19/2020   CEA 2.09 08/21/2023     Medications: I have reviewed the patient's current medications.   Assessment/Plan:  Colon cancer, sigmoid, stage IIIa (T1,N1), status post a partial colectomy 02/02/2017 1/26 lymph nodes positive MSI-stable, no loss of mismatch repair protein expression Staging CTs 12/29/2016-small indeterminate left lung  nodules Normal preoperative CEA Cycle 1 CAPOX 03/03/2017 Cycle 2 CAPOX 03/24/2017 Cycle 3 CAPOX 04/14/2017 Cycle 4 CAPOX 05/05/2017 CTs 11/06/2018- negative for recurrent disease, small left lung nodule stable from 2007-consider benign Colonoscopy 05/29/2019-negative CTs 10/25/2019-mild increase in a 1 cm small bowel mesenteric lymph node, no evidence of recurrent disease CTs 10/20/2020-several small left-sided lung nodule stable when compared to prior CTs.  No evidence of metastatic disease in the abdomen or pelvis.  Diffuse fatty infiltration of the liver.     2.  Indeterminate left lung nodules on a chest CT 12/29/2016, stable lung nodules on CT 10/25/2019   3.   Chronic red cell microcytosis   4.   Diabetes   5.   Hypertension   6.   Family history of colon cancer   7.   Multiple polyps noted on the colonoscopy 12/19/2016   8.   Neutropenia secondary to chemotherapy   9.   Extensive peripheral adenopathy on exam 08/21/2023-CTs neck through pelvis 08/24/2023 with extensive lymphadenopathy neck, chest, abdomen and pelvis.  Scattered tiny lung nodule stable from prior examination and compatible with a benign finding. 09/08/2023-right axillary lymph node biopsy-necrotic lymph node 09/22/2023-left axillary lymph node biopsy-75% of lymphocytes with CD5/CD200 positive lambda restricted on flow cytometry, surgical otology with a CD5 positive B-cell lymphoma, CD20, PAX5, cyclin D1 negative, perforation right 5-10%, CLL/SLL favored Cycle 1 day 1 obinutuzumab 10/03/2023, day 2 10/04/2023, day 8 10/09/2023, day 15  10/16/2023 Venetoclax 10/29/2023, ramp-up schedule Obinutuzumab and venetoclax placed on hold 10/31/2023 Venetoclax resumed 11/03/2023 Cycle 2 obinutuzumab 11/07/2023 Venetoclax dose escalated to 100 mg daily 11/26/2023 Cycle 3 obinutuzumab 12/05/2023, venetoclax continued 100 mg daily Cycle 4 obinutuzumab 01/02/2024, venetoclax continued 100 mg daily secondary to neutropenia Cycle 5  obinutuzumab 01/30/2024, venetoclax continued 100 mg daily secondary to neutropenia   10.  Positive hepatitis B core antibody and negative surface antigen 09/28/2023-repeat labs in 4 weeks. 11.  Herpetic lip lesions 10/09/2023-Valtrex  12.  Admission 11/02/2023 with somnolence       Disposition: Mr Chovanec appears stable.  He is in clinical remission from CLL.  He continues to tolerate the obinutuzumab and venetoclax well.  We were planning to dose escalate the venetoclax, but he has persistent mild neutropenia.  Venetoclax will remain at 100 mg daily.  Mr. Fitzsimmons will return for an office visit and obinutuzumab in 4 weeks.  Thornton Papas, MD  01/30/2024  8:29 AM

## 2024-01-30 NOTE — Patient Instructions (Signed)
 [facility]  Discharge Instructions: Thank you for choosing [facility] to provide your oncology and hematology care.   If you have a lab appointment with the Cancer Center, please go directly to the Cancer Center and check in at the registration area.   Wear comfortable clothing and clothing appropriate for easy access to any Portacath or PICC line.   We strive to give you quality time with your provider. You may need to reschedule your appointment if you arrive late (15 or more minutes).  Arriving late affects you and other patients whose appointments are after yours.  Also, if you miss three or more appointments without notifying the office, you may be dismissed from the clinic at the provider's discretion.      For prescription refill requests, have your pharmacy contact our office and allow 72 hours for refills to be completed.    Today you received the following chemotherapy and/or immunotherapy agents Gazyva.  Obinutuzumab Injection What is this medication? OBINUTUZUMAB (OH bi nue TOOZ ue mab) treats leukemia and lymphoma. It works by blocking a protein that causes cancer cells to grow and multiply. This helps to slow or stop the spread of cancer cells. It is a monoclonal antibody. This medicine may be used for other purposes; ask your health care provider or pharmacist if you have questions. COMMON BRAND NAME(S): GAZYVA What should I tell my care team before I take this medication? They need to know if you have any of these conditions: Heart disease Infection, especially a viral infection, such as hepatitis B Lung or breathing disease Take medications that treat or prevent blood clots An unusual or allergic reaction to obinutuzumab, other medications, foods, dyes, or preservatives Pregnant or trying to get pregnant Breastfeeding How should I use this medication? This medication is for infusion into a vein. It  is given by a care team in a hospital or clinic setting. Talk to your care team about the use of this medication in children. Special care may be needed. Overdosage: If you think you have taken too much of this medicine contact a poison control center or emergency room at once. NOTE: This medicine is only for you. Do not share this medicine with others. What if I miss a dose? Keep appointments for follow-up doses as directed. It is important not to miss your dose. Call your care team if you are unable to keep an appointment. What may interact with this medication? Live virus vaccines This list may not describe all possible interactions. Give your health care provider a list of all the medicines, herbs, non-prescription drugs, or dietary supplements you use. Also tell them if you smoke, drink alcohol, or use illegal drugs. Some items may interact with your medicine. What should I watch for while using this medication? Report any side effects that you notice during your treatment right away, such as changes in your breathing, fever, chills, dizziness or lightheadedness. These effects are more common with the first dose. Visit your care team for checks on your progress. You will need to have regular blood work. Report any other side effects. The side effects of this medication can continue after you finish your treatment. Continue your course of treatment even though you feel ill unless your care team tells you to stop. Call your care team for advice if you get a fever, chills or sore throat, or other symptoms of a cold or flu. Do  not treat yourself. This medication decreases your body's ability to fight infections. Try to avoid being around people who are sick. This medication may increase your risk to bruise or bleed. Call your care team if you notice any unusual bleeding. Do not become pregnant while taking this medication or for 6 months after stopping it. Inform your care team if you wish to become  pregnant or think you might be pregnant. There is a potential for serious side effects to an unborn child. Talk to your care team or pharmacist for more information. Do not breast-feed an infant while taking this medication or for 6 months after stopping it. What side effects may I notice from receiving this medication? Side effects that you should report to your care team as soon as possible: Allergic reactions--skin rash, itching, hives, swelling of the face, lips, tongue, or throat Bleeding--bloody or black, tar-like stools, vomiting blood or brown material that looks like coffee grounds, red or dark brown urine, small red or purple spots on skin, unusual bruising or bleeding Blood clot--pain, swelling, or warmth in the leg, shortness of breath, chest pain Dizziness, loss of balance or coordination, confusion or trouble speaking Infection--fever, chills, cough, sore throat, wounds that don't heal, pain or trouble when passing urine, general feeling of discomfort or being unwell Infusion reactions--chest pain, shortness of breath or trouble breathing, feeling faint or lightheaded Liver injury--right upper belly pain, loss of appetite, nausea, light-colored stool, dark yellow or brown urine, yellowing skin or eyes, unusual weakness or fatigue Tumor lysis syndrome (TLS)--nausea, vomiting, diarrhea, decrease in the amount of urine, dark urine, unusual weakness or fatigue, confusion, muscle pain or cramps, fast or irregular heartbeat, joint pain Side effects that usually do not require medical attention (report to your care team if they continue or are bothersome): Bone, joint, or muscle pain Constipation Diarrhea Fatigue Runny or stuffy nose Sore throat This list may not describe all possible side effects. Call your doctor for medical advice about side effects. You may report side effects to FDA at 1-800-FDA-1088. Where should I keep my medication? This medication is only given in a hospital or  clinic and will not be stored at home. NOTE: This sheet is a summary. It may not cover all possible information. If you have questions about this medicine, talk to your doctor, pharmacist, or health care provider.  2024 Elsevier/Gold Standard (2022-04-06 00:00:00)      To help prevent nausea and vomiting after your treatment, we encourage you to take your nausea medication as directed.  BELOW ARE SYMPTOMS THAT SHOULD BE REPORTED IMMEDIATELY: *FEVER GREATER THAN 100.4 F (38 C) OR HIGHER *CHILLS OR SWEATING *NAUSEA AND VOMITING THAT IS NOT CONTROLLED WITH YOUR NAUSEA MEDICATION *UNUSUAL SHORTNESS OF BREATH *UNUSUAL BRUISING OR BLEEDING *URINARY PROBLEMS (pain or burning when urinating, or frequent urination) *BOWEL PROBLEMS (unusual diarrhea, constipation, pain near the anus) TENDERNESS IN MOUTH AND THROAT WITH OR WITHOUT PRESENCE OF ULCERS (sore throat, sores in mouth, or a toothache) UNUSUAL RASH, SWELLING OR PAIN  UNUSUAL VAGINAL DISCHARGE OR ITCHING   Items with * indicate a potential emergency and should be followed up as soon as possible or go to the Emergency Department if any problems should occur.  Please show the CHEMOTHERAPY ALERT CARD or IMMUNOTHERAPY ALERT CARD at check-in to the Emergency Department and triage nurse.  Should you have questions after your visit or need to cancel or reschedule your appointment, please contact [facility] - A DEPT OF [place].  [facility]  Dept: [phone]  and follow the prompts.  Office hours are 8:00 a.m. to 4:30 p.m. Monday - Friday. Please note that voicemails left after 4:00 p.m. may not be returned until the following business day.  We are closed weekends and major holidays. You have access to a nurse at all times for urgent questions. Please call the main number to the clinic Dept: [phone] and follow the prompts.   For any non-urgent questions, you may also contact your provider using MyChart. We now offer  e-Visits for anyone 45 and older to request care online for non-urgent symptoms. For details visit [email or web address].   Also download the MyChart app! Go to the app store, search "MyChart", open the app, select [place], and log in with your MyChart username and password.

## 2024-01-30 NOTE — Telephone Encounter (Signed)
 The patient has called to inform us that he is en route and will arrive in approximately 5 to 10 minutes.

## 2024-01-31 ENCOUNTER — Other Ambulatory Visit: Payer: Self-pay

## 2024-02-01 ENCOUNTER — Other Ambulatory Visit: Payer: Self-pay | Admitting: Oncology

## 2024-02-13 ENCOUNTER — Inpatient Hospital Stay: Payer: Medicare (Managed Care)

## 2024-02-13 DIAGNOSIS — C8300 Small cell B-cell lymphoma, unspecified site: Secondary | ICD-10-CM

## 2024-02-13 DIAGNOSIS — Z5112 Encounter for antineoplastic immunotherapy: Secondary | ICD-10-CM | POA: Diagnosis not present

## 2024-02-13 LAB — CBC WITH DIFFERENTIAL (CANCER CENTER ONLY)
Abs Immature Granulocytes: 0.06 10*3/uL (ref 0.00–0.07)
Basophils Absolute: 0 10*3/uL (ref 0.0–0.1)
Basophils Relative: 0 %
Eosinophils Absolute: 0.2 10*3/uL (ref 0.0–0.5)
Eosinophils Relative: 4 %
HCT: 49.5 % (ref 39.0–52.0)
Hemoglobin: 15.5 g/dL (ref 13.0–17.0)
Immature Granulocytes: 1 %
Lymphocytes Relative: 23 %
Lymphs Abs: 1.4 10*3/uL (ref 0.7–4.0)
MCH: 24 pg — ABNORMAL LOW (ref 26.0–34.0)
MCHC: 31.3 g/dL (ref 30.0–36.0)
MCV: 76.7 fL — ABNORMAL LOW (ref 80.0–100.0)
Monocytes Absolute: 0.6 10*3/uL (ref 0.1–1.0)
Monocytes Relative: 10 %
Neutro Abs: 3.6 10*3/uL (ref 1.7–7.7)
Neutrophils Relative %: 62 %
Platelet Count: 136 10*3/uL — ABNORMAL LOW (ref 150–400)
RBC: 6.45 MIL/uL — ABNORMAL HIGH (ref 4.22–5.81)
RDW: 18 % — ABNORMAL HIGH (ref 11.5–15.5)
WBC Count: 5.9 10*3/uL (ref 4.0–10.5)
nRBC: 0 % (ref 0.0–0.2)

## 2024-02-13 LAB — CMP (CANCER CENTER ONLY)
ALT: 31 U/L (ref 0–44)
AST: 32 U/L (ref 15–41)
Albumin: 4.7 g/dL (ref 3.5–5.0)
Alkaline Phosphatase: 97 U/L (ref 38–126)
Anion gap: 7 (ref 5–15)
BUN: 16 mg/dL (ref 8–23)
CO2: 29 mmol/L (ref 22–32)
Calcium: 9.7 mg/dL (ref 8.9–10.3)
Chloride: 106 mmol/L (ref 98–111)
Creatinine: 1.17 mg/dL (ref 0.61–1.24)
GFR, Estimated: >60 mL/min (ref >60)
Glucose, Bld: 104 mg/dL — ABNORMAL HIGH (ref 70–99)
Potassium: 4.1 mmol/L (ref 3.5–5.1)
Sodium: 142 mmol/L (ref 135–145)
Total Bilirubin: 0.6 mg/dL (ref 0.0–1.2)
Total Protein: 7.4 g/dL (ref 6.5–8.1)

## 2024-02-13 LAB — URIC ACID: Uric Acid, Serum: 3.1 mg/dL — ABNORMAL LOW (ref 3.7–8.6)

## 2024-02-18 ENCOUNTER — Other Ambulatory Visit: Payer: Self-pay

## 2024-02-22 ENCOUNTER — Other Ambulatory Visit (HOSPITAL_COMMUNITY): Payer: Self-pay

## 2024-02-23 ENCOUNTER — Other Ambulatory Visit: Payer: Self-pay | Admitting: Oncology

## 2024-02-27 ENCOUNTER — Inpatient Hospital Stay: Payer: Medicare (Managed Care)

## 2024-02-27 ENCOUNTER — Encounter: Payer: Self-pay | Admitting: Nurse Practitioner

## 2024-02-27 ENCOUNTER — Inpatient Hospital Stay: Payer: Medicare (Managed Care) | Attending: Oncology

## 2024-02-27 ENCOUNTER — Inpatient Hospital Stay (HOSPITAL_BASED_OUTPATIENT_CLINIC_OR_DEPARTMENT_OTHER): Payer: Medicare (Managed Care) | Admitting: Nurse Practitioner

## 2024-02-27 VITALS — BP 130/76 | HR 85 | Temp 98.2°F | Resp 18

## 2024-02-27 VITALS — BP 124/81 | HR 76 | Temp 97.7°F | Resp 18 | Ht 70.0 in | Wt 250.0 lb

## 2024-02-27 DIAGNOSIS — Z79899 Other long term (current) drug therapy: Secondary | ICD-10-CM | POA: Diagnosis not present

## 2024-02-27 DIAGNOSIS — K760 Fatty (change of) liver, not elsewhere classified: Secondary | ICD-10-CM | POA: Insufficient documentation

## 2024-02-27 DIAGNOSIS — Z5112 Encounter for antineoplastic immunotherapy: Secondary | ICD-10-CM | POA: Insufficient documentation

## 2024-02-27 DIAGNOSIS — C8300 Small cell B-cell lymphoma, unspecified site: Secondary | ICD-10-CM | POA: Diagnosis not present

## 2024-02-27 DIAGNOSIS — Z79620 Long term (current) use of immunosuppressive biologic: Secondary | ICD-10-CM | POA: Diagnosis not present

## 2024-02-27 DIAGNOSIS — Z7969 Long term (current) use of other immunomodulators and immunosuppressants: Secondary | ICD-10-CM | POA: Diagnosis not present

## 2024-02-27 DIAGNOSIS — I10 Essential (primary) hypertension: Secondary | ICD-10-CM | POA: Diagnosis not present

## 2024-02-27 DIAGNOSIS — R718 Other abnormality of red blood cells: Secondary | ICD-10-CM | POA: Diagnosis not present

## 2024-02-27 DIAGNOSIS — Z800 Family history of malignant neoplasm of digestive organs: Secondary | ICD-10-CM | POA: Diagnosis not present

## 2024-02-27 DIAGNOSIS — E119 Type 2 diabetes mellitus without complications: Secondary | ICD-10-CM | POA: Insufficient documentation

## 2024-02-27 DIAGNOSIS — Z860100 Personal history of colon polyps, unspecified: Secondary | ICD-10-CM | POA: Insufficient documentation

## 2024-02-27 DIAGNOSIS — T451X5A Adverse effect of antineoplastic and immunosuppressive drugs, initial encounter: Secondary | ICD-10-CM | POA: Insufficient documentation

## 2024-02-27 DIAGNOSIS — C9110 Chronic lymphocytic leukemia of B-cell type not having achieved remission: Secondary | ICD-10-CM | POA: Diagnosis not present

## 2024-02-27 DIAGNOSIS — D701 Agranulocytosis secondary to cancer chemotherapy: Secondary | ICD-10-CM | POA: Diagnosis not present

## 2024-02-27 DIAGNOSIS — C8514 Unspecified B-cell lymphoma, lymph nodes of axilla and upper limb: Secondary | ICD-10-CM | POA: Diagnosis present

## 2024-02-27 DIAGNOSIS — R590 Localized enlarged lymph nodes: Secondary | ICD-10-CM | POA: Insufficient documentation

## 2024-02-27 DIAGNOSIS — R918 Other nonspecific abnormal finding of lung field: Secondary | ICD-10-CM | POA: Diagnosis not present

## 2024-02-27 LAB — CBC WITH DIFFERENTIAL (CANCER CENTER ONLY)
Abs Immature Granulocytes: 0.11 10*3/uL — ABNORMAL HIGH (ref 0.00–0.07)
Basophils Absolute: 0 10*3/uL (ref 0.0–0.1)
Basophils Relative: 0 %
Eosinophils Absolute: 0.1 10*3/uL (ref 0.0–0.5)
Eosinophils Relative: 1 %
HCT: 49.5 % (ref 39.0–52.0)
Hemoglobin: 15.3 g/dL (ref 13.0–17.0)
Immature Granulocytes: 2 %
Lymphocytes Relative: 23 %
Lymphs Abs: 1.4 10*3/uL (ref 0.7–4.0)
MCH: 23.7 pg — ABNORMAL LOW (ref 26.0–34.0)
MCHC: 30.9 g/dL (ref 30.0–36.0)
MCV: 76.6 fL — ABNORMAL LOW (ref 80.0–100.0)
Monocytes Absolute: 0.8 10*3/uL (ref 0.1–1.0)
Monocytes Relative: 13 %
Neutro Abs: 3.6 10*3/uL (ref 1.7–7.7)
Neutrophils Relative %: 61 %
Platelet Count: 134 10*3/uL — ABNORMAL LOW (ref 150–400)
RBC: 6.46 MIL/uL — ABNORMAL HIGH (ref 4.22–5.81)
RDW: 17.2 % — ABNORMAL HIGH (ref 11.5–15.5)
WBC Count: 5.9 10*3/uL (ref 4.0–10.5)
nRBC: 0 % (ref 0.0–0.2)

## 2024-02-27 LAB — CMP (CANCER CENTER ONLY)
ALT: 29 U/L (ref 0–44)
AST: 30 U/L (ref 15–41)
Albumin: 4.4 g/dL (ref 3.5–5.0)
Alkaline Phosphatase: 101 U/L (ref 38–126)
Anion gap: 8 (ref 5–15)
BUN: 12 mg/dL (ref 8–23)
CO2: 28 mmol/L (ref 22–32)
Calcium: 9.5 mg/dL (ref 8.9–10.3)
Chloride: 105 mmol/L (ref 98–111)
Creatinine: 1.09 mg/dL (ref 0.61–1.24)
GFR, Estimated: >60 mL/min (ref >60)
Glucose, Bld: 112 mg/dL — ABNORMAL HIGH (ref 70–99)
Potassium: 4 mmol/L (ref 3.5–5.1)
Sodium: 141 mmol/L (ref 135–145)
Total Bilirubin: 0.5 mg/dL (ref 0.0–1.2)
Total Protein: 7.1 g/dL (ref 6.5–8.1)

## 2024-02-27 LAB — LACTATE DEHYDROGENASE: LDH: 283 U/L — ABNORMAL HIGH (ref 98–192)

## 2024-02-27 LAB — URIC ACID: Uric Acid, Serum: 4.1 mg/dL (ref 3.7–8.6)

## 2024-02-27 MED ORDER — SODIUM CHLORIDE 0.9 % IV SOLN
INTRAVENOUS | Status: DC
Start: 2024-02-27 — End: 2024-02-27

## 2024-02-27 MED ORDER — DIPHENHYDRAMINE HCL 50 MG/ML IJ SOLN
50.0000 mg | Freq: Once | INTRAMUSCULAR | Status: AC
  Administered 2024-02-27: 50 mg via INTRAVENOUS
  Filled 2024-02-27: qty 1

## 2024-02-27 MED ORDER — SODIUM CHLORIDE 0.9 % IV SOLN
20.0000 mg | Freq: Once | INTRAVENOUS | Status: AC
  Administered 2024-02-27: 20 mg via INTRAVENOUS
  Filled 2024-02-27: qty 20

## 2024-02-27 MED ORDER — SODIUM CHLORIDE 0.9 % IV SOLN
1000.0000 mg | Freq: Once | INTRAVENOUS | Status: AC
  Administered 2024-02-27: 1000 mg via INTRAVENOUS
  Filled 2024-02-27: qty 40

## 2024-02-27 MED ORDER — ACETAMINOPHEN 325 MG PO TABS
650.0000 mg | ORAL_TABLET | Freq: Once | ORAL | Status: AC
  Administered 2024-02-27: 650 mg via ORAL
  Filled 2024-02-27: qty 2

## 2024-02-27 NOTE — Progress Notes (Signed)
 [facility] OFFICE PROGRESS NOTE   Diagnosis: CLL  INTERVAL HISTORY:   Angel [name] returns as scheduled.  He continues venetoclax.  He completed another cycle of obinutuzumab 01/30/2024.  He feels well.  No fevers or sweats.  He denies nausea/vomiting.  No mouth sores.  No diarrhea.  No rash.  No enlarged lymph nodes.  He has a good appetite.  Objective:  Vital signs in last 24 hours:  Blood pressure 124/81, pulse 76, temperature 97.7 F (36.5 C), temperature source Oral, resp. rate 18, height 5\' 10"  (1.778 m), weight 250 lb (113.4 kg), SpO2 100%.    HEENT: No thrush or ulcers. Lymphatics: No palpable cervical, supraclavicular, axillary or inguinal lymph nodes. Resp: Lungs clear bilaterally. Cardio: Regular rate and rhythm. GI: Abdomen soft and nontender.  No hepatosplenomegaly. Vascular: No leg edema. Neuro: Alert and oriented. Skin: No rash.   Lab Results:  Lab Results  Component Value Date   WBC 5.9 02/13/2024   HGB 15.5 02/13/2024   HCT 49.5 02/13/2024   MCV 76.7 (L) 02/13/2024   PLT 136 (L) 02/13/2024   NEUTROABS 3.6 02/13/2024    Imaging:  No results found.  Medications: I have reviewed the patient's current medications.  Assessment/Plan: Colon cancer, sigmoid, stage IIIa (T1,N1), status post a partial colectomy 02/02/2017 1/26 lymph nodes positive MSI-stable, no loss of mismatch repair protein expression Staging CTs 12/29/2016-small indeterminate left lung nodules Normal preoperative CEA Cycle 1 CAPOX 03/03/2017 Cycle 2 CAPOX 03/24/2017 Cycle 3 CAPOX 04/14/2017 Cycle 4 CAPOX 05/05/2017 CTs 11/06/2018- negative for recurrent disease, small left lung nodule stable from 2007-consider benign Colonoscopy 05/29/2019-negative CTs 10/25/2019-mild increase in a 1 cm small bowel mesenteric lymph node, no evidence of recurrent disease CTs 10/20/2020-several small left-sided lung nodule stable when compared to prior CTs.  No evidence of metastatic  disease in the abdomen or pelvis.  Diffuse fatty infiltration of the liver.     2.  Indeterminate left lung nodules on a chest CT 12/29/2016, stable lung nodules on CT 10/25/2019   3.   Chronic red cell microcytosis   4.   Diabetes   5.   Hypertension   6.   Family history of colon cancer   7.   Multiple polyps noted on the colonoscopy 12/19/2016   8.   Neutropenia secondary to chemotherapy   9.   Extensive peripheral adenopathy on exam 08/21/2023-CTs neck through pelvis 08/24/2023 with extensive lymphadenopathy neck, chest, abdomen and pelvis.  Scattered tiny lung nodule stable from prior examination and compatible with a benign finding. 09/08/2023-right axillary lymph node biopsy-necrotic lymph node 09/22/2023-left axillary lymph node biopsy-75% of lymphocytes with CD5/CD200 positive lambda restricted on flow cytometry, surgical otology with a CD5 positive B-cell lymphoma, CD20, PAX5, cyclin D1 negative, perforation right 5-10%, CLL/SLL favored Cycle 1 day 1 obinutuzumab 10/03/2023, day 2 10/04/2023, day 8 10/09/2023, day 15 10/16/2023 Venetoclax 10/29/2023, ramp-up schedule Obinutuzumab and venetoclax placed on hold 10/31/2023 Venetoclax resumed 11/03/2023 Cycle 2 obinutuzumab 11/07/2023 Venetoclax dose escalated to 100 mg daily 11/26/2023 Cycle 3 obinutuzumab 12/05/2023, venetoclax continued 100 mg daily Cycle 4 obinutuzumab 01/02/2024, venetoclax continued 100 mg daily secondary to neutropenia Cycle 5 obinutuzumab 01/30/2024, venetoclax continued 100 mg daily secondary to neutropenia Cycle 6 obinutuzumab 02/27/2024, venetoclax increased to 200 mg daily   10.  Positive hepatitis B core antibody and negative surface antigen 09/28/2023-repeat labs in 4 weeks. 11.  Herpetic lip lesions 10/09/2023-Valtrex  12.  Admission 11/02/2023 with somnolence      Disposition: [name] appears stable.  He has completed 5 cycles of obinutuzumab.  He continues venetoclax 100 mg daily.  He is tolerating  treatment well.  Plan to proceed with cycle 6 obinutuzumab today.  Venetoclax dose will be escalated to 200 mg daily.  He will return for a CBC in 2 weeks.  CBC and chemistry panel reviewed.  Labs adequate to proceed as above.  He has stable mild thrombocytopenia.  He will return for follow-up in 4 weeks.  We are available to see him sooner if needed.  Plan reviewed with Dr. Truett Perna.  Lonna Cobb ANP/GNP-BC   02/27/2024  8:19 AM

## 2024-02-27 NOTE — Progress Notes (Signed)
 Patient seen by Lonna Cobb NP today  Vitals are within treatment parameters:Yes   Labs are within treatment parameters: Yes   Treatment plan has been signed: Yes   Per physician team, Patient is ready for treatment and there are NO modifications to the treatment plan.

## 2024-02-27 NOTE — Patient Instructions (Signed)
 [facility]   Discharge Instructions: Thank you for choosing [facility] to provide your oncology and hematology care.   If you have a lab appointment with the Cancer Center, please go directly to the Cancer Center and check in at the registration area.   Wear comfortable clothing and clothing appropriate for easy access to any Portacath or PICC line.   We strive to give you quality time with your provider. You may need to reschedule your appointment if you arrive late (15 or more minutes).  Arriving late affects you and other patients whose appointments are after yours.  Also, if you miss three or more appointments without notifying the office, you may be dismissed from the clinic at the provider's discretion.      For prescription refill requests, have your pharmacy contact our office and allow 72 hours for refills to be completed.    Today you received the following chemotherapy and/or immunotherapy agents GAZYVA       To help prevent nausea and vomiting after your treatment, we encourage you to take your nausea medication as directed.  BELOW ARE SYMPTOMS THAT SHOULD BE REPORTED IMMEDIATELY: *FEVER GREATER THAN 100.4 F (38 C) OR HIGHER *CHILLS OR SWEATING *NAUSEA AND VOMITING THAT IS NOT CONTROLLED WITH YOUR NAUSEA MEDICATION *UNUSUAL SHORTNESS OF BREATH *UNUSUAL BRUISING OR BLEEDING *URINARY PROBLEMS (pain or burning when urinating, or frequent urination) *BOWEL PROBLEMS (unusual diarrhea, constipation, pain near the anus) TENDERNESS IN MOUTH AND THROAT WITH OR WITHOUT PRESENCE OF ULCERS (sore throat, sores in mouth, or a toothache) UNUSUAL RASH, SWELLING OR PAIN  UNUSUAL VAGINAL DISCHARGE OR ITCHING   Items with * indicate a potential emergency and should be followed up as soon as possible or go to the Emergency Department if any problems should occur.  Please show the CHEMOTHERAPY ALERT CARD or IMMUNOTHERAPY  ALERT CARD at check-in to the Emergency Department and triage nurse.  Should you have questions after your visit or need to cancel or reschedule your appointment, please contact [facility]  Dept: [phone]  and follow the prompts.  Office hours are 8:00 a.m. to 4:30 p.m. Monday - Friday. Please note that voicemails left after 4:00 p.m. may not be returned until the following business day.  We are closed weekends and major holidays. You have access to a nurse at all times for urgent questions. Please call the main number to the clinic Dept: [phone] and follow the prompts.   For any non-urgent questions, you may also contact your provider using MyChart. We now offer e-Visits for anyone 92 and older to request care online for non-urgent symptoms. For details visit [email or web address].   Also download the MyChart app! Go to the app store, search "MyChart", open the app, select [place], and log in with your MyChart username and password.  Obinutuzumab Injection What is this medication? OBINUTUZUMAB (OH bi nue TOOZ ue mab) treats leukemia and lymphoma. It works by blocking a protein that causes cancer cells to grow and multiply. This helps to slow or stop the spread of cancer cells. It is a monoclonal antibody. This medicine may be used for other purposes; ask your health care provider or pharmacist if you have questions. COMMON BRAND NAME(S): GAZYVA What should I tell my care team before I take this medication? They need to know if you have any of these conditions: Heart  disease Infection, especially a viral infection, such as hepatitis B Lung or breathing disease Take medications that treat or prevent blood clots An unusual or allergic reaction to obinutuzumab, other medications, foods, dyes, or preservatives Pregnant or trying to get pregnant Breastfeeding How should I use this medication? This medication is for infusion  into a vein. It is given by a care team in a hospital or clinic setting. Talk to your care team about the use of this medication in children. Special care may be needed. Overdosage: If you think you have taken too much of this medicine contact a poison control center or emergency room at once. NOTE: This medicine is only for you. Do not share this medicine with others. What if I miss a dose? Keep appointments for follow-up doses as directed. It is important not to miss your dose. Call your care team if you are unable to keep an appointment. What may interact with this medication? Live virus vaccines This list may not describe all possible interactions. Give your health care provider a list of all the medicines, herbs, non-prescription drugs, or dietary supplements you use. Also tell them if you smoke, drink alcohol, or use illegal drugs. Some items may interact with your medicine. What should I watch for while using this medication? Report any side effects that you notice during your treatment right away, such as changes in your breathing, fever, chills, dizziness or lightheadedness. These effects are more common with the first dose. Visit your care team for checks on your progress. You will need to have regular blood work. Report any other side effects. The side effects of this medication can continue after you finish your treatment. Continue your course of treatment even though you feel ill unless your care team tells you to stop. Call your care team for advice if you get a fever, chills or sore throat, or other symptoms of a cold or flu. Do not treat yourself. This medication decreases your body's ability to fight infections. Try to avoid being around people who are sick. This medication may increase your risk to bruise or bleed. Call your care team if you notice any unusual bleeding. Do not become pregnant while taking this medication or for 6 months after stopping it. Inform your care team if you  wish to become pregnant or think you might be pregnant. There is a potential for serious side effects to an unborn child. Talk to your care team or pharmacist for more information. Do not breast-feed an infant while taking this medication or for 6 months after stopping it. What side effects may I notice from receiving this medication? Side effects that you should report to your care team as soon as possible: Allergic reactions--skin rash, itching, hives, swelling of the face, lips, tongue, or throat Bleeding--bloody or black, tar-like stools, vomiting blood or brown material that looks like coffee grounds, red or dark brown urine, small red or purple spots on skin, unusual bruising or bleeding Blood clot--pain, swelling, or warmth in the leg, shortness of breath, chest pain Dizziness, loss of balance or coordination, confusion or trouble speaking Infection--fever, chills, cough, sore throat, wounds that don't heal, pain or trouble when passing urine, general feeling of discomfort or being unwell Infusion reactions--chest pain, shortness of breath or trouble breathing, feeling faint or lightheaded Liver injury--right upper belly pain, loss of appetite, nausea, light-colored stool, dark yellow or brown urine, yellowing skin or eyes, unusual weakness or fatigue Tumor lysis syndrome (TLS)--nausea, vomiting, diarrhea, decrease in the  amount of urine, dark urine, unusual weakness or fatigue, confusion, muscle pain or cramps, fast or irregular heartbeat, joint pain Side effects that usually do not require medical attention (report to your care team if they continue or are bothersome): Bone, joint, or muscle pain Constipation Diarrhea Fatigue Runny or stuffy nose Sore throat This list may not describe all possible side effects. Call your doctor for medical advice about side effects. You may report side effects to FDA at 1-800-FDA-1088. Where should I keep my medication? This medication is only given in a  hospital or clinic and will not be stored at home. NOTE: This sheet is a summary. It may not cover all possible information. If you have questions about this medicine, talk to your doctor, pharmacist, or health care provider.  2024 Elsevier/Gold Standard (2022-04-06 00:00:00)

## 2024-02-29 ENCOUNTER — Other Ambulatory Visit: Payer: Self-pay

## 2024-02-29 NOTE — Progress Notes (Signed)
 Specialty Pharmacy Ongoing Clinical Assessment Note  Angel [name] is a 73 y.o. male who is being followed by the specialty pharmacy service for RxSp Oncology   Patient's specialty medication(s) reviewed today: Venetoclax (VENCLEXTA)   Missed doses in the last 4 weeks: 0   Patient/Caregiver did not have any additional questions or concerns.   Therapeutic benefit summary: Patient is achieving benefit   Adverse events/side effects summary: No adverse events/side effects   Patient's therapy is appropriate to: Continue    Goals Addressed             This Visit's Progress    Achieve or maintain remission       Patient is on track. Patient will maintain adherence          Follow up:  3 months  [name] Specialty Pharmacist

## 2024-03-02 ENCOUNTER — Telehealth: Payer: Self-pay | Admitting: Nurse Practitioner

## 2024-03-02 NOTE — Telephone Encounter (Signed)
 Patient has been scheduled per 02/27/24 LOS. Aware of appt date and time.

## 2024-03-04 ENCOUNTER — Other Ambulatory Visit: Payer: Self-pay

## 2024-03-12 ENCOUNTER — Inpatient Hospital Stay: Payer: Medicare (Managed Care)

## 2024-03-12 DIAGNOSIS — C8300 Small cell B-cell lymphoma, unspecified site: Secondary | ICD-10-CM

## 2024-03-12 DIAGNOSIS — Z5112 Encounter for antineoplastic immunotherapy: Secondary | ICD-10-CM | POA: Diagnosis not present

## 2024-03-12 LAB — CBC WITH DIFFERENTIAL (CANCER CENTER ONLY)
Abs Immature Granulocytes: 0.05 10*3/uL (ref 0.00–0.07)
Basophils Absolute: 0 10*3/uL (ref 0.0–0.1)
Basophils Relative: 0 %
Eosinophils Absolute: 0 10*3/uL (ref 0.0–0.5)
Eosinophils Relative: 1 %
HCT: 49 % (ref 39.0–52.0)
Hemoglobin: 15.2 g/dL (ref 13.0–17.0)
Immature Granulocytes: 1 %
Lymphocytes Relative: 32 %
Lymphs Abs: 1.7 10*3/uL (ref 0.7–4.0)
MCH: 23.7 pg — ABNORMAL LOW (ref 26.0–34.0)
MCHC: 31 g/dL (ref 30.0–36.0)
MCV: 76.4 fL — ABNORMAL LOW (ref 80.0–100.0)
Monocytes Absolute: 0.7 10*3/uL (ref 0.1–1.0)
Monocytes Relative: 12 %
Neutro Abs: 2.9 10*3/uL (ref 1.7–7.7)
Neutrophils Relative %: 54 %
Platelet Count: 133 10*3/uL — ABNORMAL LOW (ref 150–400)
RBC: 6.41 MIL/uL — ABNORMAL HIGH (ref 4.22–5.81)
RDW: 17.4 % — ABNORMAL HIGH (ref 11.5–15.5)
WBC Count: 5.4 10*3/uL (ref 4.0–10.5)
nRBC: 0 % (ref 0.0–0.2)

## 2024-03-18 ENCOUNTER — Other Ambulatory Visit: Payer: Self-pay | Admitting: Oncology

## 2024-03-18 DIAGNOSIS — C8300 Small cell B-cell lymphoma, unspecified site: Secondary | ICD-10-CM

## 2024-03-19 ENCOUNTER — Telehealth: Payer: Self-pay

## 2024-03-19 NOTE — Telephone Encounter (Signed)
-----   Message from Diana Forster sent at 03/18/2024  1:10 PM EDT ----- Please let him know CBC completed 03/12/2024 is stable, follow-up as scheduled.

## 2024-03-19 NOTE — Telephone Encounter (Signed)
 Patient gave verbal understanding and had no further questions or concerns

## 2024-03-19 NOTE — Telephone Encounter (Signed)
 The scheduler will reach out to him and schedule his next appointment

## 2024-03-19 NOTE — Telephone Encounter (Signed)
-----   Message from Angel [name] sent at 03/18/2024  1:10 PM EDT ----- Please let him know CBC completed 03/12/2024 is stable, follow-up as scheduled.

## 2024-03-21 ENCOUNTER — Other Ambulatory Visit: Payer: Self-pay

## 2024-03-24 ENCOUNTER — Other Ambulatory Visit: Payer: Self-pay | Admitting: Oncology

## 2024-03-26 ENCOUNTER — Inpatient Hospital Stay (HOSPITAL_BASED_OUTPATIENT_CLINIC_OR_DEPARTMENT_OTHER): Payer: Medicare (Managed Care) | Admitting: Oncology

## 2024-03-26 ENCOUNTER — Inpatient Hospital Stay: Payer: Medicare (Managed Care)

## 2024-03-26 ENCOUNTER — Encounter: Payer: Self-pay | Admitting: Oncology

## 2024-03-26 VITALS — BP 142/83 | HR 76 | Temp 98.3°F | Resp 20 | Ht 70.0 in | Wt 254.2 lb

## 2024-03-26 DIAGNOSIS — C8300 Small cell B-cell lymphoma, unspecified site: Secondary | ICD-10-CM

## 2024-03-26 DIAGNOSIS — Z5112 Encounter for antineoplastic immunotherapy: Secondary | ICD-10-CM | POA: Diagnosis not present

## 2024-03-26 LAB — CBC WITH DIFFERENTIAL (CANCER CENTER ONLY)
Abs Immature Granulocytes: 0.06 10*3/uL (ref 0.00–0.07)
Basophils Absolute: 0 10*3/uL (ref 0.0–0.1)
Basophils Relative: 0 %
Eosinophils Absolute: 0 10*3/uL (ref 0.0–0.5)
Eosinophils Relative: 1 %
HCT: 49.2 % (ref 39.0–52.0)
Hemoglobin: 15.6 g/dL (ref 13.0–17.0)
Immature Granulocytes: 1 %
Lymphocytes Relative: 32 %
Lymphs Abs: 1.5 10*3/uL (ref 0.7–4.0)
MCH: 24.2 pg — ABNORMAL LOW (ref 26.0–34.0)
MCHC: 31.7 g/dL (ref 30.0–36.0)
MCV: 76.3 fL — ABNORMAL LOW (ref 80.0–100.0)
Monocytes Absolute: 0.8 10*3/uL (ref 0.1–1.0)
Monocytes Relative: 17 %
Neutro Abs: 2.3 10*3/uL (ref 1.7–7.7)
Neutrophils Relative %: 49 %
Platelet Count: 145 10*3/uL — ABNORMAL LOW (ref 150–400)
RBC: 6.45 MIL/uL — ABNORMAL HIGH (ref 4.22–5.81)
RDW: 18.1 % — ABNORMAL HIGH (ref 11.5–15.5)
WBC Count: 4.7 10*3/uL (ref 4.0–10.5)
nRBC: 0 % (ref 0.0–0.2)

## 2024-03-26 LAB — LACTATE DEHYDROGENASE: LDH: 294 U/L — ABNORMAL HIGH (ref 98–192)

## 2024-03-26 LAB — CMP (CANCER CENTER ONLY)
ALT: 37 U/L (ref 0–44)
AST: 43 U/L — ABNORMAL HIGH (ref 15–41)
Albumin: 4.8 g/dL (ref 3.5–5.0)
Alkaline Phosphatase: 120 U/L (ref 38–126)
Anion gap: 12 (ref 5–15)
BUN: 14 mg/dL (ref 8–23)
CO2: 23 mmol/L (ref 22–32)
Calcium: 9.9 mg/dL (ref 8.9–10.3)
Chloride: 102 mmol/L (ref 98–111)
Creatinine: 1.06 mg/dL (ref 0.61–1.24)
GFR, Estimated: >60 mL/min (ref >60)
Glucose, Bld: 111 mg/dL — ABNORMAL HIGH (ref 70–99)
Potassium: 4.3 mmol/L (ref 3.5–5.1)
Sodium: 137 mmol/L (ref 135–145)
Total Bilirubin: 0.4 mg/dL (ref 0.0–1.2)
Total Protein: 7.2 g/dL (ref 6.5–8.1)

## 2024-03-26 LAB — URIC ACID: Uric Acid, Serum: 3.7 mg/dL (ref 3.7–8.6)

## 2024-03-26 NOTE — Progress Notes (Signed)
 [facility] OFFICE PROGRESS NOTE   Diagnosis: CLL  INTERVAL HISTORY:   Angel [name] returns as scheduled.  He feels well.  No rash, diarrhea, or palpable lymph nodes.  He continues venetoclax  at a dose of 200 mg daily.  No complaint.  Objective:  Vital signs in last 24 hours:  Blood pressure (!) 142/83, pulse 76, temperature 98.3 F (36.8 C), temperature source Oral, resp. rate 20, height 5\' 10"  (1.778 m), weight 254 lb 3.2 oz (115.3 kg), SpO2 98%.    HEENT: No thrush or ulcers Lymphatics: No cervical, supraclavicular, axillary, or inguinal nodes Resp: Lungs clear bilaterally Cardio: Regular rate and rhythm GI: No hepatosplenomegaly, no mass Vascular: No leg edema  Lab Results:  Lab Results  Component Value Date   WBC 5.4 03/12/2024   HGB 15.2 03/12/2024   HCT 49.0 03/12/2024   MCV 76.4 (L) 03/12/2024   PLT 133 (L) 03/12/2024   NEUTROABS 2.9 03/12/2024    CMP  Lab Results  Component Value Date   NA 141 02/27/2024   K 4.0 02/27/2024   CL 105 02/27/2024   CO2 28 02/27/2024   GLUCOSE 112 (H) 02/27/2024   BUN 12 02/27/2024   CREATININE 1.09 02/27/2024   CALCIUM  9.5 02/27/2024   PROT 7.1 02/27/2024   ALBUMIN 4.4 02/27/2024   AST 30 02/27/2024   ALT 29 02/27/2024   ALKPHOS 101 02/27/2024   BILITOT 0.5 02/27/2024   GFRNONAA >60 02/27/2024   GFRAA >60 05/08/2020    Lab Results  Component Value Date   CEA1 1.52 10/19/2020   CEA 2.09 08/21/2023     Medications: I have reviewed the patient's current medications.   Assessment/Plan: Colon cancer, sigmoid, stage IIIa (T1,N1), status post a partial colectomy 02/02/2017 1/26 lymph nodes positive MSI-stable, no loss of mismatch repair protein expression Staging CTs 12/29/2016-small indeterminate left lung nodules Normal preoperative CEA Cycle 1 CAPOX 03/03/2017 Cycle 2 CAPOX 03/24/2017 Cycle 3 CAPOX 04/14/2017 Cycle 4 CAPOX 05/05/2017 CTs 11/06/2018- negative for recurrent disease, small  left lung nodule stable from 2007-consider benign Colonoscopy 05/29/2019-negative CTs 10/25/2019-mild increase in a 1 cm small bowel mesenteric lymph node, no evidence of recurrent disease CTs 10/20/2020-several small left-sided lung nodule stable when compared to prior CTs.  No evidence of metastatic disease in the abdomen or pelvis.  Diffuse fatty infiltration of the liver.     2.  Indeterminate left lung nodules on a chest CT 12/29/2016, stable lung nodules on CT 10/25/2019   3.   Chronic red cell microcytosis   4.   Diabetes   5.   Hypertension   6.   Family history of colon cancer   7.   Multiple polyps noted on the colonoscopy 12/19/2016   8.   Neutropenia secondary to chemotherapy   9.   Extensive peripheral adenopathy on exam 08/21/2023-CTs neck through pelvis 08/24/2023 with extensive lymphadenopathy neck, chest, abdomen and pelvis.  Scattered tiny lung nodule stable from prior examination and compatible with a benign finding. 09/08/2023-right axillary lymph node biopsy-necrotic lymph node 09/22/2023-left axillary lymph node biopsy-75% of lymphocytes with CD5/CD200 positive lambda restricted on flow cytometry, surgical otology with a CD5 positive B-cell lymphoma, CD20, PAX5, cyclin D1 negative, perforation right 5-10%, CLL/SLL favored Cycle 1 day 1 obinutuzumab  10/03/2023, day 2 10/04/2023, day 8 10/09/2023, day 15 10/16/2023 Venetoclax  10/29/2023, ramp-up schedule Obinutuzumab  and venetoclax  placed on hold 10/31/2023 Venetoclax  resumed 11/03/2023 Cycle 2 obinutuzumab  11/07/2023 Venetoclax  dose escalated to 100 mg daily 11/26/2023 Cycle 3 obinutuzumab  12/05/2023,  venetoclax  continued 100 mg daily Cycle 4 obinutuzumab  01/02/2024, venetoclax  continued 100 mg daily secondary to neutropenia Cycle 5 obinutuzumab  01/30/2024, venetoclax  continued 100 mg daily secondary to neutropenia Cycle 6 obinutuzumab  02/27/2024, venetoclax  increased to 200 mg daily   10.  Positive hepatitis B core antibody and  negative surface antigen 09/28/2023-repeat labs in 4 weeks. 11.  Herpetic lip lesions 10/09/2023-Valtrex   12.  Admission 11/02/2023 with somnolence        Disposition: [name] is in clinical remission from CLL.  He is tolerating the venetoclax  well.  He has completed 6 months of obinutuzumab .  He will continue treatment with single agent venetoclax .  We will dose escalate the venetoclax  to 300 mg daily if the CBC remains adequate when he returns next month.  He will return for an office and lab visit in 4 weeks.  Coni Deep, MD  03/26/2024  8:14 AM

## 2024-04-10 ENCOUNTER — Other Ambulatory Visit: Payer: Self-pay

## 2024-04-21 ENCOUNTER — Other Ambulatory Visit: Payer: Self-pay | Admitting: Oncology

## 2024-04-23 ENCOUNTER — Inpatient Hospital Stay (HOSPITAL_BASED_OUTPATIENT_CLINIC_OR_DEPARTMENT_OTHER): Payer: Medicare (Managed Care) | Admitting: Nurse Practitioner

## 2024-04-23 ENCOUNTER — Inpatient Hospital Stay: Payer: Medicare (Managed Care) | Attending: Oncology

## 2024-04-23 ENCOUNTER — Encounter: Payer: Self-pay | Admitting: Nurse Practitioner

## 2024-04-23 ENCOUNTER — Telehealth: Payer: Self-pay | Admitting: Nurse Practitioner

## 2024-04-23 ENCOUNTER — Ambulatory Visit: Payer: Medicare (Managed Care)

## 2024-04-23 VITALS — BP 132/74 | HR 76 | Temp 98.6°F | Resp 17 | Ht 70.0 in | Wt 250.1 lb

## 2024-04-23 DIAGNOSIS — G620 Drug-induced polyneuropathy: Secondary | ICD-10-CM | POA: Diagnosis not present

## 2024-04-23 DIAGNOSIS — E119 Type 2 diabetes mellitus without complications: Secondary | ICD-10-CM | POA: Insufficient documentation

## 2024-04-23 DIAGNOSIS — R112 Nausea with vomiting, unspecified: Secondary | ICD-10-CM | POA: Insufficient documentation

## 2024-04-23 DIAGNOSIS — C8300 Small cell B-cell lymphoma, unspecified site: Secondary | ICD-10-CM

## 2024-04-23 DIAGNOSIS — M545 Low back pain, unspecified: Secondary | ICD-10-CM | POA: Insufficient documentation

## 2024-04-23 DIAGNOSIS — Z7969 Long term (current) use of other immunomodulators and immunosuppressants: Secondary | ICD-10-CM | POA: Diagnosis not present

## 2024-04-23 DIAGNOSIS — I10 Essential (primary) hypertension: Secondary | ICD-10-CM | POA: Insufficient documentation

## 2024-04-23 DIAGNOSIS — T451X5A Adverse effect of antineoplastic and immunosuppressive drugs, initial encounter: Secondary | ICD-10-CM | POA: Insufficient documentation

## 2024-04-23 DIAGNOSIS — Z79899 Other long term (current) drug therapy: Secondary | ICD-10-CM | POA: Diagnosis not present

## 2024-04-23 DIAGNOSIS — C9110 Chronic lymphocytic leukemia of B-cell type not having achieved remission: Secondary | ICD-10-CM | POA: Insufficient documentation

## 2024-04-23 DIAGNOSIS — Z85038 Personal history of other malignant neoplasm of large intestine: Secondary | ICD-10-CM | POA: Insufficient documentation

## 2024-04-23 LAB — CBC WITH DIFFERENTIAL (CANCER CENTER ONLY)
Abs Immature Granulocytes: 0.04 10*3/uL (ref 0.00–0.07)
Basophils Absolute: 0 10*3/uL (ref 0.0–0.1)
Basophils Relative: 0 %
Eosinophils Absolute: 0 10*3/uL (ref 0.0–0.5)
Eosinophils Relative: 1 %
HCT: 50 % (ref 39.0–52.0)
Hemoglobin: 15.3 g/dL (ref 13.0–17.0)
Immature Granulocytes: 1 %
Lymphocytes Relative: 41 %
Lymphs Abs: 1.3 10*3/uL (ref 0.7–4.0)
MCH: 23.9 pg — ABNORMAL LOW (ref 26.0–34.0)
MCHC: 30.6 g/dL (ref 30.0–36.0)
MCV: 78 fL — ABNORMAL LOW (ref 80.0–100.0)
Monocytes Absolute: 0.8 10*3/uL (ref 0.1–1.0)
Monocytes Relative: 27 %
Neutro Abs: 0.9 10*3/uL — ABNORMAL LOW (ref 1.7–7.7)
Neutrophils Relative %: 30 %
Platelet Count: 130 10*3/uL — ABNORMAL LOW (ref 150–400)
RBC: 6.41 MIL/uL — ABNORMAL HIGH (ref 4.22–5.81)
RDW: 18.5 % — ABNORMAL HIGH (ref 11.5–15.5)
Smear Review: ADEQUATE
WBC Count: 3.2 10*3/uL — ABNORMAL LOW (ref 4.0–10.5)
nRBC: 0 % (ref 0.0–0.2)

## 2024-04-23 LAB — URIC ACID: Uric Acid, Serum: 3.7 mg/dL (ref 3.7–8.6)

## 2024-04-23 LAB — CMP (CANCER CENTER ONLY)
ALT: 57 U/L — ABNORMAL HIGH (ref 0–44)
AST: 46 U/L — ABNORMAL HIGH (ref 15–41)
Albumin: 4.4 g/dL (ref 3.5–5.0)
Alkaline Phosphatase: 126 U/L (ref 38–126)
Anion gap: 12 (ref 5–15)
BUN: 15 mg/dL (ref 8–23)
CO2: 26 mmol/L (ref 22–32)
Calcium: 9.7 mg/dL (ref 8.9–10.3)
Chloride: 103 mmol/L (ref 98–111)
Creatinine: 1.18 mg/dL (ref 0.61–1.24)
GFR, Estimated: >60 mL/min (ref >60)
Glucose, Bld: 153 mg/dL — ABNORMAL HIGH (ref 70–99)
Potassium: 4 mmol/L (ref 3.5–5.1)
Sodium: 140 mmol/L (ref 135–145)
Total Bilirubin: 0.6 mg/dL (ref 0.0–1.2)
Total Protein: 7.2 g/dL (ref 6.5–8.1)

## 2024-04-23 LAB — LACTATE DEHYDROGENASE: LDH: 213 U/L — ABNORMAL HIGH (ref 98–192)

## 2024-04-23 NOTE — Progress Notes (Signed)
 [name] OFFICE PROGRESS NOTE   Diagnosis: CLL  INTERVAL HISTORY:   Angel [name] returns as scheduled.  He reports increasing venetoclax  to 300 mg daily following the last office visit.  He has had a few episodes of nausea/vomiting.  No mouth sores.  Recent loose stools.  He took an antidiarrheal.  No rash.  He reports a recent visit with Dr. Joice Nares for evaluation of right lower back pain.  The pain persists.  No weakness, numbness or tingling in the legs.  No bowel or bladder dysfunction.  Objective:  Vital signs in last 24 hours:  Blood pressure 132/74, pulse 76, temperature 98.6 F (37 C), temperature source Temporal, resp. rate 17, height 5\' 10"  (1.778 m), weight 250 lb 1.6 oz (113.4 kg), SpO2 98%.    HEENT: No thrush or ulcers. Lymphatics: No palpable cervical, supraclavicular or axillary lymph nodes. Resp: Lungs clear bilaterally. Cardio: Regular rate and rhythm. GI: No hepatosplenomegaly.  Nontender.  No mass. Vascular: No leg edema. Neuro: Lower extremity motor strength is intact. Skin: No rash.   Lab Results:  Lab Results  Component Value Date   WBC 3.2 (L) 04/23/2024   HGB 15.3 04/23/2024   HCT 50.0 04/23/2024   MCV 78.0 (L) 04/23/2024   PLT 130 (L) 04/23/2024   NEUTROABS 0.9 (L) 04/23/2024    Imaging:  No results found.  Medications: I have reviewed the patient's current medications.  Assessment/Plan:   Disposition:  Colon cancer, sigmoid, stage IIIa (T1,N1), status post a partial colectomy 02/02/2017 1/26 lymph nodes positive MSI-stable, no loss of mismatch repair protein expression Staging CTs 12/29/2016-small indeterminate left lung nodules Normal preoperative CEA Cycle 1 CAPOX 03/03/2017 Cycle 2 CAPOX 03/24/2017 Cycle 3 CAPOX 04/14/2017 Cycle 4 CAPOX 05/05/2017 CTs 11/06/2018- negative for recurrent disease, small left lung nodule stable from 2007-consider benign Colonoscopy 05/29/2019-negative CTs 10/25/2019-mild increase  in a 1 cm small bowel mesenteric lymph node, no evidence of recurrent disease CTs 10/20/2020-several small left-sided lung nodule stable when compared to prior CTs.  No evidence of metastatic disease in the abdomen or pelvis.  Diffuse fatty infiltration of the liver.     2.  Indeterminate left lung nodules on a chest CT 12/29/2016, stable lung nodules on CT 10/25/2019   3.   Chronic red cell microcytosis   4.   Diabetes   5.   Hypertension   6.   Family history of colon cancer   7.   Multiple polyps noted on the colonoscopy 12/19/2016   8.   Neutropenia secondary to chemotherapy   9.   Extensive peripheral adenopathy on exam 08/21/2023-CTs neck through pelvis 08/24/2023 with extensive lymphadenopathy neck, chest, abdomen and pelvis.  Scattered tiny lung nodule stable from prior examination and compatible with a benign finding. 09/08/2023-right axillary lymph node biopsy-necrotic lymph node 09/22/2023-left axillary lymph node biopsy-75% of lymphocytes with CD5/CD200 positive lambda restricted on flow cytometry, surgical otology with a CD5 positive B-cell lymphoma, CD20, PAX5, cyclin D1 negative, perforation right 5-10%, CLL/SLL favored Cycle 1 day 1 obinutuzumab  10/03/2023, day 2 10/04/2023, day 8 10/09/2023, day 15 10/16/2023 Venetoclax  10/29/2023, ramp-up schedule Obinutuzumab  and venetoclax  placed on hold 10/31/2023 Venetoclax  resumed 11/03/2023 Cycle 2 obinutuzumab  11/07/2023 Venetoclax  dose escalated to 100 mg daily 11/26/2023 Cycle 3 obinutuzumab  12/05/2023, venetoclax  continued 100 mg daily Cycle 4 obinutuzumab  01/02/2024, venetoclax  continued 100 mg daily secondary to neutropenia Cycle 5 obinutuzumab  01/30/2024, venetoclax  continued 100 mg daily secondary to neutropenia Cycle 6 obinutuzumab  02/27/2024, venetoclax  increased to 200 mg daily  Venetoclax  increased to 300 mg daily 03/26/2024   10.  Positive hepatitis B core antibody and negative surface antigen 09/28/2023-repeat labs in 4  weeks. 11.  Herpetic lip lesions 10/09/2023-Valtrex   12.  Admission 11/02/2023 with somnolence   Angel [name] remains in clinical remission from CLL.  Venetoclax  dose was escalated to 300 mg daily approximately 4 weeks ago.  CBC from today shows moderate neutropenia.  Precautions reviewed.  He understands to contact the office with fever, chills, other signs of infection.  He will continue the current dose of venetoclax  for now and return for a follow-up CBC in 2 weeks.  If ANC declines to 0.5 or lower plan to begin posaconazole and Levaquin.  Venetoclax  would need to be dose reduced to 100 mg daily due to an interaction with posaconazole.  He will follow-up with Dr. Joice Nares regarding persistent right low back pain.  Lab and office visit in 4 weeks.  We are available to see him sooner if needed.    Diana Forster ANP/GNP-BC   04/23/2024  9:54 AM

## 2024-04-23 NOTE — Telephone Encounter (Signed)
 Patient has been scheduled for follow-up visit per 04/23/24 LOS.  LVM notifying pt of appt details, provided my direct number to pt if appt changes need to be made.

## 2024-04-26 ENCOUNTER — Other Ambulatory Visit (HOSPITAL_COMMUNITY): Payer: Self-pay

## 2024-04-26 ENCOUNTER — Other Ambulatory Visit: Payer: Self-pay

## 2024-04-29 ENCOUNTER — Other Ambulatory Visit: Payer: Self-pay | Admitting: Oncology

## 2024-04-29 ENCOUNTER — Other Ambulatory Visit (HOSPITAL_COMMUNITY): Payer: Self-pay

## 2024-04-29 ENCOUNTER — Other Ambulatory Visit: Payer: Self-pay

## 2024-04-29 MED ORDER — VENETOCLAX 100 MG PO TABS
300.0000 mg | ORAL_TABLET | Freq: Every day | ORAL | 0 refills | Status: DC
  Filled 2024-04-29: qty 84, 28d supply, fill #0

## 2024-04-29 NOTE — Progress Notes (Signed)
 Specialty Pharmacy Refill Coordination Note  Angel [name] is a 73 y.o. male contacted today regarding refills of specialty medication(s) Venetoclax  (VENCLEXTA )   Patient requested [name] at [facility] at [place] date: 04/30/24   Medication will be filled on 04/29/24. This fill date is pending response to refill request from provider. Patient is aware and if they have not received fill by intended date they must follow up with pharmacy.   Patient reports taking 300 mg per day at this time, faxed MD's office to request directions be updated.  He was informed that we are waiting on the prescription to be sent over before we can fill and we will call with updated pickup date, once available.

## 2024-04-29 NOTE — Progress Notes (Signed)
 Specialty Pharmacy Ongoing Clinical Assessment Note  DANFORD TAT is a 73 y.o. male who is being followed by the specialty pharmacy service for RxSp Oncology   Patient's specialty medication(s) reviewed today: Venetoclax  (VENCLEXTA )   Missed doses in the last 4 weeks: 0   Patient/Caregiver did not have any additional questions or concerns.   Therapeutic benefit summary: Patient is achieving benefit   Adverse events/side effects summary: Experienced adverse events/side effects (leukopenia (provider is monitoring), and nausea (Compazine  is helping))   Patient's therapy is appropriate to: Continue    Goals Addressed             This Visit's Progress    Achieve or maintain remission   On track    Patient is on track. Patient will maintain adherence.  Per office visit note from 04/23/24, patient remains in clinical remission from CLL.           Follow up: 6 months  Malachi Screws Specialty Pharmacist

## 2024-04-30 ENCOUNTER — Other Ambulatory Visit (HOSPITAL_COMMUNITY): Payer: Self-pay

## 2024-05-07 ENCOUNTER — Inpatient Hospital Stay: Payer: Medicare (Managed Care) | Attending: Oncology

## 2024-05-07 ENCOUNTER — Telehealth: Payer: Self-pay

## 2024-05-07 DIAGNOSIS — D701 Agranulocytosis secondary to cancer chemotherapy: Secondary | ICD-10-CM | POA: Diagnosis not present

## 2024-05-07 DIAGNOSIS — M545 Low back pain, unspecified: Secondary | ICD-10-CM | POA: Diagnosis not present

## 2024-05-07 DIAGNOSIS — I10 Essential (primary) hypertension: Secondary | ICD-10-CM | POA: Diagnosis not present

## 2024-05-07 DIAGNOSIS — Z7969 Long term (current) use of other immunomodulators and immunosuppressants: Secondary | ICD-10-CM | POA: Insufficient documentation

## 2024-05-07 DIAGNOSIS — C8300 Small cell B-cell lymphoma, unspecified site: Secondary | ICD-10-CM

## 2024-05-07 DIAGNOSIS — E119 Type 2 diabetes mellitus without complications: Secondary | ICD-10-CM | POA: Insufficient documentation

## 2024-05-07 DIAGNOSIS — Z79899 Other long term (current) drug therapy: Secondary | ICD-10-CM | POA: Insufficient documentation

## 2024-05-07 DIAGNOSIS — C9110 Chronic lymphocytic leukemia of B-cell type not having achieved remission: Secondary | ICD-10-CM | POA: Insufficient documentation

## 2024-05-07 DIAGNOSIS — R197 Diarrhea, unspecified: Secondary | ICD-10-CM | POA: Insufficient documentation

## 2024-05-07 DIAGNOSIS — G8929 Other chronic pain: Secondary | ICD-10-CM | POA: Insufficient documentation

## 2024-05-07 DIAGNOSIS — R718 Other abnormality of red blood cells: Secondary | ICD-10-CM | POA: Insufficient documentation

## 2024-05-07 DIAGNOSIS — K760 Fatty (change of) liver, not elsewhere classified: Secondary | ICD-10-CM | POA: Diagnosis not present

## 2024-05-07 DIAGNOSIS — R590 Localized enlarged lymph nodes: Secondary | ICD-10-CM | POA: Diagnosis not present

## 2024-05-07 DIAGNOSIS — Z860100 Personal history of colon polyps, unspecified: Secondary | ICD-10-CM | POA: Diagnosis not present

## 2024-05-07 DIAGNOSIS — Z800 Family history of malignant neoplasm of digestive organs: Secondary | ICD-10-CM | POA: Diagnosis not present

## 2024-05-07 DIAGNOSIS — T451X5A Adverse effect of antineoplastic and immunosuppressive drugs, initial encounter: Secondary | ICD-10-CM | POA: Diagnosis not present

## 2024-05-07 LAB — CBC WITH DIFFERENTIAL (CANCER CENTER ONLY)
Abs Immature Granulocytes: 0.05 10*3/uL (ref 0.00–0.07)
Basophils Absolute: 0 10*3/uL (ref 0.0–0.1)
Basophils Relative: 0 %
Eosinophils Absolute: 0.1 10*3/uL (ref 0.0–0.5)
Eosinophils Relative: 1 %
HCT: 49.5 % (ref 39.0–52.0)
Hemoglobin: 15.6 g/dL (ref 13.0–17.0)
Immature Granulocytes: 1 %
Lymphocytes Relative: 38 %
Lymphs Abs: 1.8 10*3/uL (ref 0.7–4.0)
MCH: 24.4 pg — ABNORMAL LOW (ref 26.0–34.0)
MCHC: 31.5 g/dL (ref 30.0–36.0)
MCV: 77.3 fL — ABNORMAL LOW (ref 80.0–100.0)
Monocytes Absolute: 0.8 10*3/uL (ref 0.1–1.0)
Monocytes Relative: 18 %
Neutro Abs: 2 10*3/uL (ref 1.7–7.7)
Neutrophils Relative %: 42 %
Platelet Count: 134 10*3/uL — ABNORMAL LOW (ref 150–400)
RBC: 6.4 MIL/uL — ABNORMAL HIGH (ref 4.22–5.81)
RDW: 18 % — ABNORMAL HIGH (ref 11.5–15.5)
WBC Count: 4.7 10*3/uL (ref 4.0–10.5)
nRBC: 0 % (ref 0.0–0.2)

## 2024-05-07 NOTE — Telephone Encounter (Signed)
 Patient gave verbal understanding and had no further questions or concerns

## 2024-05-07 NOTE — Telephone Encounter (Signed)
-----   Message from Diana Forster sent at 05/07/2024  4:25 PM EDT ----- Please let him know the neutrophil count is better.  Continue venetoclax  at the current dose.  Follow-up as scheduled.

## 2024-05-17 ENCOUNTER — Other Ambulatory Visit: Payer: Self-pay

## 2024-05-17 ENCOUNTER — Other Ambulatory Visit: Payer: Self-pay | Admitting: Pharmacy Technician

## 2024-05-17 ENCOUNTER — Other Ambulatory Visit: Payer: Self-pay | Admitting: Oncology

## 2024-05-17 NOTE — Progress Notes (Signed)
 05/17/24 Started outreach while speaking with patient before patient mentioned he have a full unopened bottle.  Will not be refilling for month of June. Will re-time planned date on 06/04/24.  If patient need it sooner or run out; will callback.

## 2024-05-23 ENCOUNTER — Inpatient Hospital Stay (HOSPITAL_BASED_OUTPATIENT_CLINIC_OR_DEPARTMENT_OTHER): Payer: Medicare (Managed Care) | Admitting: Oncology

## 2024-05-23 ENCOUNTER — Inpatient Hospital Stay: Payer: Medicare (Managed Care)

## 2024-05-23 ENCOUNTER — Encounter: Payer: Self-pay | Admitting: Oncology

## 2024-05-23 VITALS — BP 124/69 | HR 90 | Temp 97.8°F | Resp 18 | Ht 70.0 in | Wt 248.2 lb

## 2024-05-23 DIAGNOSIS — C8300 Small cell B-cell lymphoma, unspecified site: Secondary | ICD-10-CM

## 2024-05-23 DIAGNOSIS — C9110 Chronic lymphocytic leukemia of B-cell type not having achieved remission: Secondary | ICD-10-CM | POA: Diagnosis not present

## 2024-05-23 LAB — CBC WITH DIFFERENTIAL (CANCER CENTER ONLY)
Abs Immature Granulocytes: 0.03 10*3/uL (ref 0.00–0.07)
Basophils Absolute: 0 10*3/uL (ref 0.0–0.1)
Basophils Relative: 0 %
Eosinophils Absolute: 0.1 10*3/uL (ref 0.0–0.5)
Eosinophils Relative: 1 %
HCT: 49.8 % (ref 39.0–52.0)
Hemoglobin: 15.9 g/dL (ref 13.0–17.0)
Immature Granulocytes: 1 %
Lymphocytes Relative: 45 %
Lymphs Abs: 1.7 10*3/uL (ref 0.7–4.0)
MCH: 24.5 pg — ABNORMAL LOW (ref 26.0–34.0)
MCHC: 31.9 g/dL (ref 30.0–36.0)
MCV: 76.9 fL — ABNORMAL LOW (ref 80.0–100.0)
Monocytes Absolute: 1.1 10*3/uL — ABNORMAL HIGH (ref 0.1–1.0)
Monocytes Relative: 30 %
Neutro Abs: 0.9 10*3/uL — ABNORMAL LOW (ref 1.7–7.7)
Neutrophils Relative %: 23 %
Platelet Count: 141 10*3/uL — ABNORMAL LOW (ref 150–400)
RBC: 6.48 MIL/uL — ABNORMAL HIGH (ref 4.22–5.81)
RDW: 17.9 % — ABNORMAL HIGH (ref 11.5–15.5)
WBC Count: 3.8 10*3/uL — ABNORMAL LOW (ref 4.0–10.5)
nRBC: 0 % (ref 0.0–0.2)

## 2024-05-23 LAB — CMP (CANCER CENTER ONLY)
ALT: 43 U/L (ref 0–44)
AST: 44 U/L — ABNORMAL HIGH (ref 15–41)
Albumin: 4.5 g/dL (ref 3.5–5.0)
Alkaline Phosphatase: 139 U/L — ABNORMAL HIGH (ref 38–126)
Anion gap: 9 (ref 5–15)
BUN: 22 mg/dL (ref 8–23)
CO2: 27 mmol/L (ref 22–32)
Calcium: 9.7 mg/dL (ref 8.9–10.3)
Chloride: 102 mmol/L (ref 98–111)
Creatinine: 1.31 mg/dL — ABNORMAL HIGH (ref 0.61–1.24)
GFR, Estimated: 59 mL/min — ABNORMAL LOW (ref >60)
Glucose, Bld: 133 mg/dL — ABNORMAL HIGH (ref 70–99)
Potassium: 4.1 mmol/L (ref 3.5–5.1)
Sodium: 137 mmol/L (ref 135–145)
Total Bilirubin: 0.7 mg/dL (ref 0.0–1.2)
Total Protein: 7.5 g/dL (ref 6.5–8.1)

## 2024-05-23 LAB — LACTATE DEHYDROGENASE: LDH: 244 U/L — ABNORMAL HIGH (ref 98–192)

## 2024-05-23 NOTE — Progress Notes (Signed)
 [facility] OFFICE PROGRESS NOTE   Diagnosis: CLL  INTERVAL HISTORY:   Angel [name] returns as scheduled.  He continues venetoclax  at a dose of 300 mg daily.  He had 1 episode of diarrhea last night.  He took Imodium.  Good appetite.  He has chronic low back pain.  Methocarbamol  helps.  Objective:  Vital signs in last 24 hours:  Blood pressure 124/69, pulse 90, temperature 97.8 F (36.6 C), temperature source Temporal, resp. rate 18, height 5' 10 (1.778 m), weight 248 lb 3.2 oz (112.6 kg), SpO2 98%.    HEENT: No thrush or ulcers Lymphatics: No cervical, supraclavicular, axillary, or inguinal nodes Resp: Lungs clear bilaterally Cardio: Regular rate and rhythm GI: No mass, no hepatosplenomegaly, nontender Vascular: No leg edema  Lab Results:  Lab Results  Component Value Date   WBC 3.8 (L) 05/23/2024   HGB 15.9 05/23/2024   HCT 49.8 05/23/2024   MCV 76.9 (L) 05/23/2024   PLT 141 (L) 05/23/2024   NEUTROABS 0.9 (L) 05/23/2024    CMP  Lab Results  Component Value Date   NA 137 05/23/2024   K 4.1 05/23/2024   CL 102 05/23/2024   CO2 27 05/23/2024   GLUCOSE 133 (H) 05/23/2024   BUN 22 05/23/2024   CREATININE 1.31 (H) 05/23/2024   CALCIUM  9.7 05/23/2024   PROT 7.5 05/23/2024   ALBUMIN 4.5 05/23/2024   AST 44 (H) 05/23/2024   ALT 43 05/23/2024   ALKPHOS 139 (H) 05/23/2024   BILITOT 0.7 05/23/2024   GFRNONAA 59 (L) 05/23/2024   GFRAA >60 05/08/2020    Lab Results  Component Value Date   CEA1 1.52 10/19/2020   CEA 2.09 08/21/2023    Medications: I have reviewed the patient's current medications.   Assessment/Plan: Colon cancer, sigmoid, stage IIIa (T1,N1), status post a partial colectomy 02/02/2017 1/26 lymph nodes positive MSI-stable, no loss of mismatch repair protein expression Staging CTs 12/29/2016-small indeterminate left lung nodules Normal preoperative CEA Cycle 1 CAPOX 03/03/2017 Cycle 2 CAPOX 03/24/2017 Cycle 3 CAPOX  04/14/2017 Cycle 4 CAPOX 05/05/2017 CTs 11/06/2018- negative for recurrent disease, small left lung nodule stable from 2007-consider benign Colonoscopy 05/29/2019-negative CTs 10/25/2019-mild increase in a 1 cm small bowel mesenteric lymph node, no evidence of recurrent disease CTs 10/20/2020-several small left-sided lung nodule stable when compared to prior CTs.  No evidence of metastatic disease in the abdomen or pelvis.  Diffuse fatty infiltration of the liver.     2.  Indeterminate left lung nodules on a chest CT 12/29/2016, stable lung nodules on CT 10/25/2019   3.   Chronic red cell microcytosis   4.   Diabetes   5.   Hypertension   6.   Family history of colon cancer   7.   Multiple polyps noted on the colonoscopy 12/19/2016   8.   Neutropenia secondary to chemotherapy   9.   Extensive peripheral adenopathy on exam 08/21/2023-CTs neck through pelvis 08/24/2023 with extensive lymphadenopathy neck, chest, abdomen and pelvis.  Scattered tiny lung nodule stable from prior examination and compatible with a benign finding. 09/08/2023-right axillary lymph node biopsy-necrotic lymph node 09/22/2023-left axillary lymph node biopsy-75% of lymphocytes with CD5/CD200 positive lambda restricted on flow cytometry, surgical otology with a CD5 positive B-cell lymphoma, CD20, PAX5, cyclin D1 negative, perforation right 5-10%, CLL/SLL favored Cycle 1 day 1 obinutuzumab  10/03/2023, day 2 10/04/2023, day 8 10/09/2023, day 15 10/16/2023 Venetoclax  10/29/2023, ramp-up schedule Obinutuzumab  and venetoclax  placed on hold 10/31/2023 Venetoclax  resumed 11/03/2023  Cycle 2 obinutuzumab  11/07/2023 Venetoclax  dose escalated to 100 mg daily 11/26/2023 Cycle 3 obinutuzumab  12/05/2023, venetoclax  continued 100 mg daily Cycle 4 obinutuzumab  01/02/2024, venetoclax  continued 100 mg daily secondary to neutropenia Cycle 5 obinutuzumab  01/30/2024, venetoclax  continued 100 mg daily secondary to neutropenia Cycle 6 obinutuzumab   02/27/2024, venetoclax  increased to 200 mg daily Venetoclax  increased to 300 mg daily 03/26/2024   10.  Positive hepatitis B core antibody and negative surface antigen 09/28/2023-repeat labs in 4 weeks. 11.  Herpetic lip lesions 10/09/2023-Valtrex   12.  Admission 11/02/2023 with somnolence     Disposition: [name] appears well.  He is in clinical remission from the small lymphocytic lymphoma.  He is tolerating the venetoclax  well.  He will continue venetoclax  at a dose of 300 mg daily.  He has mild neutropenia.  He will call for a fever or symptoms of infection.  The creatinine is mildly elevated today.  He will discontinue meloxicam.  I encouraged him to increase fluid intake.  [name] will return for a lab visit in 2 weeks and an office visit in 4 weeks.  [name], MD  05/23/2024  9:19 AM

## 2024-05-27 ENCOUNTER — Other Ambulatory Visit: Payer: Self-pay

## 2024-05-27 ENCOUNTER — Other Ambulatory Visit: Payer: Self-pay | Admitting: Oncology

## 2024-05-27 ENCOUNTER — Other Ambulatory Visit: Payer: Self-pay | Admitting: Pharmacy Technician

## 2024-05-27 MED ORDER — VENETOCLAX 100 MG PO TABS
300.0000 mg | ORAL_TABLET | Freq: Every day | ORAL | 0 refills | Status: DC
  Filled 2024-05-27: qty 84, 28d supply, fill #0

## 2024-05-27 NOTE — Progress Notes (Signed)
 Specialty Pharmacy Refill Coordination Note  Angel [name] is a 73 y.o. male contacted today regarding refills of specialty medication(s) Venetoclax  (VENCLEXTA )   Patient requested [name] at [facility] at [place] date: 05/28/24   Medication will be filled on 05/27/24 or when MD approve.  This fill date is pending response to refill request from provider. Patient is aware and if they have not received fill by intended date they must follow up with pharmacy.

## 2024-06-01 ENCOUNTER — Other Ambulatory Visit (HOSPITAL_COMMUNITY): Payer: Self-pay

## 2024-06-01 ENCOUNTER — Other Ambulatory Visit: Payer: Self-pay

## 2024-06-03 ENCOUNTER — Other Ambulatory Visit (HOSPITAL_COMMUNITY): Payer: Self-pay

## 2024-06-03 ENCOUNTER — Other Ambulatory Visit: Payer: Self-pay

## 2024-06-03 NOTE — Progress Notes (Signed)
 Clinical Intervention Note  Clinical Intervention Notes: Patient reached out to on call service on the weekend about the pickup of his Venclexta . [name] took the call and advised patient at that time that the medication was at the pharmacy for pickup and he proceeded to pick it up on Saturday but had questions about the fact that he had missed 3 doses. Spoke to patient to counsel that while the 3 missed doses were not ideal, this is not detrimental to his treatment as he restarted on Saturday once he picked up. He will continue as prescribed and try not to miss additional doses.   Clinical Intervention Outcomes: Improved therapy adherence   Angel [name] Specialty Pharmacist

## 2024-06-06 ENCOUNTER — Ambulatory Visit: Payer: Self-pay | Admitting: Oncology

## 2024-06-06 ENCOUNTER — Inpatient Hospital Stay: Payer: Medicare (Managed Care) | Attending: Oncology

## 2024-06-06 DIAGNOSIS — Z85038 Personal history of other malignant neoplasm of large intestine: Secondary | ICD-10-CM | POA: Insufficient documentation

## 2024-06-06 DIAGNOSIS — Z860100 Personal history of colon polyps, unspecified: Secondary | ICD-10-CM | POA: Diagnosis not present

## 2024-06-06 DIAGNOSIS — T451X5A Adverse effect of antineoplastic and immunosuppressive drugs, initial encounter: Secondary | ICD-10-CM | POA: Insufficient documentation

## 2024-06-06 DIAGNOSIS — C8300 Small cell B-cell lymphoma, unspecified site: Secondary | ICD-10-CM

## 2024-06-06 DIAGNOSIS — R197 Diarrhea, unspecified: Secondary | ICD-10-CM | POA: Insufficient documentation

## 2024-06-06 DIAGNOSIS — I10 Essential (primary) hypertension: Secondary | ICD-10-CM | POA: Insufficient documentation

## 2024-06-06 DIAGNOSIS — Z79899 Other long term (current) drug therapy: Secondary | ICD-10-CM | POA: Diagnosis not present

## 2024-06-06 DIAGNOSIS — D701 Agranulocytosis secondary to cancer chemotherapy: Secondary | ICD-10-CM | POA: Diagnosis not present

## 2024-06-06 DIAGNOSIS — C9110 Chronic lymphocytic leukemia of B-cell type not having achieved remission: Secondary | ICD-10-CM | POA: Insufficient documentation

## 2024-06-06 DIAGNOSIS — E119 Type 2 diabetes mellitus without complications: Secondary | ICD-10-CM | POA: Insufficient documentation

## 2024-06-06 DIAGNOSIS — Z800 Family history of malignant neoplasm of digestive organs: Secondary | ICD-10-CM | POA: Insufficient documentation

## 2024-06-06 DIAGNOSIS — Z7969 Long term (current) use of other immunomodulators and immunosuppressants: Secondary | ICD-10-CM | POA: Diagnosis not present

## 2024-06-06 DIAGNOSIS — K760 Fatty (change of) liver, not elsewhere classified: Secondary | ICD-10-CM | POA: Insufficient documentation

## 2024-06-06 DIAGNOSIS — R718 Other abnormality of red blood cells: Secondary | ICD-10-CM | POA: Insufficient documentation

## 2024-06-06 LAB — CMP (CANCER CENTER ONLY)
ALT: 41 U/L (ref 0–44)
AST: 40 U/L (ref 15–41)
Albumin: 4.4 g/dL (ref 3.5–5.0)
Alkaline Phosphatase: 134 U/L — ABNORMAL HIGH (ref 38–126)
Anion gap: 11 (ref 5–15)
BUN: 21 mg/dL (ref 8–23)
CO2: 26 mmol/L (ref 22–32)
Calcium: 10 mg/dL (ref 8.9–10.3)
Chloride: 102 mmol/L (ref 98–111)
Creatinine: 1.3 mg/dL — ABNORMAL HIGH (ref 0.61–1.24)
GFR, Estimated: 59 mL/min — ABNORMAL LOW (ref >60)
Glucose, Bld: 134 mg/dL — ABNORMAL HIGH (ref 70–99)
Potassium: 4.2 mmol/L (ref 3.5–5.1)
Sodium: 139 mmol/L (ref 135–145)
Total Bilirubin: 0.5 mg/dL (ref 0.0–1.2)
Total Protein: 7.3 g/dL (ref 6.5–8.1)

## 2024-06-06 LAB — CBC WITH DIFFERENTIAL (CANCER CENTER ONLY)
Abs Immature Granulocytes: 0.01 K/uL (ref 0.00–0.07)
Basophils Absolute: 0 K/uL (ref 0.0–0.1)
Basophils Relative: 0 %
Eosinophils Absolute: 0.1 K/uL (ref 0.0–0.5)
Eosinophils Relative: 1 %
HCT: 49.9 % (ref 39.0–52.0)
Hemoglobin: 15.6 g/dL (ref 13.0–17.0)
Immature Granulocytes: 0 %
Lymphocytes Relative: 48 %
Lymphs Abs: 2 K/uL (ref 0.7–4.0)
MCH: 24.2 pg — ABNORMAL LOW (ref 26.0–34.0)
MCHC: 31.3 g/dL (ref 30.0–36.0)
MCV: 77.5 fL — ABNORMAL LOW (ref 80.0–100.0)
Monocytes Absolute: 1 K/uL (ref 0.1–1.0)
Monocytes Relative: 25 %
Neutro Abs: 1.1 K/uL — ABNORMAL LOW (ref 1.7–7.7)
Neutrophils Relative %: 26 %
Platelet Count: 144 K/uL — ABNORMAL LOW (ref 150–400)
RBC: 6.44 MIL/uL — ABNORMAL HIGH (ref 4.22–5.81)
RDW: 17.3 % — ABNORMAL HIGH (ref 11.5–15.5)
WBC Count: 4.1 K/uL (ref 4.0–10.5)
nRBC: 0 % (ref 0.0–0.2)

## 2024-06-06 LAB — URIC ACID: Uric Acid, Serum: 3.2 mg/dL — ABNORMAL LOW (ref 3.7–8.6)

## 2024-06-07 ENCOUNTER — Telehealth: Payer: Self-pay | Admitting: Oncology

## 2024-06-07 NOTE — Telephone Encounter (Signed)
 Notified of improvement in [facility]. Continue Venetoclax  at same dose and f/u as scheduled. Call for fever. He understands and agrees.

## 2024-06-07 NOTE — Telephone Encounter (Signed)
 Returning missed phone calls.

## 2024-06-18 ENCOUNTER — Other Ambulatory Visit: Payer: Self-pay

## 2024-06-18 ENCOUNTER — Other Ambulatory Visit: Payer: Self-pay | Admitting: Oncology

## 2024-06-18 MED ORDER — VENETOCLAX 100 MG PO TABS
300.0000 mg | ORAL_TABLET | Freq: Every day | ORAL | 0 refills | Status: DC
  Filled 2024-06-18 – 2024-06-19 (×2): qty 84, 28d supply, fill #0

## 2024-06-18 NOTE — Progress Notes (Signed)
 Specialty Pharmacy Refill Coordination Note  JIGAR ZIELKE is a 73 y.o. male contacted today regarding refills of specialty medication(s) Venetoclax  (VENCLEXTA )   Patient requested [name] at [facility] at [place] date: 06/24/24   Medication will be filled on 06/21/24.   This fill date is pending response to refill request from provider. Patient is aware and if they have not received fill by intended date they must follow up with pharmacy.

## 2024-06-19 ENCOUNTER — Other Ambulatory Visit (HOSPITAL_COMMUNITY): Payer: Self-pay

## 2024-06-19 ENCOUNTER — Other Ambulatory Visit: Payer: Self-pay

## 2024-06-19 ENCOUNTER — Telehealth: Payer: Self-pay | Admitting: Pharmacy Technician

## 2024-06-19 NOTE — Telephone Encounter (Signed)
 Oral Oncology Patient Advocate Encounter  Was successful in securing patient a $8000 grant from [facility] to provide copayment coverage for Venclexta .  This will keep the out of pocket expense at $0.     [employer] ID: [id number]   The billing information is as follows and has been shared with [facility].    RxBin: [id number] PCN: [id number] Member ID: [id number] Group ID: [id number] Dates of Eligibility: 05/20/2024 through 05/19/2025  Fund:  Chronic Lymphocytic Leukemia  Anjuli Gemmill (Patty) Chet Burnet, CPhT  [facility], [place], [place], [place], [place] Oral Chemotherapy Patient Advocate Phone: [phone]  Fax: [phone]

## 2024-06-20 ENCOUNTER — Other Ambulatory Visit: Payer: Self-pay

## 2024-06-20 ENCOUNTER — Encounter: Payer: Self-pay | Admitting: Nurse Practitioner

## 2024-06-20 ENCOUNTER — Inpatient Hospital Stay: Payer: Medicare (Managed Care)

## 2024-06-20 ENCOUNTER — Inpatient Hospital Stay (HOSPITAL_BASED_OUTPATIENT_CLINIC_OR_DEPARTMENT_OTHER): Payer: Medicare (Managed Care) | Admitting: Nurse Practitioner

## 2024-06-20 VITALS — BP 119/70 | HR 99 | Temp 98.2°F | Resp 18 | Ht 70.0 in | Wt 247.5 lb

## 2024-06-20 DIAGNOSIS — C8300 Small cell B-cell lymphoma, unspecified site: Secondary | ICD-10-CM | POA: Diagnosis not present

## 2024-06-20 DIAGNOSIS — C9110 Chronic lymphocytic leukemia of B-cell type not having achieved remission: Secondary | ICD-10-CM | POA: Diagnosis not present

## 2024-06-20 LAB — CMP (CANCER CENTER ONLY)
ALT: 50 U/L — ABNORMAL HIGH (ref 0–44)
AST: 45 U/L — ABNORMAL HIGH (ref 15–41)
Albumin: 4.5 g/dL (ref 3.5–5.0)
Alkaline Phosphatase: 133 U/L — ABNORMAL HIGH (ref 38–126)
Anion gap: 12 (ref 5–15)
BUN: 16 mg/dL (ref 8–23)
CO2: 23 mmol/L (ref 22–32)
Calcium: 9.8 mg/dL (ref 8.9–10.3)
Chloride: 106 mmol/L (ref 98–111)
Creatinine: 1.3 mg/dL — ABNORMAL HIGH (ref 0.61–1.24)
GFR, Estimated: 59 mL/min — ABNORMAL LOW (ref >60)
Glucose, Bld: 153 mg/dL — ABNORMAL HIGH (ref 70–99)
Potassium: 4.1 mmol/L (ref 3.5–5.1)
Sodium: 141 mmol/L (ref 135–145)
Total Bilirubin: 0.5 mg/dL (ref 0.0–1.2)
Total Protein: 7.5 g/dL (ref 6.5–8.1)

## 2024-06-20 LAB — CBC WITH DIFFERENTIAL (CANCER CENTER ONLY)
Abs Immature Granulocytes: 0.01 K/uL (ref 0.00–0.07)
Basophils Absolute: 0 K/uL (ref 0.0–0.1)
Basophils Relative: 0 %
Eosinophils Absolute: 0.1 K/uL (ref 0.0–0.5)
Eosinophils Relative: 2 %
HCT: 50.1 % (ref 39.0–52.0)
Hemoglobin: 15.8 g/dL (ref 13.0–17.0)
Immature Granulocytes: 0 %
Lymphocytes Relative: 34 %
Lymphs Abs: 1.4 K/uL (ref 0.7–4.0)
MCH: 24.6 pg — ABNORMAL LOW (ref 26.0–34.0)
MCHC: 31.5 g/dL (ref 30.0–36.0)
MCV: 77.9 fL — ABNORMAL LOW (ref 80.0–100.0)
Monocytes Absolute: 0.7 K/uL (ref 0.1–1.0)
Monocytes Relative: 19 %
Neutro Abs: 1.8 K/uL (ref 1.7–7.7)
Neutrophils Relative %: 45 %
Platelet Count: 140 K/uL — ABNORMAL LOW (ref 150–400)
RBC: 6.43 MIL/uL — ABNORMAL HIGH (ref 4.22–5.81)
RDW: 17.7 % — ABNORMAL HIGH (ref 11.5–15.5)
WBC Count: 4 K/uL (ref 4.0–10.5)
nRBC: 0 % (ref 0.0–0.2)

## 2024-06-20 LAB — LACTATE DEHYDROGENASE: LDH: 201 U/L — ABNORMAL HIGH (ref 98–192)

## 2024-06-20 LAB — HEPATITIS B SURFACE ANTIGEN: Hepatitis B Surface Ag: NONREACTIVE

## 2024-06-20 NOTE — Progress Notes (Signed)
 [name] OFFICE PROGRESS NOTE   Diagnosis: CLL  INTERVAL HISTORY:   Angel [name] returns as scheduled.  He continues venetoclax  300 mg daily.  He feels well.  He has a good appetite.  No fevers or sweats.  No nausea or vomiting.  No mouth sores.  Periodic loose stools.  No rash.  Objective:  Vital signs in last 24 hours:  Blood pressure 119/70, pulse 99, temperature 98.2 F (36.8 C), temperature source Temporal, resp. rate 18, height 5' 10 (1.778 m), weight 247 lb 8 oz (112.3 kg), SpO2 99%.    HEENT: No thrush or ulcers. Lymphatics: No palpable cervical, supraclavicular, axillary or inguinal lymph nodes. Resp: Lungs clear bilaterally. Cardio: Regular rate and rhythm. GI: No hepatosplenomegaly. Vascular: No leg edema.   Lab Results:  Lab Results  Component Value Date   WBC 4.0 06/20/2024   HGB 15.8 06/20/2024   HCT 50.1 06/20/2024   MCV 77.9 (L) 06/20/2024   PLT 140 (L) 06/20/2024   NEUTROABS 1.8 06/20/2024    Imaging:  No results found.  Medications: I have reviewed the patient's current medications.  Assessment/Plan: Colon cancer, sigmoid, stage IIIa (T1,N1), status post a partial colectomy 02/02/2017 1/26 lymph nodes positive MSI-stable, no loss of mismatch repair protein expression Staging CTs 12/29/2016-small indeterminate left lung nodules Normal preoperative CEA Cycle 1 CAPOX 03/03/2017 Cycle 2 CAPOX 03/24/2017 Cycle 3 CAPOX 04/14/2017 Cycle 4 CAPOX 05/05/2017 CTs 11/06/2018- negative for recurrent disease, small left lung nodule stable from 2007-consider benign Colonoscopy 05/29/2019-negative CTs 10/25/2019-mild increase in a 1 cm small bowel mesenteric lymph node, no evidence of recurrent disease CTs 10/20/2020-several small left-sided lung nodule stable when compared to prior CTs.  No evidence of metastatic disease in the abdomen or pelvis.  Diffuse fatty infiltration of the liver.     2.  Indeterminate left lung nodules on a  chest CT 12/29/2016, stable lung nodules on CT 10/25/2019   3.   Chronic red cell microcytosis   4.   Diabetes   5.   Hypertension   6.   Family history of colon cancer   7.   Multiple polyps noted on the colonoscopy 12/19/2016   8.   Neutropenia secondary to chemotherapy   9.   Extensive peripheral adenopathy on exam 08/21/2023-CTs neck through pelvis 08/24/2023 with extensive lymphadenopathy neck, chest, abdomen and pelvis.  Scattered tiny lung nodule stable from prior examination and compatible with a benign finding. 09/08/2023-right axillary lymph node biopsy-necrotic lymph node 09/22/2023-left axillary lymph node biopsy-75% of lymphocytes with CD5/CD200 positive lambda restricted on flow cytometry, surgical otology with a CD5 positive B-cell lymphoma, CD20, PAX5, cyclin D1 negative, perforation right 5-10%, CLL/SLL favored Cycle 1 day 1 obinutuzumab  10/03/2023, day 2 10/04/2023, day 8 10/09/2023, day 15 10/16/2023 Venetoclax  10/29/2023, ramp-up schedule Obinutuzumab  and venetoclax  placed on hold 10/31/2023 Venetoclax  resumed 11/03/2023 Cycle 2 obinutuzumab  11/07/2023 Venetoclax  dose escalated to 100 mg daily 11/26/2023 Cycle 3 obinutuzumab  12/05/2023, venetoclax  continued 100 mg daily Cycle 4 obinutuzumab  01/02/2024, venetoclax  continued 100 mg daily secondary to neutropenia Cycle 5 obinutuzumab  01/30/2024, venetoclax  continued 100 mg daily secondary to neutropenia Cycle 6 obinutuzumab  02/27/2024, venetoclax  increased to 200 mg daily Venetoclax  increased to 300 mg daily 03/26/2024   10.  Positive hepatitis B core antibody and negative surface antigen 09/28/2023-repeat labs in 4 weeks. 11.  Herpetic lip lesions 10/09/2023-Valtrex   12.  Admission 11/02/2023 with somnolence    Disposition: Angel [name] appears stable.  He remains in clinical remission from small lymphocytic lymphoma.  He will continue venetoclax  at a dose of 300 mg daily.  CBC and chemistry panel reviewed.  Labs adequate to  continue venetoclax  as above.  Stable mild elevation of creatinine.  He will return for lab and follow-up in 4 weeks.  We are available to see him sooner if needed.    Olam Ned ANP/GNP-BC   06/20/2024  9:02 AM

## 2024-06-21 ENCOUNTER — Other Ambulatory Visit: Payer: Self-pay

## 2024-07-05 ENCOUNTER — Other Ambulatory Visit: Payer: Self-pay | Admitting: Oncology

## 2024-07-05 DIAGNOSIS — C8300 Small cell B-cell lymphoma, unspecified site: Secondary | ICD-10-CM

## 2024-07-12 ENCOUNTER — Other Ambulatory Visit: Payer: Self-pay

## 2024-07-12 ENCOUNTER — Other Ambulatory Visit: Payer: Self-pay | Admitting: Pharmacy Technician

## 2024-07-12 ENCOUNTER — Other Ambulatory Visit: Payer: Self-pay | Admitting: Oncology

## 2024-07-12 MED ORDER — VENETOCLAX 100 MG PO TABS
300.0000 mg | ORAL_TABLET | Freq: Every day | ORAL | 0 refills | Status: DC
  Filled 2024-07-12: qty 84, 28d supply, fill #0

## 2024-07-12 NOTE — Progress Notes (Signed)
 Specialty Pharmacy Refill Coordination Note  Angel [name] is a 73 y.o. male contacted today regarding refills of specialty medication(s) Venetoclax  (VENCLEXTA )   Patient requested [name] at [facility] at [place] date: 07/17/24   Medication will be filled on 07/17/24.  This fill date is pending response to refill request from provider. Patient is aware and if they have not received fill by intended date they must follow up with pharmacy.

## 2024-07-18 ENCOUNTER — Inpatient Hospital Stay: Payer: Medicare (Managed Care) | Attending: Oncology

## 2024-07-18 ENCOUNTER — Inpatient Hospital Stay (HOSPITAL_BASED_OUTPATIENT_CLINIC_OR_DEPARTMENT_OTHER): Payer: Medicare (Managed Care) | Admitting: Oncology

## 2024-07-18 ENCOUNTER — Telehealth: Payer: Self-pay | Admitting: Oncology

## 2024-07-18 VITALS — BP 142/86 | HR 75 | Temp 98.4°F | Resp 18 | Ht 70.0 in | Wt 250.9 lb

## 2024-07-18 DIAGNOSIS — Z7969 Long term (current) use of other immunomodulators and immunosuppressants: Secondary | ICD-10-CM | POA: Diagnosis not present

## 2024-07-18 DIAGNOSIS — R718 Other abnormality of red blood cells: Secondary | ICD-10-CM | POA: Diagnosis not present

## 2024-07-18 DIAGNOSIS — Z860100 Personal history of colon polyps, unspecified: Secondary | ICD-10-CM | POA: Diagnosis not present

## 2024-07-18 DIAGNOSIS — R197 Diarrhea, unspecified: Secondary | ICD-10-CM | POA: Insufficient documentation

## 2024-07-18 DIAGNOSIS — K760 Fatty (change of) liver, not elsewhere classified: Secondary | ICD-10-CM | POA: Diagnosis not present

## 2024-07-18 DIAGNOSIS — C8300 Small cell B-cell lymphoma, unspecified site: Secondary | ICD-10-CM

## 2024-07-18 DIAGNOSIS — C9110 Chronic lymphocytic leukemia of B-cell type not having achieved remission: Secondary | ICD-10-CM | POA: Diagnosis present

## 2024-07-18 DIAGNOSIS — I10 Essential (primary) hypertension: Secondary | ICD-10-CM | POA: Insufficient documentation

## 2024-07-18 DIAGNOSIS — E119 Type 2 diabetes mellitus without complications: Secondary | ICD-10-CM | POA: Diagnosis not present

## 2024-07-18 DIAGNOSIS — D701 Agranulocytosis secondary to cancer chemotherapy: Secondary | ICD-10-CM | POA: Insufficient documentation

## 2024-07-18 DIAGNOSIS — Z79899 Other long term (current) drug therapy: Secondary | ICD-10-CM | POA: Diagnosis not present

## 2024-07-18 DIAGNOSIS — Z85038 Personal history of other malignant neoplasm of large intestine: Secondary | ICD-10-CM | POA: Diagnosis present

## 2024-07-18 DIAGNOSIS — Z800 Family history of malignant neoplasm of digestive organs: Secondary | ICD-10-CM | POA: Diagnosis not present

## 2024-07-18 DIAGNOSIS — T451X5A Adverse effect of antineoplastic and immunosuppressive drugs, initial encounter: Secondary | ICD-10-CM | POA: Insufficient documentation

## 2024-07-18 LAB — CBC WITH DIFFERENTIAL (CANCER CENTER ONLY)
Abs Immature Granulocytes: 0.04 K/uL (ref 0.00–0.07)
Basophils Absolute: 0 K/uL (ref 0.0–0.1)
Basophils Relative: 0 %
Eosinophils Absolute: 0.1 K/uL (ref 0.0–0.5)
Eosinophils Relative: 1 %
HCT: 49.6 % (ref 39.0–52.0)
Hemoglobin: 15.7 g/dL (ref 13.0–17.0)
Immature Granulocytes: 1 %
Lymphocytes Relative: 52 %
Lymphs Abs: 2.2 K/uL (ref 0.7–4.0)
MCH: 24.4 pg — ABNORMAL LOW (ref 26.0–34.0)
MCHC: 31.7 g/dL (ref 30.0–36.0)
MCV: 77 fL — ABNORMAL LOW (ref 80.0–100.0)
Monocytes Absolute: 0.7 K/uL (ref 0.1–1.0)
Monocytes Relative: 17 %
Neutro Abs: 1.2 K/uL — ABNORMAL LOW (ref 1.7–7.7)
Neutrophils Relative %: 29 %
Platelet Count: 130 K/uL — ABNORMAL LOW (ref 150–400)
RBC: 6.44 MIL/uL — ABNORMAL HIGH (ref 4.22–5.81)
RDW: 17.5 % — ABNORMAL HIGH (ref 11.5–15.5)
WBC Count: 4.3 K/uL (ref 4.0–10.5)
nRBC: 0 % (ref 0.0–0.2)

## 2024-07-18 LAB — CMP (CANCER CENTER ONLY)
ALT: 57 U/L — ABNORMAL HIGH (ref 0–44)
AST: 53 U/L — ABNORMAL HIGH (ref 15–41)
Albumin: 4.4 g/dL (ref 3.5–5.0)
Alkaline Phosphatase: 130 U/L — ABNORMAL HIGH (ref 38–126)
Anion gap: 12 (ref 5–15)
BUN: 15 mg/dL (ref 8–23)
CO2: 25 mmol/L (ref 22–32)
Calcium: 10 mg/dL (ref 8.9–10.3)
Chloride: 102 mmol/L (ref 98–111)
Creatinine: 1.19 mg/dL (ref 0.61–1.24)
GFR, Estimated: >60 mL/min (ref >60)
Glucose, Bld: 115 mg/dL — ABNORMAL HIGH (ref 70–99)
Potassium: 4.3 mmol/L (ref 3.5–5.1)
Sodium: 140 mmol/L (ref 135–145)
Total Bilirubin: 0.7 mg/dL (ref 0.0–1.2)
Total Protein: 7.2 g/dL (ref 6.5–8.1)

## 2024-07-18 LAB — URIC ACID: Uric Acid, Serum: 3.9 mg/dL (ref 3.7–8.6)

## 2024-07-18 LAB — LACTATE DEHYDROGENASE: LDH: 253 U/L — ABNORMAL HIGH (ref 98–192)

## 2024-07-18 NOTE — Telephone Encounter (Signed)
 Patient has been scheduled for follow-up visit per 07/17/24 LOS.  Pt noted appt details on personal electronic device.

## 2024-07-18 NOTE — Progress Notes (Signed)
 [facility] OFFICE PROGRESS NOTE   Diagnosis: Small lymphocytic lymphoma  INTERVAL HISTORY:   Angel [name] returns as scheduled.  He continues venetoclax .  He reports a left buccal mouth sore.  He has occasional diarrhea.  Good appetite.  No fever.  No palpable lymph nodes.  He had left cataract surgery on 07/15/2024.  Objective:  Vital signs in last 24 hours:  Blood pressure (!) 142/86, pulse 75, temperature 98.4 F (36.9 C), temperature source Temporal, resp. rate 18, height 5' 10 (1.778 m), weight 250 lb 14.4 oz (113.8 kg), SpO2 98%.    HEENT: No thrush or ulcers Lymphatics: No cervical, supraclavicular, axillary, or inguinal nodes Resp: Lungs clear bilaterally Cardio: Regular rate and rhythm GI: No hepatosplenomegaly Vascular: No leg edema  Lab Results:  Lab Results  Component Value Date   WBC 4.3 07/18/2024   HGB 15.7 07/18/2024   HCT 49.6 07/18/2024   MCV 77.0 (L) 07/18/2024   PLT 130 (L) 07/18/2024   NEUTROABS 1.2 (L) 07/18/2024    CMP  Lab Results  Component Value Date   NA 141 06/20/2024   K 4.1 06/20/2024   CL 106 06/20/2024   CO2 23 06/20/2024   GLUCOSE 153 (H) 06/20/2024   BUN 16 06/20/2024   CREATININE 1.30 (H) 06/20/2024   CALCIUM  9.8 06/20/2024   PROT 7.5 06/20/2024   ALBUMIN 4.5 06/20/2024   AST 45 (H) 06/20/2024   ALT 50 (H) 06/20/2024   ALKPHOS 133 (H) 06/20/2024   BILITOT 0.5 06/20/2024   GFRNONAA 59 (L) 06/20/2024   GFRAA >60 05/08/2020    Lab Results  Component Value Date   CEA1 1.52 10/19/2020   CEA 2.09 08/21/2023     Medications: I have reviewed the patient's current medications.   Assessment/Plan:  Colon cancer, sigmoid, stage IIIa (T1,N1), status post a partial colectomy 02/02/2017 1/26 lymph nodes positive MSI-stable, no loss of mismatch repair protein expression Staging CTs 12/29/2016-small indeterminate left lung nodules Normal preoperative CEA Cycle 1 CAPOX 03/03/2017 Cycle 2 CAPOX  03/24/2017 Cycle 3 CAPOX 04/14/2017 Cycle 4 CAPOX 05/05/2017 CTs 11/06/2018- negative for recurrent disease, small left lung nodule stable from 2007-consider benign Colonoscopy 05/29/2019-negative CTs 10/25/2019-mild increase in a 1 cm small bowel mesenteric lymph node, no evidence of recurrent disease CTs 10/20/2020-several small left-sided lung nodule stable when compared to prior CTs.  No evidence of metastatic disease in the abdomen or pelvis.  Diffuse fatty infiltration of the liver.     2.  Indeterminate left lung nodules on a chest CT 12/29/2016, stable lung nodules on CT 10/25/2019   3.   Chronic red cell microcytosis   4.   Diabetes   5.   Hypertension   6.   Family history of colon cancer   7.   Multiple polyps noted on the colonoscopy 12/19/2016   8.   Neutropenia secondary to chemotherapy   9.   Extensive peripheral adenopathy on exam 08/21/2023-CTs neck through pelvis 08/24/2023 with extensive lymphadenopathy neck, chest, abdomen and pelvis.  Scattered tiny lung nodule stable from prior examination and compatible with a benign finding. 09/08/2023-right axillary lymph node biopsy-necrotic lymph node 09/22/2023-left axillary lymph node biopsy-75% of lymphocytes with CD5/CD200 positive lambda restricted on flow cytometry, surgical otology with a CD5 positive B-cell lymphoma, CD20, PAX5, cyclin D1 negative, perforation right 5-10%, CLL/SLL favored Cycle 1 day 1 obinutuzumab  10/03/2023, day 2 10/04/2023, day 8 10/09/2023, day 15 10/16/2023 Venetoclax  10/29/2023, ramp-up schedule Obinutuzumab  and venetoclax  placed on hold 10/31/2023 Venetoclax  resumed  11/03/2023 Cycle 2 obinutuzumab  11/07/2023 Venetoclax  dose escalated to 100 mg daily 11/26/2023 Cycle 3 obinutuzumab  12/05/2023, venetoclax  continued 100 mg daily Cycle 4 obinutuzumab  01/02/2024, venetoclax  continued 100 mg daily secondary to neutropenia Cycle 5 obinutuzumab  01/30/2024, venetoclax  continued 100 mg daily secondary to  neutropenia Cycle 6 obinutuzumab  02/27/2024, venetoclax  increased to 200 mg daily Venetoclax  increased to 300 mg daily 03/26/2024   10.  Positive hepatitis B core antibody and negative surface antigen 09/28/2023-repeat labs in 4 weeks. 11.  Herpetic lip lesions 10/09/2023-Valtrex   12.  Admission 11/02/2023 with somnolence     Disposition: Mr. Baltzell appears stable.  He has persistent mild neutropenia.  He will call for a fever or symptoms of an infection.  He is in clinical remission from CLL.  He will continue venetoclax .  He will return for an office and lab visit in 4 weeks.  The plan is to continue venetoclax  through November of this year.  [name], MD  07/18/2024  8:56 AM

## 2024-07-25 ENCOUNTER — Other Ambulatory Visit: Payer: Self-pay | Admitting: Oncology

## 2024-08-13 ENCOUNTER — Other Ambulatory Visit: Payer: Self-pay

## 2024-08-13 ENCOUNTER — Other Ambulatory Visit: Payer: Self-pay | Admitting: Oncology

## 2024-08-13 MED ORDER — VENETOCLAX 100 MG PO TABS
300.0000 mg | ORAL_TABLET | Freq: Every day | ORAL | 0 refills | Status: DC
  Filled 2024-08-13: qty 84, 28d supply, fill #0

## 2024-08-13 NOTE — Progress Notes (Signed)
 Specialty Pharmacy Refill Coordination Note  Angel [name] is a 73 y.o. male contacted today regarding refills of specialty medication(s) Venetoclax  (VENCLEXTA )   Patient requested [name] at [facility] at [place] date: 08/19/24   Medication will be filled on 09.19.25.   This fill date is pending response to refill request from provider. Patient is aware and if they have not received fill by intended date they must follow up with pharmacy.

## 2024-08-15 ENCOUNTER — Other Ambulatory Visit: Payer: Self-pay

## 2024-08-19 ENCOUNTER — Inpatient Hospital Stay (HOSPITAL_BASED_OUTPATIENT_CLINIC_OR_DEPARTMENT_OTHER): Payer: Medicare (Managed Care) | Admitting: Oncology

## 2024-08-19 ENCOUNTER — Inpatient Hospital Stay: Payer: Medicare (Managed Care) | Attending: Oncology

## 2024-08-19 VITALS — BP 140/79 | HR 68 | Temp 97.8°F | Resp 18 | Ht 70.0 in | Wt 248.9 lb

## 2024-08-19 DIAGNOSIS — C8300 Small cell B-cell lymphoma, unspecified site: Secondary | ICD-10-CM

## 2024-08-19 DIAGNOSIS — C9110 Chronic lymphocytic leukemia of B-cell type not having achieved remission: Secondary | ICD-10-CM | POA: Diagnosis present

## 2024-08-19 DIAGNOSIS — E119 Type 2 diabetes mellitus without complications: Secondary | ICD-10-CM | POA: Diagnosis not present

## 2024-08-19 DIAGNOSIS — D701 Agranulocytosis secondary to cancer chemotherapy: Secondary | ICD-10-CM | POA: Diagnosis not present

## 2024-08-19 DIAGNOSIS — Z860100 Personal history of colon polyps, unspecified: Secondary | ICD-10-CM | POA: Insufficient documentation

## 2024-08-19 DIAGNOSIS — T451X5A Adverse effect of antineoplastic and immunosuppressive drugs, initial encounter: Secondary | ICD-10-CM | POA: Diagnosis not present

## 2024-08-19 DIAGNOSIS — I10 Essential (primary) hypertension: Secondary | ICD-10-CM | POA: Diagnosis not present

## 2024-08-19 DIAGNOSIS — Z85038 Personal history of other malignant neoplasm of large intestine: Secondary | ICD-10-CM | POA: Insufficient documentation

## 2024-08-19 DIAGNOSIS — R718 Other abnormality of red blood cells: Secondary | ICD-10-CM | POA: Diagnosis not present

## 2024-08-19 DIAGNOSIS — Z800 Family history of malignant neoplasm of digestive organs: Secondary | ICD-10-CM | POA: Insufficient documentation

## 2024-08-19 LAB — CMP (CANCER CENTER ONLY)
ALT: 49 U/L — ABNORMAL HIGH (ref 0–44)
AST: 46 U/L — ABNORMAL HIGH (ref 15–41)
Albumin: 4.7 g/dL (ref 3.5–5.0)
Alkaline Phosphatase: 127 U/L — ABNORMAL HIGH (ref 38–126)
Anion gap: 11 (ref 5–15)
BUN: 13 mg/dL (ref 8–23)
CO2: 25 mmol/L (ref 22–32)
Calcium: 9.6 mg/dL (ref 8.9–10.3)
Chloride: 103 mmol/L (ref 98–111)
Creatinine: 1.11 mg/dL (ref 0.61–1.24)
GFR, Estimated: >60 mL/min (ref >60)
Glucose, Bld: 95 mg/dL (ref 70–99)
Potassium: 3.8 mmol/L (ref 3.5–5.1)
Sodium: 140 mmol/L (ref 135–145)
Total Bilirubin: 0.7 mg/dL (ref 0.0–1.2)
Total Protein: 7.3 g/dL (ref 6.5–8.1)

## 2024-08-19 LAB — CBC WITH DIFFERENTIAL (CANCER CENTER ONLY)
Abs Immature Granulocytes: 0.01 K/uL (ref 0.00–0.07)
Basophils Absolute: 0 K/uL (ref 0.0–0.1)
Basophils Relative: 0 %
Eosinophils Absolute: 0.1 K/uL (ref 0.0–0.5)
Eosinophils Relative: 1 %
HCT: 50.1 % (ref 39.0–52.0)
Hemoglobin: 15.7 g/dL (ref 13.0–17.0)
Immature Granulocytes: 0 %
Lymphocytes Relative: 47 %
Lymphs Abs: 2.2 K/uL (ref 0.7–4.0)
MCH: 24.6 pg — ABNORMAL LOW (ref 26.0–34.0)
MCHC: 31.3 g/dL (ref 30.0–36.0)
MCV: 78.5 fL — ABNORMAL LOW (ref 80.0–100.0)
Monocytes Absolute: 0.9 K/uL (ref 0.1–1.0)
Monocytes Relative: 19 %
Neutro Abs: 1.5 K/uL — ABNORMAL LOW (ref 1.7–7.7)
Neutrophils Relative %: 33 %
Platelet Count: 121 K/uL — ABNORMAL LOW (ref 150–400)
RBC: 6.38 MIL/uL — ABNORMAL HIGH (ref 4.22–5.81)
RDW: 18 % — ABNORMAL HIGH (ref 11.5–15.5)
WBC Count: 4.7 K/uL (ref 4.0–10.5)
nRBC: 0 % (ref 0.0–0.2)

## 2024-08-19 LAB — URIC ACID: Uric Acid, Serum: 3.2 mg/dL — ABNORMAL LOW (ref 3.7–8.6)

## 2024-08-19 LAB — LACTATE DEHYDROGENASE: LDH: 185 U/L (ref 98–192)

## 2024-08-19 NOTE — Progress Notes (Signed)
 [facility] OFFICE PROGRESS NOTE   Diagnosis: CLL  INTERVAL HISTORY:   Angel [name] returns as scheduled.  He continues venetoclax .  No fever or night sweats.  No new complaint.  He has intermittent diarrhea, relieved with an antidiarrhea medication.  Objective:  Vital signs in last 24 hours:  Blood pressure (!) 140/79, pulse 68, temperature 97.8 F (36.6 C), temperature source Temporal, resp. rate 18, height 5' 10 (1.778 m), weight 248 lb 14.4 oz (112.9 kg), SpO2 100%.    HEENT: No thrush or ulcers, mild white coat over the tongue Lymphatics: No cervical, supraclavicular, axillary, or inguinal nodes Resp: Lungs clear bilaterally Cardio: Regular rate and rhythm GI: No hepatosplenomegaly Vascular: No leg edema   Lab Results:  Lab Results  Component Value Date   WBC 4.7 08/19/2024   HGB 15.7 08/19/2024   HCT 50.1 08/19/2024   MCV 78.5 (L) 08/19/2024   PLT 121 (L) 08/19/2024   NEUTROABS 1.5 (L) 08/19/2024    CMP  Lab Results  Component Value Date   NA 140 07/18/2024   K 4.3 07/18/2024   CL 102 07/18/2024   CO2 25 07/18/2024   GLUCOSE 115 (H) 07/18/2024   BUN 15 07/18/2024   CREATININE 1.19 07/18/2024   CALCIUM  10.0 07/18/2024   PROT 7.2 07/18/2024   ALBUMIN 4.4 07/18/2024   AST 53 (H) 07/18/2024   ALT 57 (H) 07/18/2024   ALKPHOS 130 (H) 07/18/2024   BILITOT 0.7 07/18/2024   GFRNONAA >60 07/18/2024   GFRAA >60 05/08/2020    Lab Results  Component Value Date   CEA1 1.52 10/19/2020   CEA 2.09 08/21/2023    No results found for: INR, LABPROT  Imaging:  No results found.  Medications: I have reviewed the patient's current medications.   Assessment/Plan: Colon cancer, sigmoid, stage IIIa (T1,N1), status post a partial colectomy 02/02/2017 1/26 lymph nodes positive MSI-stable, no loss of mismatch repair protein expression Staging CTs 12/29/2016-small indeterminate left lung nodules Normal preoperative CEA Cycle 1 CAPOX  03/03/2017 Cycle 2 CAPOX 03/24/2017 Cycle 3 CAPOX 04/14/2017 Cycle 4 CAPOX 05/05/2017 CTs 11/06/2018- negative for recurrent disease, small left lung nodule stable from 2007-consider benign Colonoscopy 05/29/2019-negative CTs 10/25/2019-mild increase in a 1 cm small bowel mesenteric lymph node, no evidence of recurrent disease CTs 10/20/2020-several small left-sided lung nodule stable when compared to prior CTs.  No evidence of metastatic disease in the abdomen or pelvis.  Diffuse fatty infiltration of the liver.     2.  Indeterminate left lung nodules on a chest CT 12/29/2016, stable lung nodules on CT 10/25/2019   3.   Chronic red cell microcytosis   4.   Diabetes   5.   Hypertension   6.   Family history of colon cancer   7.   Multiple polyps noted on the colonoscopy 12/19/2016   8.   Neutropenia secondary to chemotherapy   9.   Extensive peripheral adenopathy on exam 08/21/2023-CTs neck through pelvis 08/24/2023 with extensive lymphadenopathy neck, chest, abdomen and pelvis.  Scattered tiny lung nodule stable from prior examination and compatible with a benign finding. 09/08/2023-right axillary lymph node biopsy-necrotic lymph node 09/22/2023-left axillary lymph node biopsy-75% of lymphocytes with CD5/CD200 positive lambda restricted on flow cytometry, surgical otology with a CD5 positive B-cell lymphoma, CD20, PAX5, cyclin D1 negative, perforation right 5-10%, CLL/SLL favored Cycle 1 day 1 obinutuzumab  10/03/2023, day 2 10/04/2023, day 8 10/09/2023, day 15 10/16/2023 Venetoclax  10/29/2023, ramp-up schedule Obinutuzumab  and venetoclax  placed on hold 10/31/2023  Venetoclax  resumed 11/03/2023 Cycle 2 obinutuzumab  11/07/2023 Venetoclax  dose escalated to 100 mg daily 11/26/2023 Cycle 3 obinutuzumab  12/05/2023, venetoclax  continued 100 mg daily Cycle 4 obinutuzumab  01/02/2024, venetoclax  continued 100 mg daily secondary to neutropenia Cycle 5 obinutuzumab  01/30/2024, venetoclax  continued 100 mg  daily secondary to neutropenia Cycle 6 obinutuzumab  02/27/2024, venetoclax  increased to 200 mg daily Venetoclax  increased to 300 mg daily 03/26/2024   10.  Positive hepatitis B core antibody and negative surface antigen 09/28/2023-repeat labs in 4 weeks. 11.  Herpetic lip lesions 10/09/2023-Valtrex   12.  Admission 11/02/2023 with somnolence      Disposition: Angel [name] appears stable.  He will continue venetoclax  at the current dose.  He will obtain a pneumococcal 20 or 21 vaccine.  Angel [name] will return for an office and lab visit in 4 weeks.  The plan is to continue venetoclax  through November of this year.  [name], MD  08/19/2024  8:36 AM

## 2024-09-11 ENCOUNTER — Other Ambulatory Visit (HOSPITAL_COMMUNITY): Payer: Self-pay

## 2024-09-11 ENCOUNTER — Other Ambulatory Visit: Payer: Self-pay | Admitting: Oncology

## 2024-09-11 NOTE — Progress Notes (Signed)
 Specialty Pharmacy Refill Coordination Note  Angel [name] is a 73 y.o. male contacted today regarding refills of specialty medication(s) Venetoclax  (VENCLEXTA )   Patient requested [name] at [facility] at [place] date: 09/16/24   Medication will be filled on 09/13/24.

## 2024-09-12 ENCOUNTER — Other Ambulatory Visit: Payer: Self-pay

## 2024-09-12 MED ORDER — VENETOCLAX 100 MG PO TABS
300.0000 mg | ORAL_TABLET | Freq: Every day | ORAL | 0 refills | Status: DC
  Filled 2024-09-12: qty 84, 28d supply, fill #0

## 2024-09-16 ENCOUNTER — Other Ambulatory Visit: Payer: Self-pay | Admitting: Oncology

## 2024-09-16 ENCOUNTER — Other Ambulatory Visit (HOSPITAL_COMMUNITY): Payer: Self-pay

## 2024-09-16 DIAGNOSIS — C8300 Small cell B-cell lymphoma, unspecified site: Secondary | ICD-10-CM

## 2024-09-17 ENCOUNTER — Inpatient Hospital Stay (HOSPITAL_BASED_OUTPATIENT_CLINIC_OR_DEPARTMENT_OTHER): Payer: Medicare (Managed Care) | Admitting: Oncology

## 2024-09-17 ENCOUNTER — Inpatient Hospital Stay: Payer: Medicare (Managed Care)

## 2024-09-17 ENCOUNTER — Inpatient Hospital Stay: Payer: Medicare (Managed Care) | Attending: Oncology

## 2024-09-17 ENCOUNTER — Other Ambulatory Visit: Payer: Self-pay

## 2024-09-17 VITALS — BP 127/81 | HR 82 | Temp 97.8°F | Resp 18 | Ht 70.0 in | Wt 240.0 lb

## 2024-09-17 DIAGNOSIS — J3489 Other specified disorders of nose and nasal sinuses: Secondary | ICD-10-CM | POA: Diagnosis not present

## 2024-09-17 DIAGNOSIS — C9110 Chronic lymphocytic leukemia of B-cell type not having achieved remission: Secondary | ICD-10-CM | POA: Diagnosis present

## 2024-09-17 DIAGNOSIS — E119 Type 2 diabetes mellitus without complications: Secondary | ICD-10-CM | POA: Diagnosis not present

## 2024-09-17 DIAGNOSIS — J069 Acute upper respiratory infection, unspecified: Secondary | ICD-10-CM | POA: Diagnosis not present

## 2024-09-17 DIAGNOSIS — R718 Other abnormality of red blood cells: Secondary | ICD-10-CM | POA: Diagnosis not present

## 2024-09-17 DIAGNOSIS — C8300 Small cell B-cell lymphoma, unspecified site: Secondary | ICD-10-CM | POA: Diagnosis not present

## 2024-09-17 DIAGNOSIS — Z85038 Personal history of other malignant neoplasm of large intestine: Secondary | ICD-10-CM | POA: Diagnosis present

## 2024-09-17 DIAGNOSIS — I10 Essential (primary) hypertension: Secondary | ICD-10-CM | POA: Diagnosis not present

## 2024-09-17 DIAGNOSIS — Z79899 Other long term (current) drug therapy: Secondary | ICD-10-CM | POA: Diagnosis not present

## 2024-09-17 DIAGNOSIS — Z860100 Personal history of colon polyps, unspecified: Secondary | ICD-10-CM | POA: Insufficient documentation

## 2024-09-17 DIAGNOSIS — K760 Fatty (change of) liver, not elsewhere classified: Secondary | ICD-10-CM | POA: Insufficient documentation

## 2024-09-17 DIAGNOSIS — T451X5A Adverse effect of antineoplastic and immunosuppressive drugs, initial encounter: Secondary | ICD-10-CM | POA: Diagnosis not present

## 2024-09-17 DIAGNOSIS — D701 Agranulocytosis secondary to cancer chemotherapy: Secondary | ICD-10-CM | POA: Diagnosis not present

## 2024-09-17 DIAGNOSIS — R630 Anorexia: Secondary | ICD-10-CM | POA: Diagnosis not present

## 2024-09-17 DIAGNOSIS — Z800 Family history of malignant neoplasm of digestive organs: Secondary | ICD-10-CM | POA: Insufficient documentation

## 2024-09-17 LAB — CBC WITH DIFFERENTIAL (CANCER CENTER ONLY)
Abs Immature Granulocytes: 0.02 K/uL (ref 0.00–0.07)
Basophils Absolute: 0 K/uL (ref 0.0–0.1)
Basophils Relative: 0 %
Eosinophils Absolute: 0.1 K/uL (ref 0.0–0.5)
Eosinophils Relative: 2 %
HCT: 49.9 % (ref 39.0–52.0)
Hemoglobin: 15.8 g/dL (ref 13.0–17.0)
Immature Granulocytes: 1 %
Lymphocytes Relative: 44 %
Lymphs Abs: 1.3 K/uL (ref 0.7–4.0)
MCH: 24.8 pg — ABNORMAL LOW (ref 26.0–34.0)
MCHC: 31.7 g/dL (ref 30.0–36.0)
MCV: 78.3 fL — ABNORMAL LOW (ref 80.0–100.0)
Monocytes Absolute: 0.5 K/uL (ref 0.1–1.0)
Monocytes Relative: 19 %
Neutro Abs: 1 K/uL — ABNORMAL LOW (ref 1.7–7.7)
Neutrophils Relative %: 34 %
Platelet Count: 124 K/uL — ABNORMAL LOW (ref 150–400)
RBC: 6.37 MIL/uL — ABNORMAL HIGH (ref 4.22–5.81)
RDW: 17.3 % — ABNORMAL HIGH (ref 11.5–15.5)
WBC Count: 2.9 K/uL — ABNORMAL LOW (ref 4.0–10.5)
nRBC: 0 % (ref 0.0–0.2)

## 2024-09-17 LAB — CMP (CANCER CENTER ONLY)
ALT: 41 U/L (ref 0–44)
AST: 38 U/L (ref 15–41)
Albumin: 4.6 g/dL (ref 3.5–5.0)
Alkaline Phosphatase: 128 U/L — ABNORMAL HIGH (ref 38–126)
Anion gap: 12 (ref 5–15)
BUN: 8 mg/dL (ref 8–23)
CO2: 25 mmol/L (ref 22–32)
Calcium: 9.9 mg/dL (ref 8.9–10.3)
Chloride: 102 mmol/L (ref 98–111)
Creatinine: 1.09 mg/dL (ref 0.61–1.24)
GFR, Estimated: >60 mL/min (ref >60)
Glucose, Bld: 124 mg/dL — ABNORMAL HIGH (ref 70–99)
Potassium: 4 mmol/L (ref 3.5–5.1)
Sodium: 139 mmol/L (ref 135–145)
Total Bilirubin: 0.6 mg/dL (ref 0.0–1.2)
Total Protein: 7.5 g/dL (ref 6.5–8.1)

## 2024-09-17 LAB — LACTATE DEHYDROGENASE: LDH: 256 U/L — ABNORMAL HIGH (ref 98–192)

## 2024-09-17 NOTE — Progress Notes (Signed)
 [facility] OFFICE PROGRESS NOTE   Diagnosis: CLL  INTERVAL HISTORY:   Mr. Gesner returns as scheduled.  He continues venetoclax .  He had an upper respiratory infection 2 weeks ago with a cough, rhinorrhea, and anorexia.  His symptoms have improved.  He received influenza, pneumonia, and COVID-19 vaccines on 08/26/2024.  Objective:  Vital signs in last 24 hours:  Blood pressure 127/81, pulse 82, temperature 97.8 F (36.6 C), temperature source Temporal, resp. rate 18, height 5' 10 (1.778 m), weight 240 lb (108.9 kg), SpO2 100%.    HEENT: Geographic tongue, no thrush or ulcers Lymphatics: No cervical, supraclavicular, axillary, or inguinal nodes Resp: Lungs with end inspiratory rhonchi at the posterior base bilaterally, no respiratory distress Cardio: Regular rate and rhythm GI: No hepatosplenomegaly, nontender Vascular: No leg edema   Lab Results:  Lab Results  Component Value Date   WBC 2.9 (L) 09/17/2024   HGB 15.8 09/17/2024   HCT 49.9 09/17/2024   MCV 78.3 (L) 09/17/2024   PLT 124 (L) 09/17/2024   NEUTROABS 1.0 (L) 09/17/2024    CMP  Lab Results  Component Value Date   NA 140 08/19/2024   K 3.8 08/19/2024   CL 103 08/19/2024   CO2 25 08/19/2024   GLUCOSE 95 08/19/2024   BUN 13 08/19/2024   CREATININE 1.11 08/19/2024   CALCIUM  9.6 08/19/2024   PROT 7.3 08/19/2024   ALBUMIN 4.7 08/19/2024   AST 46 (H) 08/19/2024   ALT 49 (H) 08/19/2024   ALKPHOS 127 (H) 08/19/2024   BILITOT 0.7 08/19/2024   GFRNONAA >60 08/19/2024   GFRAA >60 05/08/2020    Lab Results  Component Value Date   CEA1 1.52 10/19/2020   CEA 2.09 08/21/2023    No results found for: INR, LABPROT  Imaging:  No results found.  Medications: I have reviewed the patient's current medications.   Assessment/Plan: Colon cancer, sigmoid, stage IIIa (T1,N1), status post a partial colectomy 02/02/2017 1/26 lymph nodes positive MSI-stable, no loss of mismatch repair  protein expression Staging CTs 12/29/2016-small indeterminate left lung nodules Normal preoperative CEA Cycle 1 CAPOX 03/03/2017 Cycle 2 CAPOX 03/24/2017 Cycle 3 CAPOX 04/14/2017 Cycle 4 CAPOX 05/05/2017 CTs 11/06/2018- negative for recurrent disease, small left lung nodule stable from 2007-consider benign Colonoscopy 05/29/2019-negative CTs 10/25/2019-mild increase in a 1 cm small bowel mesenteric lymph node, no evidence of recurrent disease CTs 10/20/2020-several small left-sided lung nodule stable when compared to prior CTs.  No evidence of metastatic disease in the abdomen or pelvis.  Diffuse fatty infiltration of the liver.     2.  Indeterminate left lung nodules on a chest CT 12/29/2016, stable lung nodules on CT 10/25/2019   3.   Chronic red cell microcytosis   4.   Diabetes   5.   Hypertension   6.   Family history of colon cancer   7.   Multiple polyps noted on the colonoscopy 12/19/2016   8.   Neutropenia secondary to chemotherapy   9.   Extensive peripheral adenopathy on exam 08/21/2023-CTs neck through pelvis 08/24/2023 with extensive lymphadenopathy neck, chest, abdomen and pelvis.  Scattered tiny lung nodule stable from prior examination and compatible with a benign finding. 09/08/2023-right axillary lymph node biopsy-necrotic lymph node 09/22/2023-left axillary lymph node biopsy-75% of lymphocytes with CD5/CD200 positive lambda restricted on flow cytometry, surgical otology with a CD5 positive B-cell lymphoma, CD20, PAX5, cyclin D1 negative, perforation right 5-10%, CLL/SLL favored Cycle 1 day 1 obinutuzumab  10/03/2023, day 2 10/04/2023, day 8  10/09/2023, day 15 10/16/2023 Venetoclax  10/29/2023, ramp-up schedule Obinutuzumab  and venetoclax  placed on hold 10/31/2023 Venetoclax  resumed 11/03/2023 Cycle 2 obinutuzumab  11/07/2023 Venetoclax  dose escalated to 100 mg daily 11/26/2023 Cycle 3 obinutuzumab  12/05/2023, venetoclax  continued 100 mg daily Cycle 4 obinutuzumab  01/02/2024,  venetoclax  continued 100 mg daily secondary to neutropenia Cycle 5 obinutuzumab  01/30/2024, venetoclax  continued 100 mg daily secondary to neutropenia Cycle 6 obinutuzumab  02/27/2024, venetoclax  increased to 200 mg daily Venetoclax  increased to 300 mg daily 03/26/2024   10.  Positive hepatitis B core antibody and negative surface antigen 09/28/2023-repeat labs in 4 weeks. 11.  Herpetic lip lesions 10/09/2023-Valtrex   12.  Admission 11/02/2023 with somnolence        Disposition: Mr Foti appears stable.  He is in clinical remission from CLL.  He continues venetoclax .  He has mild neutropenia secondary to venetoclax .  He will call for a fever or symptoms of an infection.  Mr wounds will return for an office and lab visit in 4 weeks.  [name], MD  09/17/2024  8:44 AM

## 2024-09-17 NOTE — Progress Notes (Signed)
 [facility] Clinical Social Work  Clinical Social Work was referred by Statistician for advance directives questions.  Clinical Social Worker contacted patient by phone to offer support and assess for needs.     Interventions: Provided education and assistance to client regarding Advanced Directives.       Follow Up Plan:  CSW will see patient on 10/28 for advance directives.     Lizbeth Sprague, LCSW  Clinical Social Worker [facility]

## 2024-09-19 ENCOUNTER — Other Ambulatory Visit (HOSPITAL_COMMUNITY): Payer: Self-pay

## 2024-09-24 ENCOUNTER — Telehealth: Payer: Self-pay

## 2024-09-24 ENCOUNTER — Inpatient Hospital Stay: Payer: Medicare (Managed Care)

## 2024-09-24 NOTE — Telephone Encounter (Signed)
 [facility] CSW Progress Note  Visual Merchandiser (CSW) contacted patient on this date to confirm 10:00 scheduled appointment for Advance Directives. Patient requested to cancel appointment due to transportation. Patient declined to reschedule at time of call. CSW provided contact information for patient to reschedule when needed.    Follow Up Plan:  No follow up scheduled.    Angel Sprague, LCSW Clinical Social Worker [facility]

## 2024-10-10 ENCOUNTER — Other Ambulatory Visit: Payer: Self-pay

## 2024-10-10 ENCOUNTER — Other Ambulatory Visit: Payer: Self-pay | Admitting: Oncology

## 2024-10-10 MED ORDER — VENETOCLAX 100 MG PO TABS
300.0000 mg | ORAL_TABLET | Freq: Every day | ORAL | 0 refills | Status: DC
  Filled 2024-10-11 (×2): qty 84, 28d supply, fill #0

## 2024-10-11 ENCOUNTER — Other Ambulatory Visit: Payer: Self-pay

## 2024-10-11 NOTE — Progress Notes (Signed)
 Specialty Pharmacy Refill Coordination Note  Angel [name] is a 73 y.o. male contacted today regarding refills of specialty medication(s) Venetoclax  (VENCLEXTA )   Patient requested Delivery   Delivery date: 10/16/24   Verified address: Patient address [address]  [place] [address]   Medication will be filled on: 10/15/24

## 2024-10-14 ENCOUNTER — Other Ambulatory Visit: Payer: Self-pay

## 2024-10-18 ENCOUNTER — Other Ambulatory Visit: Payer: Self-pay

## 2024-10-18 NOTE — Progress Notes (Signed)
 Specialty Pharmacy Ongoing Clinical Assessment Note  Angel [name] is a 73 y.o. male who is being followed by the specialty pharmacy service for RxSp Oncology   Patient's specialty medication(s) reviewed today: Venetoclax  (VENCLEXTA )   Missed doses in the last 4 weeks: 0   Patient/Caregiver did not have any additional questions or concerns.   Therapeutic benefit summary: Patient is achieving benefit   Adverse events/side effects summary: No adverse events/side effects   Patient's therapy is appropriate to: Continue    Goals Addressed             This Visit's Progress    Achieve or maintain remission   On track    Patient is on track. Patient will maintain adherence.  Per office visit note from 09/17/24, patient remains in clinical remission from CLL.           Follow up: 6 months  [facility]

## 2024-10-22 ENCOUNTER — Inpatient Hospital Stay (HOSPITAL_BASED_OUTPATIENT_CLINIC_OR_DEPARTMENT_OTHER): Payer: Medicare (Managed Care) | Admitting: Oncology

## 2024-10-22 ENCOUNTER — Other Ambulatory Visit: Payer: Self-pay | Admitting: *Deleted

## 2024-10-22 ENCOUNTER — Inpatient Hospital Stay: Payer: Medicare (Managed Care) | Attending: Oncology

## 2024-10-22 VITALS — BP 135/84 | HR 80 | Temp 97.8°F | Resp 18 | Ht 70.0 in | Wt 239.7 lb

## 2024-10-22 DIAGNOSIS — R718 Other abnormality of red blood cells: Secondary | ICD-10-CM | POA: Insufficient documentation

## 2024-10-22 DIAGNOSIS — E119 Type 2 diabetes mellitus without complications: Secondary | ICD-10-CM | POA: Diagnosis not present

## 2024-10-22 DIAGNOSIS — G8929 Other chronic pain: Secondary | ICD-10-CM | POA: Insufficient documentation

## 2024-10-22 DIAGNOSIS — C8300 Small cell B-cell lymphoma, unspecified site: Secondary | ICD-10-CM

## 2024-10-22 DIAGNOSIS — Z860100 Personal history of colon polyps, unspecified: Secondary | ICD-10-CM | POA: Insufficient documentation

## 2024-10-22 DIAGNOSIS — T451X5A Adverse effect of antineoplastic and immunosuppressive drugs, initial encounter: Secondary | ICD-10-CM | POA: Diagnosis not present

## 2024-10-22 DIAGNOSIS — C9110 Chronic lymphocytic leukemia of B-cell type not having achieved remission: Secondary | ICD-10-CM | POA: Insufficient documentation

## 2024-10-22 DIAGNOSIS — K760 Fatty (change of) liver, not elsewhere classified: Secondary | ICD-10-CM | POA: Insufficient documentation

## 2024-10-22 DIAGNOSIS — Z7969 Long term (current) use of other immunomodulators and immunosuppressants: Secondary | ICD-10-CM | POA: Diagnosis not present

## 2024-10-22 DIAGNOSIS — Z79899 Other long term (current) drug therapy: Secondary | ICD-10-CM | POA: Diagnosis not present

## 2024-10-22 DIAGNOSIS — Z85038 Personal history of other malignant neoplasm of large intestine: Secondary | ICD-10-CM | POA: Diagnosis present

## 2024-10-22 DIAGNOSIS — D701 Agranulocytosis secondary to cancer chemotherapy: Secondary | ICD-10-CM | POA: Diagnosis not present

## 2024-10-22 DIAGNOSIS — M545 Low back pain, unspecified: Secondary | ICD-10-CM | POA: Insufficient documentation

## 2024-10-22 DIAGNOSIS — I10 Essential (primary) hypertension: Secondary | ICD-10-CM | POA: Insufficient documentation

## 2024-10-22 DIAGNOSIS — Z800 Family history of malignant neoplasm of digestive organs: Secondary | ICD-10-CM | POA: Diagnosis not present

## 2024-10-22 LAB — CMP (CANCER CENTER ONLY)
ALT: 45 U/L — ABNORMAL HIGH (ref 0–44)
AST: 40 U/L (ref 15–41)
Albumin: 4.5 g/dL (ref 3.5–5.0)
Alkaline Phosphatase: 133 U/L — ABNORMAL HIGH (ref 38–126)
Anion gap: 11 (ref 5–15)
BUN: 9 mg/dL (ref 8–23)
CO2: 27 mmol/L (ref 22–32)
Calcium: 10.1 mg/dL (ref 8.9–10.3)
Chloride: 105 mmol/L (ref 98–111)
Creatinine: 1.06 mg/dL (ref 0.61–1.24)
GFR, Estimated: >60 mL/min (ref >60)
Glucose, Bld: 123 mg/dL — ABNORMAL HIGH (ref 70–99)
Potassium: 4 mmol/L (ref 3.5–5.1)
Sodium: 143 mmol/L (ref 135–145)
Total Bilirubin: 0.6 mg/dL (ref 0.0–1.2)
Total Protein: 7.6 g/dL (ref 6.5–8.1)

## 2024-10-22 LAB — CBC WITH DIFFERENTIAL (CANCER CENTER ONLY)
Abs Immature Granulocytes: 0.04 K/uL (ref 0.00–0.07)
Basophils Absolute: 0 K/uL (ref 0.0–0.1)
Basophils Relative: 0 %
Eosinophils Absolute: 0.1 K/uL (ref 0.0–0.5)
Eosinophils Relative: 2 %
HCT: 49.7 % (ref 39.0–52.0)
Hemoglobin: 15.5 g/dL (ref 13.0–17.0)
Immature Granulocytes: 1 %
Lymphocytes Relative: 46 %
Lymphs Abs: 1.4 K/uL (ref 0.7–4.0)
MCH: 24.6 pg — ABNORMAL LOW (ref 26.0–34.0)
MCHC: 31.2 g/dL (ref 30.0–36.0)
MCV: 78.9 fL — ABNORMAL LOW (ref 80.0–100.0)
Monocytes Absolute: 0.7 K/uL (ref 0.1–1.0)
Monocytes Relative: 23 %
Neutro Abs: 0.8 K/uL — ABNORMAL LOW (ref 1.7–7.7)
Neutrophils Relative %: 28 %
Platelet Count: 154 K/uL (ref 150–400)
RBC: 6.3 MIL/uL — ABNORMAL HIGH (ref 4.22–5.81)
RDW: 17.5 % — ABNORMAL HIGH (ref 11.5–15.5)
WBC Count: 3 K/uL — ABNORMAL LOW (ref 4.0–10.5)
nRBC: 0 % (ref 0.0–0.2)

## 2024-10-22 LAB — LACTATE DEHYDROGENASE: LDH: 246 U/L — ABNORMAL HIGH (ref 105–235)

## 2024-10-22 LAB — HEPATITIS B SURFACE ANTIGEN: Hepatitis B Surface Ag: NONREACTIVE

## 2024-10-22 MED ORDER — METHOCARBAMOL 750 MG PO TABS: 750.0000 mg | ORAL_TABLET | Freq: Three times a day (TID) | ORAL | 1 refills | Status: AC | PRN

## 2024-10-22 NOTE — Progress Notes (Signed)
 [facility] OFFICE PROGRESS NOTE   Diagnosis: CLL  INTERVAL HISTORY:   Angel [name] returns as scheduled.  He feels well.  No fever or night sweats.  No recent infection.  He continues venetoclax .  He has chronic low back pain.  He requests a refill on methocarbamol .  Objective:  Vital signs in last 24 hours:  Blood pressure 135/84, pulse 80, temperature 97.8 F (36.6 C), temperature source Temporal, resp. rate 18, height 5' 10 (1.778 m), weight 239 lb 11.2 oz (108.7 kg), SpO2 100%.    HEENT: No thrush or ulcers Lymphatics: No cervical, supraclavicular, axillary, or inguinal nodes Resp: Lungs clear bilaterally Cardio: Regular rate and rhythm GI: No hepatosplenomegaly Vascular: No leg edema Skin: Dry fading hyperpigmented area at the right posterolateral chest wall   Lab Results:  Lab Results  Component Value Date   WBC 3.0 (L) 10/22/2024   HGB 15.5 10/22/2024   HCT 49.7 10/22/2024   MCV 78.9 (L) 10/22/2024   PLT 154 10/22/2024   NEUTROABS 0.8 (L) 10/22/2024    CMP  Lab Results  Component Value Date   NA 139 09/17/2024   K 4.0 09/17/2024   CL 102 09/17/2024   CO2 25 09/17/2024   GLUCOSE 124 (H) 09/17/2024   BUN 8 09/17/2024   CREATININE 1.09 09/17/2024   CALCIUM  9.9 09/17/2024   PROT 7.5 09/17/2024   ALBUMIN 4.6 09/17/2024   AST 38 09/17/2024   ALT 41 09/17/2024   ALKPHOS 128 (H) 09/17/2024   BILITOT 0.6 09/17/2024   GFRNONAA >60 09/17/2024   GFRAA >60 05/08/2020    Lab Results  Component Value Date   CEA1 1.52 10/19/2020   CEA 2.09 08/21/2023    Medications: I have reviewed the patient's current medications.   Assessment/Plan:  Colon cancer, sigmoid, stage IIIa (T1,N1), status post a partial colectomy 02/02/2017 1/26 lymph nodes positive MSI-stable, no loss of mismatch repair protein expression Staging CTs 12/29/2016-small indeterminate left lung nodules Normal preoperative CEA Cycle 1 CAPOX 03/03/2017 Cycle 2 CAPOX  03/24/2017 Cycle 3 CAPOX 04/14/2017 Cycle 4 CAPOX 05/05/2017 CTs 11/06/2018- negative for recurrent disease, small left lung nodule stable from 2007-consider benign Colonoscopy 05/29/2019-negative CTs 10/25/2019-mild increase in a 1 cm small bowel mesenteric lymph node, no evidence of recurrent disease CTs 10/20/2020-several small left-sided lung nodule stable when compared to prior CTs.  No evidence of metastatic disease in the abdomen or pelvis.  Diffuse fatty infiltration of the liver.     2.  Indeterminate left lung nodules on a chest CT 12/29/2016, stable lung nodules on CT 10/25/2019   3.   Chronic red cell microcytosis   4.   Diabetes   5.   Hypertension   6.   Family history of colon cancer   7.   Multiple polyps noted on the colonoscopy 12/19/2016   8.   Neutropenia secondary to chemotherapy   9.   Extensive peripheral adenopathy on exam 08/21/2023-CTs neck through pelvis 08/24/2023 with extensive lymphadenopathy neck, chest, abdomen and pelvis.  Scattered tiny lung nodule stable from prior examination and compatible with a benign finding. 09/08/2023-right axillary lymph node biopsy-necrotic lymph node 09/22/2023-left axillary lymph node biopsy-75% of lymphocytes with CD5/CD200 positive lambda restricted on flow cytometry, surgical otology with a CD5 positive B-cell lymphoma, CD20, PAX5, cyclin D1 negative, perforation right 5-10%, CLL/SLL favored Cycle 1 day 1 obinutuzumab  10/03/2023, day 2 10/04/2023, day 8 10/09/2023, day 15 10/16/2023 Venetoclax  10/29/2023, ramp-up schedule Obinutuzumab  and venetoclax  placed on hold 10/31/2023 Venetoclax  resumed  11/03/2023 Cycle 2 obinutuzumab  11/07/2023 Venetoclax  dose escalated to 100 mg daily 11/26/2023 Cycle 3 obinutuzumab  12/05/2023, venetoclax  continued 100 mg daily Cycle 4 obinutuzumab  01/02/2024, venetoclax  continued 100 mg daily secondary to neutropenia Cycle 5 obinutuzumab  01/30/2024, venetoclax  continued 100 mg daily secondary to  neutropenia Cycle 6 obinutuzumab  02/27/2024, venetoclax  increased to 200 mg daily Venetoclax  increased to 300 mg daily 03/26/2024, last dose venetoclax  10/27/2024   10.  Positive hepatitis B core antibody and negative surface antigen 09/28/2023-repeat labs in 4 weeks. 11.  Herpetic lip lesions 10/09/2023-Valtrex   12.  Admission 11/02/2023 with somnolence        Disposition: Angel [name] is in clinical remission from CLL.  He will complete the 1 year course of venetoclax  on 10/27/2024.  He will then be followed with observation.  He has persistent neutropenia, likely secondary to venetoclax .  This should improve over the next few months.  He will return for a CBC in 1 month and an office visit in 2 months.  He will seek medical attention for a fever or symptoms of an infection.  [name], MD  10/22/2024  9:22 AM

## 2024-11-05 ENCOUNTER — Other Ambulatory Visit: Payer: Self-pay

## 2024-11-19 ENCOUNTER — Inpatient Hospital Stay: Payer: Medicare (Managed Care) | Attending: Oncology

## 2024-11-19 ENCOUNTER — Ambulatory Visit: Payer: Self-pay | Admitting: Oncology

## 2024-11-19 ENCOUNTER — Inpatient Hospital Stay: Payer: Medicare (Managed Care)

## 2024-11-19 ENCOUNTER — Other Ambulatory Visit (HOSPITAL_COMMUNITY): Payer: Self-pay

## 2024-11-19 ENCOUNTER — Other Ambulatory Visit: Payer: Self-pay | Admitting: Oncology

## 2024-11-19 DIAGNOSIS — C9110 Chronic lymphocytic leukemia of B-cell type not having achieved remission: Secondary | ICD-10-CM | POA: Diagnosis present

## 2024-11-19 DIAGNOSIS — Z85038 Personal history of other malignant neoplasm of large intestine: Secondary | ICD-10-CM | POA: Insufficient documentation

## 2024-11-19 DIAGNOSIS — C8300 Small cell B-cell lymphoma, unspecified site: Secondary | ICD-10-CM

## 2024-11-19 LAB — CBC WITH DIFFERENTIAL (CANCER CENTER ONLY)
Abs Immature Granulocytes: 0.01 K/uL (ref 0.00–0.07)
Basophils Absolute: 0 K/uL (ref 0.0–0.1)
Basophils Relative: 1 %
Eosinophils Absolute: 0.2 K/uL (ref 0.0–0.5)
Eosinophils Relative: 5 %
HCT: 50.8 % (ref 39.0–52.0)
Hemoglobin: 15.9 g/dL (ref 13.0–17.0)
Immature Granulocytes: 0 %
Lymphocytes Relative: 52 %
Lymphs Abs: 2 K/uL (ref 0.7–4.0)
MCH: 25 pg — ABNORMAL LOW (ref 26.0–34.0)
MCHC: 31.3 g/dL (ref 30.0–36.0)
MCV: 79.7 fL — ABNORMAL LOW (ref 80.0–100.0)
Monocytes Absolute: 0.5 K/uL (ref 0.1–1.0)
Monocytes Relative: 13 %
Neutro Abs: 1.1 K/uL — ABNORMAL LOW (ref 1.7–7.7)
Neutrophils Relative %: 29 %
Platelet Count: 133 K/uL — ABNORMAL LOW (ref 150–400)
RBC: 6.37 MIL/uL — ABNORMAL HIGH (ref 4.22–5.81)
RDW: 17.6 % — ABNORMAL HIGH (ref 11.5–15.5)
WBC Count: 3.9 K/uL — ABNORMAL LOW (ref 4.0–10.5)
nRBC: 0 % (ref 0.0–0.2)

## 2024-11-20 ENCOUNTER — Other Ambulatory Visit (HOSPITAL_COMMUNITY): Payer: Self-pay

## 2024-11-20 NOTE — Telephone Encounter (Signed)
-----   Message from [name], MD sent at 11/19/2024  7:43 PM EST ----- Please call patient, the neutrophiil count remains mildly low and stable , call for a fever, f/u as scheduled

## 2024-11-20 NOTE — Telephone Encounter (Signed)
 Patient voiced understanding and to call if symptoms of a fever occurs, no further questions.

## 2024-11-25 ENCOUNTER — Other Ambulatory Visit: Payer: Self-pay

## 2024-11-27 ENCOUNTER — Other Ambulatory Visit (HOSPITAL_COMMUNITY): Payer: Self-pay

## 2024-11-29 ENCOUNTER — Other Ambulatory Visit: Payer: Self-pay

## 2024-12-23 ENCOUNTER — Telehealth: Payer: Self-pay

## 2024-12-23 NOTE — Telephone Encounter (Signed)
 Spoke directly with patient about rescheduling his Tuesday 12/24/24 appointment to Thursday 12/26/24 at 1300, he was agreeable and verbalized understanding.

## 2024-12-24 ENCOUNTER — Other Ambulatory Visit: Payer: Self-pay

## 2024-12-24 ENCOUNTER — Inpatient Hospital Stay: Payer: Medicare (Managed Care) | Admitting: Oncology

## 2024-12-24 ENCOUNTER — Inpatient Hospital Stay: Payer: Medicare (Managed Care) | Admitting: Nurse Practitioner

## 2024-12-24 ENCOUNTER — Inpatient Hospital Stay: Payer: Medicare (Managed Care) | Attending: Oncology

## 2024-12-26 ENCOUNTER — Other Ambulatory Visit: Payer: Self-pay | Admitting: *Deleted

## 2024-12-26 ENCOUNTER — Encounter: Payer: Self-pay | Admitting: Nurse Practitioner

## 2024-12-26 ENCOUNTER — Inpatient Hospital Stay: Attending: Oncology

## 2024-12-26 ENCOUNTER — Inpatient Hospital Stay (HOSPITAL_BASED_OUTPATIENT_CLINIC_OR_DEPARTMENT_OTHER): Admitting: Nurse Practitioner

## 2024-12-26 ENCOUNTER — Other Ambulatory Visit: Payer: Self-pay

## 2024-12-26 VITALS — BP 139/84 | HR 81 | Temp 98.2°F | Resp 18 | Ht 70.0 in | Wt 245.2 lb

## 2024-12-26 DIAGNOSIS — C8300 Small cell B-cell lymphoma, unspecified site: Secondary | ICD-10-CM

## 2024-12-26 LAB — CMP (CANCER CENTER ONLY)
ALT: 46 U/L — ABNORMAL HIGH (ref 0–44)
AST: 47 U/L — ABNORMAL HIGH (ref 15–41)
Albumin: 4.7 g/dL (ref 3.5–5.0)
Alkaline Phosphatase: 131 U/L — ABNORMAL HIGH (ref 38–126)
Anion gap: 13 (ref 5–15)
BUN: 14 mg/dL (ref 8–23)
CO2: 27 mmol/L (ref 22–32)
Calcium: 10 mg/dL (ref 8.9–10.3)
Chloride: 100 mmol/L (ref 98–111)
Creatinine: 1.13 mg/dL (ref 0.61–1.24)
GFR, Estimated: >60 mL/min
Glucose, Bld: 124 mg/dL — ABNORMAL HIGH (ref 70–99)
Potassium: 4.3 mmol/L (ref 3.5–5.1)
Sodium: 140 mmol/L (ref 135–145)
Total Bilirubin: 0.6 mg/dL (ref 0.0–1.2)
Total Protein: 7.8 g/dL (ref 6.5–8.1)

## 2024-12-26 LAB — CBC WITH DIFFERENTIAL (CANCER CENTER ONLY)
Abs Immature Granulocytes: 0.02 10*3/uL (ref 0.00–0.07)
Basophils Absolute: 0 10*3/uL (ref 0.0–0.1)
Basophils Relative: 0 %
Eosinophils Absolute: 0.1 10*3/uL (ref 0.0–0.5)
Eosinophils Relative: 2 %
HCT: 52.3 % — ABNORMAL HIGH (ref 39.0–52.0)
Hemoglobin: 16.6 g/dL (ref 13.0–17.0)
Immature Granulocytes: 0 %
Lymphocytes Relative: 53 %
Lymphs Abs: 2.6 10*3/uL (ref 0.7–4.0)
MCH: 25.2 pg — ABNORMAL LOW (ref 26.0–34.0)
MCHC: 31.7 g/dL (ref 30.0–36.0)
MCV: 79.4 fL — ABNORMAL LOW (ref 80.0–100.0)
Monocytes Absolute: 0.7 10*3/uL (ref 0.1–1.0)
Monocytes Relative: 13 %
Neutro Abs: 1.6 10*3/uL — ABNORMAL LOW (ref 1.7–7.7)
Neutrophils Relative %: 32 %
Platelet Count: 131 10*3/uL — ABNORMAL LOW (ref 150–400)
RBC: 6.59 MIL/uL — ABNORMAL HIGH (ref 4.22–5.81)
RDW: 17.2 % — ABNORMAL HIGH (ref 11.5–15.5)
WBC Count: 5.1 10*3/uL (ref 4.0–10.5)
nRBC: 0 % (ref 0.0–0.2)

## 2024-12-26 LAB — LACTATE DEHYDROGENASE: LDH: 229 U/L (ref 105–235)

## 2024-12-26 NOTE — Progress Notes (Signed)
 " [facility] OFFICE PROGRESS NOTE   Diagnosis: CLL  INTERVAL HISTORY:   Mr. Sliter returns as scheduled.  He completed the 1 year course of venetoclax  on 10/27/2024.  Overall he feels well.  No fevers or sweats.  No enlarged lymph nodes.  He has a good appetite.  He reports weight is stable.  Normal knee and low back pain.  He denies bleeding.  He reports his sister recently passed away.  Objective:  Vital signs in last 24 hours:  Blood pressure 139/84, pulse 81, temperature 98.2 F (36.8 C), temperature source Temporal, resp. rate 18, height 5' 10 (1.778 m), weight 245 lb 3.2 oz (111.2 kg), SpO2 99%.    Lymphatics: No palpable cervical, supraclavicular, axillary or inguinal lymph nodes. Resp: Lungs clear bilaterally. Cardio: Regular rate and rhythm. GI: Abdomen soft and nontender.  No hepatosplenomegaly. Vascular: No leg edema.     Lab Results:  Lab Results  Component Value Date   WBC 5.1 12/26/2024   HGB 16.6 12/26/2024   HCT 52.3 (H) 12/26/2024   MCV 79.4 (L) 12/26/2024   PLT 131 (L) 12/26/2024   NEUTROABS 1.6 (L) 12/26/2024    Imaging:  No results found.  Medications: I have reviewed the patient's current medications.  Assessment/Plan: Colon cancer, sigmoid, stage IIIa (T1,N1), status post a partial colectomy 02/02/2017 1/26 lymph nodes positive MSI-stable, no loss of mismatch repair protein expression Staging CTs 12/29/2016-small indeterminate left lung nodules Normal preoperative CEA Cycle 1 CAPOX 03/03/2017 Cycle 2 CAPOX 03/24/2017 Cycle 3 CAPOX 04/14/2017 Cycle 4 CAPOX 05/05/2017 CTs 11/06/2018- negative for recurrent disease, small left lung nodule stable from 2007-consider benign Colonoscopy 05/29/2019-negative CTs 10/25/2019-mild increase in a 1 cm small bowel mesenteric lymph node, no evidence of recurrent disease CTs 10/20/2020-several small left-sided lung nodule stable when compared to prior CTs.  No evidence of metastatic  disease in the abdomen or pelvis.  Diffuse fatty infiltration of the liver.     2.  Indeterminate left lung nodules on a chest CT 12/29/2016, stable lung nodules on CT 10/25/2019   3.   Chronic red cell microcytosis   4.   Diabetes   5.   Hypertension   6.   Family history of colon cancer   7.   Multiple polyps noted on the colonoscopy 12/19/2016   8.   Neutropenia secondary to chemotherapy   9.   Extensive peripheral adenopathy on exam 08/21/2023-CTs neck through pelvis 08/24/2023 with extensive lymphadenopathy neck, chest, abdomen and pelvis.  Scattered tiny lung nodule stable from prior examination and compatible with a benign finding. 09/08/2023-right axillary lymph node biopsy-necrotic lymph node 09/22/2023-left axillary lymph node biopsy-75% of lymphocytes with CD5/CD200 positive lambda restricted on flow cytometry, surgical otology with a CD5 positive B-cell lymphoma, CD20, PAX5, cyclin D1 negative, perforation right 5-10%, CLL/SLL favored Cycle 1 day 1 obinutuzumab  10/03/2023, day 2 10/04/2023, day 8 10/09/2023, day 15 10/16/2023 Venetoclax  10/29/2023, ramp-up schedule Obinutuzumab  and venetoclax  placed on hold 10/31/2023 Venetoclax  resumed 11/03/2023 Cycle 2 obinutuzumab  11/07/2023 Venetoclax  dose escalated to 100 mg daily 11/26/2023 Cycle 3 obinutuzumab  12/05/2023, venetoclax  continued 100 mg daily Cycle 4 obinutuzumab  01/02/2024, venetoclax  continued 100 mg daily secondary to neutropenia Cycle 5 obinutuzumab  01/30/2024, venetoclax  continued 100 mg daily secondary to neutropenia Cycle 6 obinutuzumab  02/27/2024, venetoclax  increased to 200 mg daily Venetoclax  increased to 300 mg daily 03/26/2024, last dose venetoclax  10/27/2024   10.  Positive hepatitis B core antibody and negative surface antigen 09/28/2023-repeat labs in 4 weeks. 11.  Herpetic  lip lesions 10/09/2023-Valtrex   12.  Admission 11/02/2023 with somnolence      Disposition: Mr. Twedt appears stable.  He completed the  course of venetoclax  10/27/2024.  He remains in clinical remission from CLL.  Plan to continue to follow on an observation approach.  We reviewed the labs from today.  Neutropenia is slowly improving.  He has stable mild thrombocytopenia.  He understands to contact the office with fever, chills, other signs of infection, bleeding.  We will contact Dr. Janett office regarding timing of next surveillance colonoscopy.  He will return for lab and follow-up in 2 months.  We are available to see him sooner if needed.    Olam Ned ANP/GNP-BC   12/26/2024  1:39 PM  Addendum-per [facility] he had a colonoscopy October 2023.  The report is being faxed and will be scanned to his chart.      "

## 2024-12-27 ENCOUNTER — Other Ambulatory Visit: Payer: Self-pay

## 2024-12-27 ENCOUNTER — Other Ambulatory Visit: Payer: Self-pay | Admitting: Pharmacy Technician

## 2024-12-27 NOTE — Progress Notes (Signed)
 Disenrolled; Patient completed course on 10/27/2024. [facility] Beth checked chart and agree to disenroll.

## 2025-02-25 ENCOUNTER — Inpatient Hospital Stay

## 2025-02-25 ENCOUNTER — Inpatient Hospital Stay: Admitting: Oncology
